# Patient Record
Sex: Male | Born: 1950
Health system: Southern US, Community
[De-identification: ages and names within clinical notes are randomized; demographics above are authoritative.]

## PROBLEM LIST (undated history)

## (undated) DIAGNOSIS — I10 Essential (primary) hypertension: Secondary | ICD-10-CM

## (undated) DIAGNOSIS — I8390 Asymptomatic varicose veins of unspecified lower extremity: Secondary | ICD-10-CM

## (undated) DIAGNOSIS — R1013 Epigastric pain: Secondary | ICD-10-CM

## (undated) DIAGNOSIS — R140 Abdominal distension (gaseous): Secondary | ICD-10-CM

## (undated) DIAGNOSIS — M159 Polyosteoarthritis, unspecified: Secondary | ICD-10-CM

## (undated) DIAGNOSIS — I700 Atherosclerosis of aorta: Secondary | ICD-10-CM

## (undated) DIAGNOSIS — R0902 Hypoxemia: Secondary | ICD-10-CM

## (undated) DIAGNOSIS — J449 Chronic obstructive pulmonary disease, unspecified: Secondary | ICD-10-CM

## (undated) DIAGNOSIS — M1990 Unspecified osteoarthritis, unspecified site: Secondary | ICD-10-CM

## (undated) HISTORY — DX: Epigastric pain: R10.13

## (undated) HISTORY — DX: Polyosteoarthritis, unspecified: M15.9

## (undated) HISTORY — DX: Atherosclerosis of aorta: I70.0

## (undated) HISTORY — DX: Unspecified osteoarthritis, unspecified site: M19.90

## (undated) HISTORY — DX: Abdominal distension (gaseous): R14.0

## (undated) HISTORY — DX: Asymptomatic varicose veins of unspecified lower extremity: I83.90

## (undated) HISTORY — DX: Chronic obstructive pulmonary disease, unspecified: J44.9

## (undated) HISTORY — DX: Hypoxemia: R09.02

## (undated) HISTORY — PX: OTHER SURGICAL HISTORY: SHX169

## (undated) HISTORY — DX: Essential (primary) hypertension: I10

---

## 2010-04-28 ENCOUNTER — Emergency Department (HOSPITAL_COMMUNITY): Payer: BC Managed Care – PPO

## 2010-04-28 ENCOUNTER — Emergency Department (HOSPITAL_COMMUNITY)
Admission: EM | Admit: 2010-04-28 | Discharge: 2010-04-28 | Disposition: A | Discharge status: Home / Self Care | Payer: BC Managed Care – PPO | Attending: Emergency Medicine | Admitting: Emergency Medicine

## 2010-04-28 DIAGNOSIS — Z7982 Long term (current) use of aspirin: Secondary | ICD-10-CM | POA: Insufficient documentation

## 2010-04-28 DIAGNOSIS — Z86718 Personal history of other venous thrombosis and embolism: Secondary | ICD-10-CM | POA: Insufficient documentation

## 2010-04-28 DIAGNOSIS — R55 Syncope and collapse: Secondary | ICD-10-CM | POA: Insufficient documentation

## 2010-04-28 DIAGNOSIS — Z79899 Other long term (current) drug therapy: Secondary | ICD-10-CM | POA: Insufficient documentation

## 2010-04-28 DIAGNOSIS — M129 Arthropathy, unspecified: Secondary | ICD-10-CM | POA: Insufficient documentation

## 2010-04-28 DIAGNOSIS — R42 Dizziness and giddiness: Secondary | ICD-10-CM | POA: Insufficient documentation

## 2010-04-28 LAB — CBC
MCH: 31.6 pg (ref 26.0–34.0)
MCHC: 35.3 g/dL (ref 30.0–36.0)
MCV: 89.7 fL (ref 78.0–100.0)
Platelets: 253 10*3/uL (ref 150–400)
RDW: 12.7 % (ref 11.5–15.5)

## 2010-04-28 LAB — POCT I-STAT, CHEM 8
BUN: 17 mg/dL (ref 6–23)
Calcium, Ion: 1.22 mmol/L (ref 1.12–1.32)
Chloride: 105 mEq/L (ref 96–112)
Glucose, Bld: 80 mg/dL (ref 70–99)
TCO2: 31 mmol/L (ref 0–100)

## 2010-04-28 LAB — DIFFERENTIAL
Basophils Relative: 0 % (ref 0–1)
Eosinophils Absolute: 0.1 10*3/uL (ref 0.0–0.7)
Eosinophils Relative: 1 % (ref 0–5)
Lymphs Abs: 1.9 10*3/uL (ref 0.7–4.0)
Monocytes Relative: 7 % (ref 3–12)

## 2011-12-21 ENCOUNTER — Institutional Professional Consult (permissible substitution): Payer: BC Managed Care – PPO | Admitting: Critical Care Medicine

## 2012-01-04 ENCOUNTER — Encounter: Payer: Self-pay | Admitting: *Deleted

## 2012-01-04 ENCOUNTER — Ambulatory Visit (INDEPENDENT_AMBULATORY_CARE_PROVIDER_SITE_OTHER)
Admission: RE | Admit: 2012-01-04 | Discharge: 2012-01-04 | Disposition: A | Discharge status: Home / Self Care | Payer: BC Managed Care – PPO | Source: Ambulatory Visit | Attending: Critical Care Medicine | Admitting: Critical Care Medicine

## 2012-01-04 ENCOUNTER — Ambulatory Visit (INDEPENDENT_AMBULATORY_CARE_PROVIDER_SITE_OTHER): Payer: BC Managed Care – PPO | Admitting: Critical Care Medicine

## 2012-01-04 VITALS — BP 130/66 | HR 73 | Temp 97.7°F | Ht 68.0 in | Wt 170.2 lb

## 2012-01-04 DIAGNOSIS — J441 Chronic obstructive pulmonary disease with (acute) exacerbation: Secondary | ICD-10-CM

## 2012-01-04 DIAGNOSIS — Z720 Tobacco use: Secondary | ICD-10-CM

## 2012-01-04 DIAGNOSIS — I8390 Asymptomatic varicose veins of unspecified lower extremity: Secondary | ICD-10-CM | POA: Insufficient documentation

## 2012-01-04 DIAGNOSIS — F17201 Nicotine dependence, unspecified, in remission: Secondary | ICD-10-CM

## 2012-01-04 DIAGNOSIS — F17200 Nicotine dependence, unspecified, uncomplicated: Secondary | ICD-10-CM

## 2012-01-04 DIAGNOSIS — J449 Chronic obstructive pulmonary disease, unspecified: Secondary | ICD-10-CM | POA: Insufficient documentation

## 2012-01-04 DIAGNOSIS — I10 Essential (primary) hypertension: Secondary | ICD-10-CM | POA: Insufficient documentation

## 2012-01-04 DIAGNOSIS — M1990 Unspecified osteoarthritis, unspecified site: Secondary | ICD-10-CM | POA: Insufficient documentation

## 2012-01-04 HISTORY — DX: Nicotine dependence, unspecified, in remission: F17.201

## 2012-01-04 MED ORDER — TIOTROPIUM BROMIDE MONOHYDRATE 18 MCG IN CAPS: 18.0000 ug | ORAL_CAPSULE | Freq: Every day | RESPIRATORY_TRACT | Status: DC

## 2012-01-04 MED ORDER — AZITHROMYCIN 250 MG PO TABS: 250.0000 mg | ORAL_TABLET | Freq: Every day | ORAL | Status: DC

## 2012-01-04 MED ORDER — PREDNISONE 10 MG PO TABS: ORAL_TABLET | ORAL | Status: DC

## 2012-01-04 MED ORDER — MOMETASONE FURO-FORMOTEROL FUM 200-5 MCG/ACT IN AERO: 2.0000 | INHALATION_SPRAY | Freq: Two times a day (BID) | RESPIRATORY_TRACT | Status: DC

## 2012-01-04 NOTE — Patient Instructions (Addendum)
Start Spiriva Stop  Advair  Start Dulera two puff twice daily Stop ceftin Start Azithromycin 250mg  Take two once then one daily until gone Chest xray today An overnight sleep oximetry will be obtained from [facility] We may need to start oxygen  Return 7days with recheck with tammy parrett our NP  Obtain flu vaccine and pneumovax on return See DR Delford Field in 6 weeks

## 2012-01-04 NOTE — Progress Notes (Addendum)
Subjective:    Patient ID: Gilbert Jefferson, male    DOB: 06/20/1950, 61 y.o.   MRN: [id number]  HPI Comments: Dx Copd x 3months, dyspnea x 1 yr  Shortness of Breath This is a chronic problem. The current episode started more than 1 year ago. The problem occurs daily (exertional only, when first awakens, up stairs). The problem has been gradually worsening. Associated symptoms include rhinorrhea, sputum production and wheezing. Pertinent negatives include no abdominal pain, chest pain, claudication, coryza, ear pain, fever, headaches, hemoptysis, leg pain, leg swelling, neck pain, orthopnea, PND, rash, sore throat, swollen glands, syncope or vomiting. The symptoms are aggravated by URIs, smoke, weather changes, any activity, exercise and lying flat. Risk factors include smoking. He has tried beta agonist inhalers and steroid inhalers (advair ) for the symptoms. The treatment provided moderate relief. His past medical history is significant for COPD. There is no history of allergies, aspirin allergies, asthma, bronchiolitis, CAD, chronic lung disease, DVT, a heart failure, PE, pneumonia or a recent surgery.   Past Medical History  Diagnosis Date  . Hypertension   . COPD (chronic obstructive pulmonary disease)   . Osteoarthrosis   . Varicose veins      No family history on file.   History   Social History  . Marital Status: Married    Spouse Name: N/A    Number of Children: 6  . Years of Education: N/A   Occupational History  . [employer]   Social History Main Topics  . Smoking status: Former Smoker -- 1.5 packs/day for 45 years    Types: Cigarettes    Quit date: 01/02/2012  . Smokeless tobacco: Never Used  . Alcohol Use: No  . Drug Use: No  . Sexually Active: Not on file   Other Topics Concern  . Not on file   Social History Narrative  . No narrative on file     No Known Allergies   No outpatient prescriptions prior to visit.    Last reviewed on  01/04/2012  3:35 PM by Storm Frisk, MD  CAT Score 01/04/2012  Total CAT Score 22        Review of Systems  Constitutional: Negative for fever, chills, diaphoresis, activity change, appetite change, fatigue and unexpected weight change.  HENT: Positive for congestion and rhinorrhea. Negative for hearing loss, ear pain, nosebleeds, sore throat, facial swelling, sneezing, mouth sores, trouble swallowing, neck pain, neck stiffness, dental problem, voice change, postnasal drip, sinus pressure, tinnitus and ear discharge.   Eyes: Negative for photophobia, discharge, itching and visual disturbance.  Respiratory: Positive for cough, sputum production, chest tightness, shortness of breath and wheezing. Negative for apnea, hemoptysis, choking and stridor.   Cardiovascular: Negative for chest pain, palpitations, orthopnea, claudication, leg swelling, syncope and PND.  Gastrointestinal: Negative for nausea, vomiting, abdominal pain, constipation, blood in stool and abdominal distention.  Genitourinary: Negative for dysuria, urgency, frequency, hematuria, flank pain, decreased urine volume and difficulty urinating.  Musculoskeletal: Positive for gait problem. Negative for myalgias, back pain, joint swelling and arthralgias.  Skin: Negative for color change, pallor and rash.  Neurological: Positive for dizziness and tremors. Negative for seizures, syncope, speech difficulty, weakness, light-headedness, numbness and headaches.  Hematological: Negative for adenopathy. Does not bruise/bleed easily.  Psychiatric/Behavioral: Negative for confusion, sleep disturbance and agitation. The patient is not nervous/anxious.        Objective:   Physical Exam Filed Vitals:   01/04/12 1517  BP:  130/66  Pulse: 73  Temp: 97.7 F (36.5 C)  TempSrc: Oral  Height: 5\' 8"  (1.727 m)  Weight: 170 lb 3.2 oz (77.202 kg)  SpO2: 95%    Gen: Pleasant, well-nourished, in no distress,  normal affect  ENT: No lesions,   mouth clear,  oropharynx clear, no postnasal drip  Neck: No JVD, no TMG, no carotid bruits  Lungs: No use of accessory muscles, no dullness to percussion, distant BS, exp wheeze  Cardiovascular: RRR, heart sounds normal, no murmur or gallops, no peripheral edema  Abdomen: soft and NT, no HSM,  BS normal  Musculoskeletal: No deformities, no cyanosis or clubbing  Neuro: alert, non focal  Skin: Warm, no lesions or rashes  CXR 12/6: copd changes  Cleda Daub 10/13:  FeV1 30%  FeV1/FVC 45%           Assessment & Plan:   COPD (chronic obstructive pulmonary disease) Golds C Copd Pt just now stopping smoking  Plan Check alpha one antitrypsin assay Start Spiriva Stop  Advair  Start Dulera two puff twice daily Stop ceftin Start Azithromycin 250mg  Take two once then one daily until gone An overnight sleep oximetry will be obtained from [facility] We may need to start oxygen  Return 7days with recheck with tammy parrett our NP  Obtain flu vaccine and pneumovax on return See DR Delford Field in 6 weeks   Tobacco abuse Pt given of intense smoking cessation counselling   Updated Medication List Outpatient Encounter Prescriptions as of 01/04/2012  Medication Sig Dispense Refill  . aspirin (BAYER ASPIRIN) 325 MG tablet Take 325 mg by mouth daily.      Marland Kitchen buPROPion (WELLBUTRIN SR) 150 MG 12 hr tablet Take 150 mg by mouth 2 (two) times daily.      Marland Kitchen etodolac (LODINE) 500 MG tablet Take 500 mg by mouth 2 (two) times daily.      . nicotine (NICODERM CQ - DOSED IN MG/24 HOURS) 21 mg/24hr patch Place 1 patch onto the skin daily.      . [DISCONTINUED] ADVAIR DISKUS 250-50 MCG/DOSE AEPB Inhale 1 puff into the lungs Twice daily.      . [DISCONTINUED] cefUROXime (CEFTIN) 250 MG tablet Take 1 tablet by mouth Twice daily.      Marland Kitchen azithromycin (ZITHROMAX) 250 MG tablet Take 1 tablet (250 mg total) by mouth daily. Take two once then one daily until gone  6 each  0  . mometasone-formoterol (DULERA) 200-5  MCG/ACT AERO Inhale 2 puffs into the lungs 2 (two) times daily.  1 Inhaler  6  . predniSONE (DELTASONE) 10 MG tablet Take 4 tablets daily for 5 days then stop  20 tablet  0  . tiotropium (SPIRIVA HANDIHALER) 18 MCG inhalation capsule Place 1 capsule (18 mcg total) into inhaler and inhale daily.  30 capsule  6

## 2012-01-06 NOTE — Assessment & Plan Note (Signed)
Pt given of intense smoking cessation counselling

## 2012-01-06 NOTE — Progress Notes (Signed)
Quick Note:  Notify the patient that the Xray is stable and no pneumonia, Just Copd changes No change in medications are recommended. Continue current meds as prescribed at last office visit ______

## 2012-01-06 NOTE — Assessment & Plan Note (Addendum)
Golds C Copd Pt just now stopping smoking  Plan Check alpha one antitrypsin assay Start Spiriva Stop  Advair  Start Dulera two puff twice daily Stop ceftin Start Azithromycin 250mg  Take two once then one daily until gone An overnight sleep oximetry will be obtained from [facility] We may need to start oxygen  Return 7days with recheck with tammy parrett our NP  Obtain flu vaccine and pneumovax on return See DR Delford Field in 6 weeks

## 2012-01-07 ENCOUNTER — Telehealth: Payer: Self-pay | Admitting: *Deleted

## 2012-01-07 NOTE — Telephone Encounter (Signed)
Called and spoke with patient, patient is aware that he needs to come in to the lab for alpha 1 testing.  Patient states that he would be able to come in 01/08/12 to have this done.  Crystal has already placed orders.  I have also given patient results of cxr. Verbalized understanding of this.  Patient had question on how to use spiriva--states he had been using it twice a day.  Informed patient that spiriva is to be used once a day.  Patient verbalized understanding of this and nothing further needed at this time.

## 2012-01-07 NOTE — Progress Notes (Signed)
Quick Note:  Spoke with patient and informed him of results/recs as listed below per Dr. Delford Field. Patient verbalized understanding and will come in for alpha 1 testing 01/08/12. Nothing further at this time. ______

## 2012-01-07 NOTE — Telephone Encounter (Signed)
Message copied by Valentino Hue on Mon Jan 07, 2012  8:47 AM ------      Message from: Storm Frisk      Created: Sun Jan 06, 2012  7:10 PM       Call th pt.  He needs to come back in for alpha one antitrypsin level blood draw d/t severe copd

## 2012-01-07 NOTE — Telephone Encounter (Signed)
Called, spoke with family member.  She will have pt return call.  Also, need to inform him of cxr results.

## 2012-01-07 NOTE — Progress Notes (Signed)
Quick Note:  Left msg with family member to have pt call office back. ______

## 2012-01-08 ENCOUNTER — Other Ambulatory Visit: Payer: BC Managed Care – PPO

## 2012-01-08 DIAGNOSIS — J441 Chronic obstructive pulmonary disease with (acute) exacerbation: Secondary | ICD-10-CM

## 2012-01-09 ENCOUNTER — Encounter: Payer: Self-pay | Admitting: Adult Health

## 2012-01-09 ENCOUNTER — Ambulatory Visit (INDEPENDENT_AMBULATORY_CARE_PROVIDER_SITE_OTHER): Payer: BC Managed Care – PPO | Admitting: Adult Health

## 2012-01-09 VITALS — BP 120/74 | HR 74 | Temp 97.2°F | Ht 68.0 in | Wt 170.8 lb

## 2012-01-09 DIAGNOSIS — J449 Chronic obstructive pulmonary disease, unspecified: Secondary | ICD-10-CM

## 2012-01-09 DIAGNOSIS — Z23 Encounter for immunization: Secondary | ICD-10-CM

## 2012-01-09 NOTE — Patient Instructions (Addendum)
Continue on  Spiriva 1 puff daily  Continue on  Dulera two puff twice daily  We will call regarding  overnight sleep oximetry   flu vaccine and pneumovax  See DR Delford Field in 6 weeks as planned and As needed   Please contact office for sooner follow up if symptoms do not improve or worsen or seek emergency care '

## 2012-01-10 DIAGNOSIS — Z23 Encounter for immunization: Secondary | ICD-10-CM

## 2012-01-14 LAB — ALPHA-1 ANTITRYPSIN PHENOTYPE: A-1 Antitrypsin: 152 mg/dL (ref 83–199)

## 2012-01-15 NOTE — Assessment & Plan Note (Signed)
Recent exacerbation   Plan  Continue on  Spiriva 1 puff daily  Continue on  Dulera two puff twice daily  We will call regarding  overnight sleep oximetry   flu vaccine and pneumovax  See DR Delford Field in 6 weeks as planned and As needed   Please contact office for sooner follow up if symptoms do not improve or worsen or seek emergency care

## 2012-01-15 NOTE — Progress Notes (Signed)
  Subjective:    Patient ID: Gilbert Jefferson, male    DOB: 02/18/1950, 61 y.o.   MRN: [id number]  HPI 61 yo male seen for initial pulmonary consult 01/04/12 for COPD  CXR 12/6: copd changes Cleda Daub 10/13:  FeV1 30%  FeV1/FVC 45%    CAT 22   01/09/12 Follow up  Pt reports he feels a lot better, dulera and spiriva working well, has not had ONO done (hasnt called) no other complaints at this time Seen last week for consult, having COPD flare . Tx w/ Zpack .  Returns today feeling much better  Started on Dulera and spiriva .     Review of Systems Constitutional:   No  weight loss, night sweats,  Fevers, chills,  +fatigue, or  lassitude.  HEENT:   No headaches,  Difficulty swallowing,  Tooth/dental problems, or  Sore throat,                No sneezing, itching, ear ache, nasal congestion, post nasal drip,   CV:  No chest pain,  Orthopnea, PND, swelling in lower extremities, anasarca, dizziness, palpitations, syncope.   GI  No heartburn, indigestion, abdominal pain, nausea, vomiting, diarrhea, change in bowel habits, loss of appetite, bloody stools.   Resp:    No coughing up of blood.     No chest wall deformity  Skin: no rash or lesions.  GU: no dysuria, change in color of urine, no urgency or frequency.  No flank pain, no hematuria   MS:  No joint pain or swelling.  No decreased range of motion.  No back pain.  Psych:  No change in mood or affect. No depression or anxiety.  No memory loss.         Objective:   Physical Exam GEN: A/Ox3; pleasant , NAD  HEENT:  [place]/AT,  EACs-clear, TMs-wnl, NOSE-clear, THROAT-clear, no lesions, no postnasal drip or exudate noted.   NECK:  Supple w/ fair ROM; no JVD; normal carotid impulses w/o bruits; no thyromegaly or nodules palpated; no lymphadenopathy.  RESP  Diminished BS in bases no accessory muscle use, no dullness to percussion  CARD:  RRR, no m/r/g  , no peripheral edema, pulses intact, no cyanosis or clubbing.  GI:   Soft & nt; nml  bowel sounds; no organomegaly or masses detected.  Musco: Warm bil, no deformities or joint swelling noted.   Neuro: alert, no focal deficits noted.    Skin: Warm, no lesions or rashes         Assessment & Plan:

## 2012-01-18 ENCOUNTER — Telehealth: Payer: Self-pay | Admitting: Critical Care Medicine

## 2012-01-18 DIAGNOSIS — J449 Chronic obstructive pulmonary disease, unspecified: Secondary | ICD-10-CM

## 2012-01-18 NOTE — Telephone Encounter (Signed)
Called, spoke with pt.  Informed him of ONO results and recs per PW.  He verbalized understanding and voiced no further questions or concerns at this time.

## 2012-01-18 NOTE — Telephone Encounter (Signed)
Call pt and tell him ONO was normal, No oxygen needed at night

## 2012-01-29 ENCOUNTER — Other Ambulatory Visit: Payer: Self-pay | Admitting: Critical Care Medicine

## 2012-01-29 DIAGNOSIS — J441 Chronic obstructive pulmonary disease with (acute) exacerbation: Secondary | ICD-10-CM

## 2012-01-29 MED ORDER — TIOTROPIUM BROMIDE MONOHYDRATE 18 MCG IN CAPS: 18.0000 ug | ORAL_CAPSULE | Freq: Every day | RESPIRATORY_TRACT | Status: DC

## 2012-01-29 NOTE — Telephone Encounter (Signed)
Pt gets ,[facility] day supply needed for  spiriva rx sent.

## 2012-02-05 ENCOUNTER — Encounter: Payer: Self-pay | Admitting: Critical Care Medicine

## 2012-02-22 ENCOUNTER — Ambulatory Visit (INDEPENDENT_AMBULATORY_CARE_PROVIDER_SITE_OTHER): Payer: BC Managed Care – PPO | Admitting: Critical Care Medicine

## 2012-02-22 ENCOUNTER — Encounter: Payer: Self-pay | Admitting: Critical Care Medicine

## 2012-02-22 VITALS — BP 134/60 | HR 72 | Temp 97.8°F | Ht 68.0 in | Wt 186.0 lb

## 2012-02-22 DIAGNOSIS — J441 Chronic obstructive pulmonary disease with (acute) exacerbation: Secondary | ICD-10-CM

## 2012-02-22 DIAGNOSIS — F17200 Nicotine dependence, unspecified, uncomplicated: Secondary | ICD-10-CM

## 2012-02-22 DIAGNOSIS — Z720 Tobacco use: Secondary | ICD-10-CM

## 2012-02-22 DIAGNOSIS — J449 Chronic obstructive pulmonary disease, unspecified: Secondary | ICD-10-CM

## 2012-02-22 MED ORDER — TIOTROPIUM BROMIDE MONOHYDRATE 18 MCG IN CAPS: ORAL_CAPSULE | RESPIRATORY_TRACT | Status: DC

## 2012-02-22 NOTE — Assessment & Plan Note (Addendum)
Chronic obstructive lung disease gold stage C.  stable at this time in improved on [facility] Plan Maintain Dulera 2 puffs twice daily Discontinue further Spiriva

## 2012-02-22 NOTE — Patient Instructions (Signed)
When current spiriva runs out: STOP Continue dulera twice daily two puffs Return in 4 months Congratulations on quitting smoking

## 2012-02-22 NOTE — Progress Notes (Signed)
Subjective:    Patient ID: Gilbert Jefferson, male    DOB: September 24, 1950, 62 y.o.   MRN: [id number]  HPI  62 y.o.  male seen for initial pulmonary consult 01/04/12 for COPD    02/22/2012 On dulera and spiriva and doing well.  No real cough.  No real wheeze.  ONO was normal on Ra Pt denies any significant sore throat, nasal congestion or excess secretions, fever, chills, sweats, unintended weight loss, pleurtic or exertional chest pain, orthopnea PND, or leg swelling Pt denies any increase in rescue therapy over baseline, denies waking up needing it or having any early am or nocturnal exacerbations of coughing/wheezing/or dyspnea. Pt also denies any obvious fluctuation in symptoms with  weather or environmental change or other alleviating or aggravating factors     Review of Systems  Constitutional:   No  weight loss, night sweats,  Fevers, chills,  +fatigue, or  lassitude.  HEENT:   No headaches,  Difficulty swallowing,  Tooth/dental problems, or  Sore throat,                No sneezing, itching, ear ache, nasal congestion, post nasal drip,   CV:  No chest pain,  Orthopnea, PND, swelling in lower extremities, anasarca, dizziness, palpitations, syncope.   GI  No heartburn, indigestion, abdominal pain, nausea, vomiting, diarrhea, change in bowel habits, loss of appetite, bloody stools.   Resp:    No coughing up of blood.     No chest wall deformity  Skin: no rash or lesions.  GU: no dysuria, change in color of urine, no urgency or frequency.  No flank pain, no hematuria   MS:  No joint pain or swelling.  No decreased range of motion.  No back pain.  Psych:  No change in mood or affect. No depression or anxiety.  No memory loss.         Objective:   Physical Exam BP 134/60  Pulse 72  Temp 97.8 F (36.6 C) (Oral)  Ht 5\' 8"  (1.727 m)  Wt 186 lb (84.369 kg)  BMI 28.28 kg/m2  SpO2 92%  GEN: A/Ox3; pleasant , NAD  HEENT:  [place]/AT,  EACs-clear, TMs-wnl, NOSE-clear, THROAT-clear,  no lesions, no postnasal drip or exudate noted.   NECK:  Supple w/ fair ROM; no JVD; normal carotid impulses w/o bruits; no thyromegaly or nodules palpated; no lymphadenopathy.  RESP  Diminished BS in bases no accessory muscle use, no dullness to percussion  CARD:  RRR, no m/r/g  , no peripheral edema, pulses intact, no cyanosis or clubbing.  GI:   Soft & nt; nml bowel sounds; no organomegaly or masses detected.  Musco: Warm bil, no deformities or joint swelling noted.   Neuro: alert, no focal deficits noted.    Skin: Warm, no lesions or rashes         Assessment & Plan:   COPD (chronic obstructive pulmonary disease) Gold C Chronic obstructive lung disease gold stage C.  stable at this time in improved on [facility] Plan Maintain Dulera 2 puffs twice daily Discontinue further Spiriva  Tobacco abuse The patient successfully quit smoking in December 2013   Updated Medication List Outpatient Encounter Prescriptions as of 02/22/2012  Medication Sig Dispense Refill  . aspirin (BAYER ASPIRIN) 325 MG tablet Take 325 mg by mouth daily.      Marland Kitchen buPROPion (WELLBUTRIN SR) 150 MG 12 hr tablet Take 150 mg by mouth 2 (two) times daily.      Marland Kitchen etodolac (LODINE) 500  MG tablet Take 500 mg by mouth 2 (two) times daily.      . mometasone-formoterol (DULERA) 200-5 MCG/ACT AERO Inhale 2 puffs into the lungs 2 (two) times daily.  1 Inhaler  6  . nicotine polacrilex (NICORETTE) 2 MG lozenge Place 2 mg inside cheek as needed.      . tiotropium (SPIRIVA HANDIHALER) 18 MCG inhalation capsule Stop Spiriva when current prescription runs out  90 capsule  1  . [DISCONTINUED] tiotropium (SPIRIVA HANDIHALER) 18 MCG inhalation capsule Place 1 capsule (18 mcg total) into inhaler and inhale daily.  90 capsule  1  . [DISCONTINUED] nicotine (NICODERM CQ - DOSED IN MG/24 HOURS) 21 mg/24hr patch Place 1 patch onto the skin daily.

## 2012-02-22 NOTE — Assessment & Plan Note (Signed)
The patient successfully quit smoking in December 2013

## 2012-04-17 ENCOUNTER — Encounter: Payer: Self-pay | Admitting: Adult Health

## 2012-04-17 ENCOUNTER — Ambulatory Visit (INDEPENDENT_AMBULATORY_CARE_PROVIDER_SITE_OTHER): Payer: BC Managed Care – PPO | Admitting: Adult Health

## 2012-04-17 VITALS — BP 160/80 | HR 88 | Temp 98.4°F | Ht 68.0 in | Wt 186.8 lb

## 2012-04-17 DIAGNOSIS — J449 Chronic obstructive pulmonary disease, unspecified: Secondary | ICD-10-CM

## 2012-04-17 MED ORDER — DOXYCYCLINE HYCLATE 100 MG PO TABS: 100.0000 mg | ORAL_TABLET | Freq: Two times a day (BID) | ORAL | Status: DC

## 2012-04-17 MED ORDER — PREDNISONE 10 MG PO TABS: ORAL_TABLET | ORAL | Status: DC

## 2012-04-17 MED ORDER — LEVALBUTEROL HCL 0.63 MG/3ML IN NEBU
0.6300 mg | INHALATION_SOLUTION | Freq: Once | RESPIRATORY_TRACT | Status: AC
  Administered 2012-04-17: 0.63 mg via RESPIRATORY_TRACT

## 2012-04-17 NOTE — Addendum Note (Signed)
Addended by: Tommie Sams on: 04/17/2012 04:30 PM   Modules accepted: Orders

## 2012-04-17 NOTE — Patient Instructions (Addendum)
Doxycycline 100 mg twice daily for 7 days. Prednisone taper over  the next week. Mucinex DM twice daily as needed. For cough, congestion. Fluids and rest. Restart Spiriva 1 puff daily  follow up Dr. Delford Field  As planned and As needed   Please contact office for sooner follow up if symptoms do not improve or worsen or seek emergency care

## 2012-04-17 NOTE — Assessment & Plan Note (Signed)
Exacerbation   Plan  Doxycycline 100 mg twice daily for 7 days. Prednisone taper over  the next week. Mucinex DM twice daily as needed. For cough, congestion. Fluids and rest. Restart Spiriva 1 puff daily  follow up Dr. Delford Field  As planned and As needed   Please contact office for sooner follow up if symptoms do not improve or worsen or seek emergency care

## 2012-04-17 NOTE — Progress Notes (Signed)
Subjective:    Patient ID: Gilbert Jefferson, male    DOB: 07/14/1950, 62 y.o.   MRN: [id number]  HPI  62 y.o.  male seen for initial pulmonary consult 01/04/12 for COPD    02/22/2012 On dulera and spiriva and doing well.  No real cough.  No real wheeze.  ONO was normal on Ra >d/c spiriva   04/17/2012 Acute OV   Complains of 2 weeks of increased cough and congestion .  Feels dyspnea worse for last 2 weeks .  Stopped spiriva few weeks ago.  No fever or discolored mucus.  Increased DOE . No edema or hemoptysis .    Review of Systems  Constitutional:   No  weight loss, night sweats,  Fevers, chills,  +fatigue, or  lassitude.  HEENT:   No headaches,  Difficulty swallowing,  Tooth/dental problems, or  Sore throat,                No sneezing, itching, ear ache, nasal congestion, post nasal drip,   CV:  No chest pain,  Orthopnea, PND, swelling in lower extremities, anasarca, dizziness, palpitations, syncope.   GI  No heartburn, indigestion, abdominal pain, nausea, vomiting, diarrhea, change in bowel habits, loss of appetite, bloody stools.   Resp:    No coughing up of blood.     No chest wall deformity  Skin: no rash or lesions.  GU: no dysuria, change in color of urine, no urgency or frequency.  No flank pain, no hematuria   MS:  No joint pain or swelling.  No decreased range of motion.  No back pain.  Psych:  No change in mood or affect. No depression or anxiety.  No memory loss.         Objective:   Physical Exam BP 160/80  Pulse 88  Temp(Src) 98.4 F (36.9 C) (Oral)  Ht 5\' 8"  (1.727 m)  Wt 186 lb 12.8 oz (84.732 kg)  BMI 28.41 kg/m2  SpO2 95%  GEN: A/Ox3; pleasant , NAD  HEENT:  [place]/AT,  EACs-clear, TMs-wnl, NOSE-clear, THROAT-clear, no lesions, no postnasal drip or exudate noted.   NECK:  Supple w/ fair ROM; no JVD; normal carotid impulses w/o bruits; no thyromegaly or nodules palpated; no lymphadenopathy.  RESP  Diminished BS in bases no accessory muscle use, no  dullness to percussion  CARD:  RRR, no m/r/g  , no peripheral edema, pulses intact, no cyanosis or clubbing.  GI:   Soft & nt; nml bowel sounds; no organomegaly or masses detected.  Musco: Warm bil, no deformities or joint swelling noted.   Neuro: alert, no focal deficits noted.    Skin: Warm, no lesions or rashes         Assessment & Plan:   COPD (chronic obstructive pulmonary disease) Gold C Exacerbation   Plan  Doxycycline 100 mg twice daily for 7 days. Prednisone taper over  the next week. Mucinex DM twice daily as needed. For cough, congestion. Fluids and rest. Restart Spiriva 1 puff daily  follow up Dr. Delford Field  As planned and As needed   Please contact office for sooner follow up if symptoms do not improve or worsen or seek emergency care      Updated Medication List Outpatient Encounter Prescriptions as of 04/17/2012  Medication Sig Dispense Refill  . aspirin (BAYER ASPIRIN) 325 MG tablet Take 325 mg by mouth daily.      Marland Kitchen etodolac (LODINE) 500 MG tablet Take 500 mg by mouth 2 (two) times daily.      Marland Kitchen  mometasone-formoterol (DULERA) 200-5 MCG/ACT AERO Inhale 2 puffs into the lungs 2 (two) times daily.  1 Inhaler  6  . nicotine polacrilex (NICORETTE) 2 MG lozenge Place 2 mg inside cheek as needed.      . tiotropium (SPIRIVA HANDIHALER) 18 MCG inhalation capsule Stop Spiriva when current prescription runs out  90 capsule  1  . buPROPion (WELLBUTRIN SR) 150 MG 12 hr tablet Take 150 mg by mouth 2 (two) times daily.      Marland Kitchen doxycycline (VIBRA-TABS) 100 MG tablet Take 1 tablet (100 mg total) by mouth 2 (two) times daily.  14 tablet  0  . predniSONE (DELTASONE) 10 MG tablet 4 tabs for 2 days, then 3 tabs for 2 days, 2 tabs for 2 days, then 1 tab for 2 days, then stop  20 tablet  0   No facility-administered encounter medications on file as of 04/17/2012.

## 2012-04-18 ENCOUNTER — Telehealth: Payer: Self-pay | Admitting: Adult Health

## 2012-04-18 DIAGNOSIS — J441 Chronic obstructive pulmonary disease with (acute) exacerbation: Secondary | ICD-10-CM

## 2012-04-18 MED ORDER — TIOTROPIUM BROMIDE MONOHYDRATE 18 MCG IN CAPS: 18.0000 ug | ORAL_CAPSULE | Freq: Every day | RESPIRATORY_TRACT | Status: DC

## 2012-04-18 MED ORDER — ALBUTEROL SULFATE HFA 108 (90 BASE) MCG/ACT IN AERS: 1.0000 | INHALATION_SPRAY | Freq: Four times a day (QID) | RESPIRATORY_TRACT | Status: DC | PRN

## 2012-04-18 NOTE — Telephone Encounter (Signed)
Patient calling back.  He is wanting 90 day supply

## 2012-04-18 NOTE — Telephone Encounter (Signed)
I spoke with spouse. She is wanting to know if we can send in a rescue inhaler for pt. Pt saw TP yesterday but is off today. Will forward to Dr. Delford Field. Please advise thanks

## 2012-04-18 NOTE — Telephone Encounter (Signed)
I spoke with spouse and is aware RX for albuterol has been sent to the pharmacy. Nothing further was needed

## 2012-04-18 NOTE — Telephone Encounter (Signed)
Ok send proventil/albuterol 1-2 puff every 6 hours prn dyspnea/wheezing

## 2012-04-18 NOTE — Telephone Encounter (Signed)
lmtcb x1 does pt want 30 or 90 day supply

## 2012-05-05 ENCOUNTER — Telehealth: Payer: Self-pay | Admitting: Critical Care Medicine

## 2012-05-05 MED ORDER — CLOTRIMAZOLE 10 MG MT TROC: 10.0000 mg | Freq: Every day | OROMUCOSAL | Status: DC

## 2012-05-05 NOTE — Telephone Encounter (Addendum)
Spoke with pt He is c/o sore mouth and white bumps on his tongue  He states mouth burns when he drinks something Was recently given doxy per TP on 3/20 and thinks it has caused thrush Please advise if ok to prescribe something for this, thanks! No Known Allergies

## 2012-05-05 NOTE — Telephone Encounter (Signed)
Mycelex troche #35  1 troche five times daily x  7 days  Please contact office for sooner follow up if symptoms do not improve or worsen or seek emergency care

## 2012-05-05 NOTE — Telephone Encounter (Signed)
Rx was sent to pharm  Pt aware and states nothing further needed 

## 2012-06-25 ENCOUNTER — Ambulatory Visit (INDEPENDENT_AMBULATORY_CARE_PROVIDER_SITE_OTHER): Payer: BC Managed Care – PPO | Admitting: Critical Care Medicine

## 2012-06-25 ENCOUNTER — Encounter: Payer: Self-pay | Admitting: Critical Care Medicine

## 2012-06-25 VITALS — BP 146/74 | HR 67 | Temp 98.5°F | Ht 68.0 in | Wt 191.8 lb

## 2012-06-25 DIAGNOSIS — J449 Chronic obstructive pulmonary disease, unspecified: Secondary | ICD-10-CM

## 2012-06-25 NOTE — Progress Notes (Signed)
Subjective:    Patient ID: Gilbert Jefferson, male    DOB: 30-Nov-1950, 63 y.o.   MRN: [id number]  HPI  62 y.o.  male seen for initial pulmonary consult 01/04/12 for COPD   06/25/2012 Flare in 03/2012, now is better.  No smoking since 12/14. No real cough , no wheezing.  No real dyspnea Gained 18# since 12/14 Pt denies any significant sore throat, nasal congestion or excess secretions, fever, chills, sweats, unintended weight loss, pleurtic or exertional chest pain, orthopnea PND, or leg swelling Pt denies any increase in rescue therapy over baseline, denies waking up needing it or having any early am or nocturnal exacerbations of coughing/wheezing/or dyspnea. Pt also denies any obvious fluctuation in symptoms with  weather or environmental change or other alleviating or aggravating factors    Review of Systems  Constitutional:   No  weight loss, night sweats,  Fevers, chills,  +fatigue, or  lassitude.  HEENT:   No headaches,  Difficulty swallowing,  Tooth/dental problems, or  Sore throat,                No sneezing, itching, ear ache, nasal congestion, post nasal drip,   CV:  No chest pain,  Orthopnea, PND, swelling in lower extremities, anasarca, dizziness, palpitations, syncope.   GI  No heartburn, indigestion, abdominal pain, nausea, vomiting, diarrhea, change in bowel habits, loss of appetite, bloody stools.   Resp:    No coughing up of blood.     No chest wall deformity  Skin: no rash or lesions.  GU: no dysuria, change in color of urine, no urgency or frequency.  No flank pain, no hematuria   MS:  No joint pain or swelling.  No decreased range of motion.  No back pain.  Psych:  No change in mood or affect. No depression or anxiety.  No memory loss.         Objective:   Physical Exam BP 146/74  Pulse 67  Temp(Src) 98.5 F (36.9 C) (Oral)  Ht 5\' 8"  (1.727 m)  Wt 191 lb 12.8 oz (87 kg)  BMI 29.17 kg/m2  SpO2 97%  GEN: A/Ox3; pleasant , NAD  HEENT:  [place]/AT,   EACs-clear, TMs-wnl, NOSE-clear, THROAT-clear, no lesions, no postnasal drip or exudate noted.   NECK:  Supple w/ fair ROM; no JVD; normal carotid impulses w/o bruits; no thyromegaly or nodules palpated; no lymphadenopathy.  RESP  Diminished BS in bases no accessory muscle use, no dullness to percussion  CARD:  RRR, no m/r/g  , no peripheral edema, pulses intact, no cyanosis or clubbing.  GI:   Soft & nt; nml bowel sounds; no organomegaly or masses detected.  Musco: Warm bil, no deformities or joint swelling noted.   Neuro: alert, no focal deficits noted.    Skin: Warm, no lesions or rashes         Assessment & Plan:   COPD (chronic obstructive pulmonary disease) Gold C Golds C Copd,  Note ONO on RA normal Plan No change in inhaled or maintenance medications. Return in  6 months     Updated Medication List Outpatient Encounter Prescriptions as of 06/25/2012  Medication Sig Dispense Refill  . albuterol (PROVENTIL HFA;VENTOLIN HFA) 108 (90 BASE) MCG/ACT inhaler Inhale 1-2 puffs into the lungs every 6 (six) hours as needed for wheezing or shortness of breath.  1 Inhaler  5  . aspirin (BAYER ASPIRIN) 325 MG tablet Take 325 mg by mouth daily.      Marland Kitchen buPROPion (  WELLBUTRIN SR) 150 MG 12 hr tablet Take 150 mg by mouth 2 (two) times daily.      Marland Kitchen etodolac (LODINE) 500 MG tablet Take 500 mg by mouth 2 (two) times daily.      . mometasone-formoterol (DULERA) 200-5 MCG/ACT AERO Inhale 2 puffs into the lungs 2 (two) times daily.  1 Inhaler  6  . nicotine polacrilex (NICORETTE) 2 MG lozenge Place 2 mg inside cheek as needed.      . tiotropium (SPIRIVA HANDIHALER) 18 MCG inhalation capsule Place 1 capsule (18 mcg total) into inhaler and inhale daily.  90 capsule  1  . [DISCONTINUED] clotrimazole (MYCELEX) 10 MG troche Take 1 tablet (10 mg total) by mouth 5 (five) times daily.  35 tablet  0  . [DISCONTINUED] doxycycline (VIBRA-TABS) 100 MG tablet Take 1 tablet (100 mg total) by mouth 2  (two) times daily.  14 tablet  0  . [DISCONTINUED] predniSONE (DELTASONE) 10 MG tablet 4 tabs for 2 days, then 3 tabs for 2 days, 2 tabs for 2 days, then 1 tab for 2 days, then stop  20 tablet  0   No facility-administered encounter medications on file as of 06/25/2012.

## 2012-06-25 NOTE — Patient Instructions (Signed)
Focus on diet  Stay on Dulera and Spiriva REturn 3 months

## 2012-06-26 NOTE — Assessment & Plan Note (Signed)
Golds C Copd,  Note ONO on RA normal Plan No change in inhaled or maintenance medications. Return in  6 months

## 2012-08-27 ENCOUNTER — Telehealth: Payer: Self-pay | Admitting: Critical Care Medicine

## 2012-08-27 NOTE — Telephone Encounter (Signed)
Called patient x3 and lm for return appointment. No return call back. Sent letter 08/27/12. °

## 2012-10-08 ENCOUNTER — Encounter: Payer: Self-pay | Admitting: Critical Care Medicine

## 2012-10-08 ENCOUNTER — Ambulatory Visit (INDEPENDENT_AMBULATORY_CARE_PROVIDER_SITE_OTHER): Payer: BC Managed Care – PPO | Admitting: Critical Care Medicine

## 2012-10-08 VITALS — BP 166/86 | HR 81 | Temp 97.0°F | Ht 68.0 in | Wt 198.8 lb

## 2012-10-08 DIAGNOSIS — Z23 Encounter for immunization: Secondary | ICD-10-CM

## 2012-10-08 DIAGNOSIS — J449 Chronic obstructive pulmonary disease, unspecified: Secondary | ICD-10-CM

## 2012-10-08 NOTE — Patient Instructions (Addendum)
No change in medications. Return in     6 months A flu vaccine was given

## 2012-10-09 NOTE — Progress Notes (Signed)
Subjective:    Patient ID: Gilbert Jefferson, male    DOB: [birth year], 62 y.o.   MRN: [id number]  HPI  62 y.o.  male seen for initial pulmonary consult 01/04/12 for COPD  10/08/12 Chief Complaint  Patient presents with  . 3 month follow up    Breathing doing well overall.  Does have DOE.  No wheezing, chest tightness, chest pain, or cough at this time.   No new issues, pt is stable at present.  Pt denies any significant sore throat, nasal congestion or excess secretions, fever, chills, sweats, unintended weight loss, pleurtic or exertional chest pain, orthopnea PND, or leg swelling Pt denies any increase in rescue therapy over baseline, denies waking up needing it or having any early am or nocturnal exacerbations of coughing/wheezing/or dyspnea. Pt also denies any obvious fluctuation in symptoms with  weather or environmental change or other alleviating or aggravating factors    Review of Systems  Constitutional:   No  weight loss, night sweats,  Fevers, chills,  +fatigue, or  lassitude.  HEENT:   No headaches,  Difficulty swallowing,  Tooth/dental problems, or  Sore throat,                No sneezing, itching, ear ache, nasal congestion, post nasal drip,   CV:  No chest pain,  Orthopnea, PND, swelling in lower extremities, anasarca, dizziness, palpitations, syncope.   GI  No heartburn, indigestion, abdominal pain, nausea, vomiting, diarrhea, change in bowel habits, loss of appetite, bloody stools.   Resp:    No coughing up of blood.     No chest wall deformity  Skin: no rash or lesions.  GU: no dysuria, change in color of urine, no urgency or frequency.  No flank pain, no hematuria   MS:  No joint pain or swelling.  No decreased range of motion.  No back pain.  Psych:  No change in mood or affect. No depression or anxiety.  No memory loss.         Objective:   Physical Exam BP 166/86  Pulse 81  Temp(Src) 97 F (36.1 C) (Oral)  Ht 5\' 8"  (1.727 m)  Wt 198 lb 12.8 oz  (90.175 kg)  BMI 30.23 kg/m2  SpO2 98%  GEN: A/Ox3; pleasant , NAD  HEENT:  [place]/AT,  EACs-clear, TMs-wnl, NOSE-clear, THROAT-clear, no lesions, no postnasal drip or exudate noted.   NECK:  Supple w/ fair ROM; no JVD; normal carotid impulses w/o bruits; no thyromegaly or nodules palpated; no lymphadenopathy.  RESP  Diminished BS in bases no accessory muscle use, no dullness to percussion  CARD:  RRR, no m/r/g  , no peripheral edema, pulses intact, no cyanosis or clubbing.  GI:   Soft & nt; nml bowel sounds; no organomegaly or masses detected.  Musco: Warm bil, no deformities or joint swelling noted.   Neuro: alert, no focal deficits noted.    Skin: Warm, no lesions or rashes         Assessment & Plan:   COPD (chronic obstructive pulmonary disease) Gold C Stable copd golds C Plan Cont inhaled meds Flu vaccine    Updated Medication List Outpatient Encounter Prescriptions as of 10/08/2012  Medication Sig Dispense Refill  . albuterol (PROVENTIL HFA;VENTOLIN HFA) 108 (90 BASE) MCG/ACT inhaler Inhale 1-2 puffs into the lungs every 6 (six) hours as needed for wheezing or shortness of breath.  1 Inhaler  5  . aspirin 81 MG tablet Take 81 mg by mouth daily.      Marland Kitchen  buPROPion (WELLBUTRIN SR) 150 MG 12 hr tablet Take 150 mg by mouth 2 (two) times daily.      Marland Kitchen etodolac (LODINE) 500 MG tablet Take 500 mg by mouth 2 (two) times daily.      . mometasone-formoterol (DULERA) 200-5 MCG/ACT AERO Inhale 2 puffs into the lungs 2 (two) times daily.  1 Inhaler  6  . nicotine polacrilex (NICORETTE) 2 MG lozenge Place 2 mg inside cheek as needed.      . tiotropium (SPIRIVA HANDIHALER) 18 MCG inhalation capsule Place 1 capsule (18 mcg total) into inhaler and inhale daily.  90 capsule  1  . [DISCONTINUED] aspirin (BAYER ASPIRIN) 325 MG tablet Take 325 mg by mouth daily.       No facility-administered encounter medications on file as of 10/08/2012.

## 2012-10-09 NOTE — Assessment & Plan Note (Signed)
Stable copd golds C Plan Cont inhaled meds Flu vaccine

## 2012-11-21 ENCOUNTER — Telehealth: Payer: Self-pay | Admitting: Critical Care Medicine

## 2012-11-21 NOTE — Telephone Encounter (Signed)
Samples are up front for pick up. Pt's wife is aware. 

## 2013-01-30 ENCOUNTER — Telehealth: Payer: Self-pay | Admitting: Critical Care Medicine

## 2013-01-30 MED ORDER — MOMETASONE FURO-FORMOTEROL FUM 200-5 MCG/ACT IN AERO: 2.0000 | INHALATION_SPRAY | Freq: Two times a day (BID) | RESPIRATORY_TRACT | Status: DC

## 2013-01-30 NOTE — Telephone Encounter (Signed)
2 samples of the dulera have been left up front and the pt is aware and will send his wife by here to pick these up.  Nothing further is needed.

## 2013-02-16 ENCOUNTER — Telehealth: Payer: Self-pay | Admitting: Critical Care Medicine

## 2013-02-16 NOTE — Telephone Encounter (Signed)
Appointment has been scheduled with MW on 02/18/13 at 4:15pm.

## 2013-02-16 NOTE — Telephone Encounter (Signed)
Needs work in appt, [facility]

## 2013-02-16 NOTE — Telephone Encounter (Signed)
Spoke with pt. Reports coughing with production of green mucus, chest tightness, chest congestion and SOB x1 week. Has been using Mucinex with minimal relief. Denies fever/chills. Would like PW's recs.  PW - please advise. Thanks.

## 2013-02-18 ENCOUNTER — Ambulatory Visit (INDEPENDENT_AMBULATORY_CARE_PROVIDER_SITE_OTHER)
Admission: RE | Admit: 2013-02-18 | Discharge: 2013-02-18 | Disposition: A | Discharge status: Home / Self Care | Payer: BC Managed Care – PPO | Source: Ambulatory Visit | Attending: Internal Medicine | Admitting: Internal Medicine

## 2013-02-18 ENCOUNTER — Encounter: Payer: Self-pay | Admitting: Internal Medicine

## 2013-02-18 ENCOUNTER — Ambulatory Visit (INDEPENDENT_AMBULATORY_CARE_PROVIDER_SITE_OTHER): Payer: BC Managed Care – PPO | Admitting: Internal Medicine

## 2013-02-18 VITALS — BP 150/82 | HR 84 | Temp 98.8°F | Ht 68.0 in | Wt 202.0 lb

## 2013-02-18 DIAGNOSIS — J449 Chronic obstructive pulmonary disease, unspecified: Secondary | ICD-10-CM

## 2013-02-18 MED ORDER — PREDNISONE 10 MG PO TABS: ORAL_TABLET | ORAL | Status: DC

## 2013-02-18 NOTE — Patient Instructions (Addendum)
Prednisone 10 mg take  4 each am x 2 days,   2 each am x 2 days,  1 each am x 2 days and stop   For dry cough take delsym 2 tsp every 12 hours and pepcid 20 mg one at bedtime   No mint or menthol products or cough drops  Keep follow up appts with Dr Joya Gaskins  Late add  cxr not in system from 02/18/13

## 2013-02-18 NOTE — Progress Notes (Signed)
  Subjective:    Patient ID: Gilbert Jefferson, male    DOB: 06/09/1950, 63 y.o.   MRN: [id number]  HPI  63 y.o. Quit smoking 12/2011  male seen for initial pulmonary consult 01/04/12 for COPD GOLD IV criteria 09/2012    02/18/2013 acute  ov/[name] re: aecopd maintained on spiriva/dulera 200  not using  saba at all at baseline Chief Complaint  Patient presents with  . Acute Visit    Pt c/o non prod cough and chest tightness x 1 wk. Cough esp worse at night.   not limited by sob but not very active - has not tried saba for this flare "it's for emergencies"  No obvious day to day or daytime variabilty or assoc cp or chest tightness, subjective wheeze overt sinus or hb symptoms. No unusual exp hx or h/o childhood pna/ asthma or knowledge of premature birth.  Sleeping ok without nocturnal  or early am exacerbation  of respiratory  c/o's or need for noct saba. Also denies any obvious fluctuation of symptoms with weather or environmental changes or other aggravating or alleviating factors except as outlined above   Current Medications, Allergies, Complete Past Medical History, Past Surgical History, Family History, and Social History were reviewed in [employer] record.  ROS  The following are not active complaints unless bolded sore throat, dysphagia, dental problems, itching, sneezing,  nasal congestion or excess/ purulent secretions, ear ache,   fever, chills, sweats, unintended wt loss, pleuritic or exertional cp, hemoptysis,  orthopnea pnd or leg swelling, presyncope, palpitations, heartburn, abdominal pain, anorexia, nausea, vomiting, diarrhea  or change in bowel or urinary habits, change in stools or urine, dysuria,hematuria,  rash, arthralgias, visual complaints, headache, numbness weakness or ataxia or problems with walking or coordination,  change in mood/affect or memory.             Objective:   Physical Exam    Wt Readings from Last 3 Encounters:  02/18/13 202 lb  (91.627 kg)  10/08/12 198 lb 12.8 oz (90.175 kg)  06/25/12 191 lb 12.8 oz (87 kg)      GEN: A/Ox3; pleasant , NAD  HEENT:  [place]/AT,  EACs-clear, TMs-wnl, NOSE-clear, THROAT-clear, no lesions, no postnasal drip or exudate noted.   NECK:  Supple w/ fair ROM; no JVD; normal carotid impulses w/o bruits; no thyromegaly or nodules palpated; no lymphadenopathy.  RESP  Very distant bs/ hyper resonant to percussion  CARD:  RRR, no m/r/g  , no peripheral edema, pulses intact, no cyanosis or clubbing.  GI:   Soft & nt; nml bowel sounds; no organomegaly or masses detected.  Musco: Warm bil, no deformities or joint swelling noted.   Neuro: alert, no focal deficits noted.    Skin: Warm, no lesions or rashes    cxr done 01/31/19/15  but not in system     Assessment & Plan:

## 2013-02-19 NOTE — Progress Notes (Signed)
Quick Note:  Spoke with pt and notified of results per Dr. Wert. Pt verbalized understanding and denied any questions.  ______ 

## 2013-02-19 NOTE — Assessment & Plan Note (Signed)
01/18/2012 ONO RA normal Arlyce Harman 10/08/12: FeV1 29% FeV1/FVC 41%  ? Flare of copd vs unrelated cough from upper airway cough syndrome.  rec short cycle of prednisone and at least add Pepcid 20 mg at hs to cover ? Noct gerd causing cough    Each maintenance medication was reviewed in detail including most importantly the difference between maintenance and as needed and under what circumstances the prns are to be used.  Please see instructions for details which were reviewed in writing and the patient given a copy.

## 2013-04-10 ENCOUNTER — Ambulatory Visit: Payer: BC Managed Care – PPO | Admitting: Critical Care Medicine

## 2013-04-10 ENCOUNTER — Ambulatory Visit (INDEPENDENT_AMBULATORY_CARE_PROVIDER_SITE_OTHER): Payer: BC Managed Care – PPO | Admitting: Critical Care Medicine

## 2013-04-10 ENCOUNTER — Encounter: Payer: Self-pay | Admitting: Critical Care Medicine

## 2013-04-10 ENCOUNTER — Other Ambulatory Visit: Payer: BC Managed Care – PPO

## 2013-04-10 VITALS — BP 162/88 | HR 84 | Temp 97.0°F | Ht 68.0 in | Wt 203.2 lb

## 2013-04-10 DIAGNOSIS — J441 Chronic obstructive pulmonary disease with (acute) exacerbation: Secondary | ICD-10-CM

## 2013-04-10 DIAGNOSIS — J9610 Chronic respiratory failure, unspecified whether with hypoxia or hypercapnia: Secondary | ICD-10-CM

## 2013-04-10 DIAGNOSIS — J449 Chronic obstructive pulmonary disease, unspecified: Secondary | ICD-10-CM

## 2013-04-10 DIAGNOSIS — J9611 Chronic respiratory failure with hypoxia: Secondary | ICD-10-CM

## 2013-04-10 DIAGNOSIS — J4489 Other specified chronic obstructive pulmonary disease: Secondary | ICD-10-CM

## 2013-04-10 HISTORY — DX: Chronic respiratory failure with hypoxia: J96.11

## 2013-04-10 MED ORDER — AZITHROMYCIN 250 MG PO TABS: 250.0000 mg | ORAL_TABLET | Freq: Every day | ORAL | Status: DC

## 2013-04-10 MED ORDER — PREDNISONE 10 MG PO TABS: ORAL_TABLET | ORAL | Status: DC

## 2013-04-10 MED ORDER — MOMETASONE FURO-FORMOTEROL FUM 200-5 MCG/ACT IN AERO: 2.0000 | INHALATION_SPRAY | Freq: Two times a day (BID) | RESPIRATORY_TRACT | Status: DC

## 2013-04-10 MED ORDER — BUPROPION HCL ER (XL) 150 MG PO TB24: 150.0000 mg | ORAL_TABLET | Freq: Every day | ORAL | Status: DC

## 2013-04-10 MED ORDER — TIOTROPIUM BROMIDE MONOHYDRATE 18 MCG IN CAPS: 18.0000 ug | ORAL_CAPSULE | Freq: Every day | RESPIRATORY_TRACT | Status: DC

## 2013-04-10 NOTE — Progress Notes (Signed)
Subjective:    Patient ID: Gilbert Jefferson, male    DOB: 1950-11-25, 63 y.o.   MRN: [id number]  HPI 63 y.o. Quit smoking 12/2011  male seen for initial pulmonary consult 01/04/12 for COPD GOLD IV criteria 09/2012   04/10/2013 Chief Complaint  Patient presents with  . Follow-up    Breathing unchanged since OV with MW.  Increased SOB, prod cough with green mucus, and some wheezing qhs x 1 wk.  No chest tightness or f/c/s.  rx pred and pepcid per MW at 01/2013 OV Pt was doing ok until 1 week ago and now green in AM and more wheezing.  No chest pain No smoking since 2013  Pt will occasionally use nicorette Interested in wellbutrin Having difficulties with pulling auto materials  Current Medications, Allergies, Complete Past Medical History, Past Surgical History, Family History, and Social History were reviewed in [employer] record.  Review of Systems Ros neg except as above    Objective:   Physical Exam Filed Vitals:   04/10/13 1334  BP: 162/88  Pulse: 84  Temp: 97 F (36.1 C)  TempSrc: Oral  Height: 5\' 8"  (1.727 m)  Weight: 203 lb 3.2 oz (92.171 kg)  SpO2: 94%    Gen: Pleasant, well-nourished, in no distress,  normal affect  ENT: No lesions,  mouth clear,  oropharynx clear, no postnasal drip  Neck: No JVD, no TMG, no carotid bruits  Lungs: No use of accessory muscles, no dullness to percussion, distant BS  Cardiovascular: RRR, heart sounds normal, no murmur or gallops, no peripheral edema  Abdomen: soft and NT, no HSM,  BS normal  Musculoskeletal: No deformities, no cyanosis or clubbing  Neuro: alert, non focal  Skin: Warm, no lesions or rashes  No results found.        Assessment & Plan:   COPD (chronic obstructive pulmonary disease) Gold D Gold D copd progressive Plan ake azithromycin 250mg  Take two once then one daily until gone Take prednisone 10mg  Take 4 tablets daily for 5 days then stop Stay on dulera and spiriva Start  Wellbutrin 150mg  daily Labs to check alpha one antitrypsin assay Process disability, you will qualify due to severity of lung disease Start oxygen 2Liter rest/ exertion Return 2 months     Updated Medication List Outpatient Encounter Prescriptions as of 04/10/2013  Medication Sig  . albuterol (PROVENTIL HFA;VENTOLIN HFA) 108 (90 BASE) MCG/ACT inhaler Inhale 1-2 puffs into the lungs every 6 (six) hours as needed for wheezing or shortness of breath.  . mometasone-formoterol (DULERA) 200-5 MCG/ACT AERO Inhale 2 puffs into the lungs 2 (two) times daily.  . naproxen sodium (ANAPROX) 220 MG tablet Take 220 mg by mouth 2 (two) times daily with a meal.  . nicotine polacrilex (NICORETTE) 2 MG lozenge Place 2 mg inside cheek as needed.  . tiotropium (SPIRIVA HANDIHALER) 18 MCG inhalation capsule Place 1 capsule (18 mcg total) into inhaler and inhale daily.  . [DISCONTINUED] mometasone-formoterol (DULERA) 200-5 MCG/ACT AERO Inhale 2 puffs into the lungs 2 (two) times daily.  . [DISCONTINUED] tiotropium (SPIRIVA HANDIHALER) 18 MCG inhalation capsule Place 1 capsule (18 mcg total) into inhaler and inhale daily.  Marland Kitchen azithromycin (ZITHROMAX) 250 MG tablet Take 1 tablet (250 mg total) by mouth daily. Take two once then one daily until gone  . buPROPion (WELLBUTRIN XL) 150 MG 24 hr tablet Take 1 tablet (150 mg total) by mouth daily.  . predniSONE (DELTASONE) 10 MG tablet Take 4 tablets daily for  5 days then stop  . [DISCONTINUED] aspirin 81 MG tablet Take 81 mg by mouth daily.  . [DISCONTINUED] buPROPion (WELLBUTRIN SR) 150 MG 12 hr tablet Take 150 mg by mouth 2 (two) times daily.  . [DISCONTINUED] predniSONE (DELTASONE) 10 MG tablet Take  4 each am x 2 days,   2 each am x 2 days,  1 each am x 2 days and stop

## 2013-04-10 NOTE — Assessment & Plan Note (Signed)
Gold D copd progressive Plan ake azithromycin 250mg  Take two once then one daily until gone Take prednisone 10mg  Take 4 tablets daily for 5 days then stop Stay on dulera and spiriva Start Wellbutrin 150mg  daily Labs to check alpha one antitrypsin assay Process disability, you will qualify due to severity of lung disease Start oxygen 2Liter rest/ exertion Return 2 months

## 2013-04-10 NOTE — Patient Instructions (Addendum)
Take azithromycin 250mg  Take two once then one daily until gone Take prednisone 10mg  Take 4 tablets daily for 5 days then stop Stay on dulera and spiriva Start Wellbutrin 150mg  daily Labs to check alpha one antitrypsin assay Process disability, you will qualify due to severity of lung disease Start oxygen 2Liter rest/ exertion Return 2 months

## 2013-04-11 ENCOUNTER — Encounter: Payer: Self-pay | Admitting: Critical Care Medicine

## 2013-04-13 LAB — ALPHA-1-ANTITRYPSIN: A1 ANTITRYPSIN SER: 148 mg/dL (ref 83–199)

## 2013-04-29 ENCOUNTER — Telehealth: Payer: Self-pay | Admitting: Critical Care Medicine

## 2013-04-29 DIAGNOSIS — J449 Chronic obstructive pulmonary disease, unspecified: Secondary | ICD-10-CM

## 2013-04-29 NOTE — Telephone Encounter (Signed)
Order has been placed.

## 2013-04-29 NOTE — Telephone Encounter (Signed)
Is it okay to order a poc for pt? Please advise Dr. Joya Gaskins thanks

## 2013-04-29 NOTE — Telephone Encounter (Signed)
Yes this is fine.

## 2013-05-07 ENCOUNTER — Telehealth: Payer: Self-pay | Admitting: Critical Care Medicine

## 2013-05-07 NOTE — Telephone Encounter (Signed)
Results were NORMAL

## 2013-05-07 NOTE — Telephone Encounter (Signed)
Called made pt aware. Nothing further needed 

## 2013-05-07 NOTE — Telephone Encounter (Signed)
Called spoke with pt. He is requesting A1A test results from 04/10/13. Please advise Dr. Joya Gaskins thanks

## 2013-06-16 ENCOUNTER — Telehealth: Payer: Self-pay | Admitting: Critical Care Medicine

## 2013-06-16 MED ORDER — PREDNISONE 10 MG PO TABS: ORAL_TABLET | ORAL | Status: DC

## 2013-06-16 MED ORDER — AZITHROMYCIN 250 MG PO TABS: 250.0000 mg | ORAL_TABLET | Freq: Every day | ORAL | Status: DC

## 2013-06-16 NOTE — Telephone Encounter (Signed)
Call in :  Prednisone 10mg  Take 4 tablets daily for 5 days then stop #20 Azithromycin 250mg  Take two once then one daily until gone #6 OV if no better

## 2013-06-16 NOTE — Telephone Encounter (Signed)
Rxs were sent  I spoke with the pt's spouse and notified her  She will inform the pt

## 2013-06-16 NOTE — Telephone Encounter (Signed)
Spoke with the pt  He is c/o prod cough with minimal yellow sputum x 3 days  He started to have slight increase in SOB and minimal wheezing last night  No fever, CP or other co's Would like something called in Please advise, thanks!

## 2013-07-01 ENCOUNTER — Encounter: Payer: Self-pay | Admitting: Critical Care Medicine

## 2013-07-01 ENCOUNTER — Ambulatory Visit (INDEPENDENT_AMBULATORY_CARE_PROVIDER_SITE_OTHER): Payer: BC Managed Care – PPO | Admitting: Critical Care Medicine

## 2013-07-01 VITALS — BP 142/80 | HR 66 | Temp 98.1°F | Ht 68.0 in | Wt 202.2 lb

## 2013-07-01 DIAGNOSIS — J441 Chronic obstructive pulmonary disease with (acute) exacerbation: Secondary | ICD-10-CM

## 2013-07-01 DIAGNOSIS — J449 Chronic obstructive pulmonary disease, unspecified: Secondary | ICD-10-CM

## 2013-07-01 MED ORDER — TIOTROPIUM BROMIDE MONOHYDRATE 18 MCG IN CAPS: 18.0000 ug | ORAL_CAPSULE | Freq: Every day | RESPIRATORY_TRACT | Status: DC

## 2013-07-01 NOTE — Progress Notes (Signed)
   Subjective:    Patient ID: Gilbert Jefferson, male    DOB: 19-Mar-1950, 63 y.o.   MRN: [id number]  HPI  63 y.o. Quit smoking 12/2011  male seen for initial pulmonary consult 01/04/12 for COPD GOLD IV criteria 09/2012   07/01/2013 Chief Complaint  Patient presents with  . 2 month follow up    Pt states he is wearing nocturnal O2 and states he wears it with activity. Pt is without O2 today. C/o of DOE especially with humidtiy. Denies cough and CP/tightness.   Pt doing better.  No real cough. Notes some wheezes.  Uses oxygen at night.  No real chest pain.  Pt now retired. Short term disability.  On spiriva alone. SABA prn.   Current Medications, Allergies, Complete Past Medical History, Past Surgical History, Family History, and Social History were reviewed in [employer] record.  Review of Systems  Ros neg except as above    Objective:   Physical Exam  Filed Vitals:   07/01/13 0946  BP: 142/80  Pulse: 66  Temp: 98.1 F (36.7 C)  TempSrc: Oral  Height: 5\' 8"  (1.727 m)  Weight: 202 lb 3.2 oz (91.717 kg)  SpO2: 99%    Gen: Pleasant, well-nourished, in no distress,  normal affect  ENT: No lesions,  mouth clear,  oropharynx clear, no postnasal drip  Neck: No JVD, no TMG, no carotid bruits  Lungs: No use of accessory muscles, no dullness to percussion, distant BS  Cardiovascular: RRR, heart sounds normal, no murmur or gallops, no peripheral edema  Abdomen: soft and NT, no HSM,  BS normal  Musculoskeletal: No deformities, no cyanosis or clubbing  Neuro: alert, non focal  Skin: Warm, no lesions or rashes  No results found.     Assessment & Plan:   COPD (chronic obstructive pulmonary disease) Gold D Gold stage D. COPD with frequent exacerbations now stable Note alpha-1 antitrypsin level was normal Plan Maintain inhaled medications as currently prescribed Maintain oxygen therapy as prescribed Give consideration for referral to the impact research  trial    Updated Medication List Outpatient Encounter Prescriptions as of 07/01/2013  Medication Sig  . albuterol (PROVENTIL HFA;VENTOLIN HFA) 108 (90 BASE) MCG/ACT inhaler Inhale 1-2 puffs into the lungs every 6 (six) hours as needed for wheezing or shortness of breath.  . naproxen sodium (ANAPROX) 220 MG tablet Take 220 mg by mouth 2 (two) times daily with a meal.  . nicotine polacrilex (NICORETTE) 2 MG lozenge Place 2 mg inside cheek as needed.  . tiotropium (SPIRIVA HANDIHALER) 18 MCG inhalation capsule Place 1 capsule (18 mcg total) into inhaler and inhale daily.  . [DISCONTINUED] tiotropium (SPIRIVA HANDIHALER) 18 MCG inhalation capsule Place 1 capsule (18 mcg total) into inhaler and inhale daily.  Marland Kitchen buPROPion (WELLBUTRIN XL) 150 MG 24 hr tablet Take 1 tablet (150 mg total) by mouth daily.  . mometasone-formoterol (DULERA) 200-5 MCG/ACT AERO Inhale 2 puffs into the lungs 2 (two) times daily.  . [DISCONTINUED] azithromycin (ZITHROMAX) 250 MG tablet Take 1 tablet (250 mg total) by mouth daily. Take two once then one daily until gone  . [DISCONTINUED] predniSONE (DELTASONE) 10 MG tablet Take 4 tablets daily for 5 days then stop

## 2013-07-01 NOTE — Patient Instructions (Signed)
No change in medications. Return in       4 months We will screen you for the inhaler trial, Gilbert Jefferson may call you

## 2013-07-02 NOTE — Assessment & Plan Note (Signed)
Gold stage D. COPD with frequent exacerbations now stable Note alpha-1 antitrypsin level was normal Plan Maintain inhaled medications as currently prescribed Maintain oxygen therapy as prescribed Give consideration for referral to the impact research trial

## 2013-07-09 ENCOUNTER — Other Ambulatory Visit: Payer: Self-pay | Admitting: *Deleted

## 2013-07-09 MED ORDER — ALBUTEROL SULFATE HFA 108 (90 BASE) MCG/ACT IN AERS: 1.0000 | INHALATION_SPRAY | Freq: Four times a day (QID) | RESPIRATORY_TRACT | Status: DC | PRN

## 2013-07-14 ENCOUNTER — Other Ambulatory Visit: Payer: Self-pay | Admitting: *Deleted

## 2013-07-14 MED ORDER — ALBUTEROL SULFATE HFA 108 (90 BASE) MCG/ACT IN AERS: 1.0000 | INHALATION_SPRAY | Freq: Four times a day (QID) | RESPIRATORY_TRACT | Status: DC | PRN

## 2013-07-14 NOTE — Telephone Encounter (Signed)
Received fax from [place] requesting to change proventil hfa to proair hfa as proair is the covered alternative. Proair rx sent and MAR updated.

## 2013-09-03 ENCOUNTER — Other Ambulatory Visit: Payer: Self-pay | Admitting: Pulmonary Disease

## 2013-09-03 MED ORDER — BUPROPION HCL ER (XL) 150 MG PO TB24
150.0000 mg | ORAL_TABLET | Freq: Every day | ORAL | Status: DC
Start: 2013-09-03 — End: 2013-10-23

## 2013-09-29 ENCOUNTER — Telehealth: Payer: Self-pay | Admitting: Critical Care Medicine

## 2013-09-29 NOTE — Telephone Encounter (Signed)
Received an "Attending Physician Statement from" from [facility] from [place]. Form completed and signed by Dr. Joya Gaskins and sent back to [facility].

## 2013-10-23 ENCOUNTER — Ambulatory Visit (INDEPENDENT_AMBULATORY_CARE_PROVIDER_SITE_OTHER): Payer: BC Managed Care – PPO | Admitting: Critical Care Medicine

## 2013-10-23 ENCOUNTER — Encounter: Payer: Self-pay | Admitting: Critical Care Medicine

## 2013-10-23 VITALS — BP 132/76 | HR 73 | Temp 98.1°F | Ht 68.0 in | Wt 184.2 lb

## 2013-10-23 DIAGNOSIS — J432 Centrilobular emphysema: Secondary | ICD-10-CM

## 2013-10-23 DIAGNOSIS — J438 Other emphysema: Secondary | ICD-10-CM

## 2013-10-23 DIAGNOSIS — Z23 Encounter for immunization: Secondary | ICD-10-CM

## 2013-10-23 MED ORDER — TIOTROPIUM BROMIDE-OLODATEROL 2.5-2.5 MCG/ACT IN AERS: 2.0000 | INHALATION_SPRAY | Freq: Every day | RESPIRATORY_TRACT | Status: DC

## 2013-10-23 NOTE — Patient Instructions (Addendum)
Pulm rehab will be ordered Flu vaccine given Trial Stiolto two puff daily in place of dulera/ spiriva We will work on your portable oxygen concentrator Return 3 months

## 2013-10-23 NOTE — Progress Notes (Signed)
   Subjective:    Patient ID: Gilbert Jefferson, male    DOB: 1950/11/27, 63 y.o.   MRN: [id number]  HPI  63 y.o. Quit smoking 12/2011  male seen for initial pulmonary consult 01/04/12 for COPD GOLD IV criteria 09/2012   10/23/2013 Chief Complaint  Patient presents with  . 4 month follow up    Increased SOB in the mornings after getting out of bed x 1 month.  This improves after taking Spiriva.  Dullness in chest x 2 wks in the mornings.  Occas nonprod cough.  Notes more dyspnea when first out of bed.  Occ dry cough.  No real wheeze.  No chest pain, just tightness.  No real edema in feet.  Off all tobacco products.   Review of Systems  Ros neg except as above    Objective:   Physical Exam  Filed Vitals:   10/23/13 1036  BP: 132/76  Pulse: 73  Temp: 98.1 F (36.7 C)  TempSrc: Oral  Height: 5\' 8"  (1.727 m)  Weight: 184 lb 3.2 oz (83.553 kg)  SpO2: 97%    Gen: Pleasant, well-nourished, in no distress,  normal affect  ENT: No lesions,  mouth clear,  oropharynx clear, no postnasal drip  Neck: No JVD, no TMG, no carotid bruits  Lungs: No use of accessory muscles, no dullness to percussion, distant BS  Cardiovascular: RRR, heart sounds normal, no murmur or gallops, no peripheral edema  Abdomen: soft and NT, no HSM,  BS normal  Musculoskeletal: No deformities, no cyanosis or clubbing  Neuro: alert, non focal  Skin: Warm, no lesions or rashes  No results found.     Assessment & Plan:   COPD (chronic obstructive pulmonary disease) Gold D COPD gold stage D. Stable at this time Plan Pulm rehab will be ordered Flu vaccine given Trial Stiolto two puff daily in place of dulera/ spiriva We will work on your portable oxygen concentrator Return 3 months     Updated Medication List Outpatient Encounter Prescriptions as of 10/23/2013  Medication Sig  . albuterol (PROAIR HFA) 108 (90 BASE) MCG/ACT inhaler Inhale 1-2 puffs into the lungs every 6 (six) hours as needed for  shortness of breath.  Marland Kitchen buPROPion (WELLBUTRIN XL) 150 MG 24 hr tablet Take 150 mg by mouth daily as needed.  . [DISCONTINUED] buPROPion (WELLBUTRIN XL) 150 MG 24 hr tablet Take 1 tablet (150 mg total) by mouth daily.  . [DISCONTINUED] tiotropium (SPIRIVA HANDIHALER) 18 MCG inhalation capsule Place 1 capsule (18 mcg total) into inhaler and inhale daily.  . Tiotropium Bromide-Olodaterol (STIOLTO RESPIMAT) 2.5-2.5 MCG/ACT AERS Inhale 2 puffs into the lungs daily.  . Tiotropium Bromide-Olodaterol (STIOLTO RESPIMAT) 2.5-2.5 MCG/ACT AERS Inhale 2 puffs into the lungs daily.  . [DISCONTINUED] mometasone-formoterol (DULERA) 200-5 MCG/ACT AERO Inhale 2 puffs into the lungs 2 (two) times daily.  . [DISCONTINUED] naproxen sodium (ANAPROX) 220 MG tablet Take 220 mg by mouth 2 (two) times daily with a meal.  . [DISCONTINUED] nicotine polacrilex (NICORETTE) 2 MG lozenge Place 2 mg inside cheek as needed.

## 2013-10-23 NOTE — Assessment & Plan Note (Signed)
COPD gold stage D. Stable at this time Plan Pulm rehab will be ordered Flu vaccine given Trial Stiolto two puff daily in place of dulera/ spiriva We will work on your portable oxygen concentrator Return 3 months

## 2013-11-13 ENCOUNTER — Telehealth (HOSPITAL_COMMUNITY): Payer: Self-pay

## 2013-11-13 NOTE — Telephone Encounter (Signed)
I have called and left a message with Gilbert Jefferson to inquire about participation in Pulmonary Rehab. Will send letter in mail and follow up.

## 2013-11-16 ENCOUNTER — Telehealth (HOSPITAL_COMMUNITY): Payer: Self-pay

## 2013-11-16 NOTE — Telephone Encounter (Signed)
Called patient regarding entrance to Pulmonary Rehab.  Patient states that they are interested in attending the program.  [name] is going to verify insurance coverage and follow up.    

## 2013-11-16 NOTE — Telephone Encounter (Signed)
Patient called insurance company to verify coverage for Pulmonary Rehab.  Patient states that his insurance would cover 80% of the program and he would be responsible for the other 20%.  Patient states that at this time he could not afford that.  Patient was offered our maintenance program which is a monthly cost of $100.  Patient states that he could also not afford this.  Patient was encouraged to contact us in the future if his financial situation changes.

## 2013-11-16 NOTE — Telephone Encounter (Signed)
I have called and left a message with Martie to inquire about participation in Pulmonary Rehab. Will send letter in mail and follow up.

## 2013-12-14 ENCOUNTER — Telehealth: Payer: Self-pay | Admitting: Critical Care Medicine

## 2013-12-14 NOTE — Telephone Encounter (Signed)
Gilbert Jefferson returned call. Gilbert Jefferson requested any OV notes that specifically indicate the pt will need disability d/t function. OV note from 04/10/2013 has been faxed to [phone] with claim number: [id number]. Vicki aware. Nothing further needed.

## 2013-12-14 NOTE — Telephone Encounter (Signed)
lmtcb for [employer]

## 2013-12-14 NOTE — Telephone Encounter (Signed)
[name] returned call & can be reached at [phone] x [phone].  Gilbert Jefferson

## 2013-12-21 ENCOUNTER — Encounter: Payer: Self-pay | Admitting: Critical Care Medicine

## 2013-12-21 ENCOUNTER — Ambulatory Visit (INDEPENDENT_AMBULATORY_CARE_PROVIDER_SITE_OTHER): Payer: BC Managed Care – PPO | Admitting: Critical Care Medicine

## 2013-12-21 VITALS — BP 134/72 | HR 68 | Ht 68.0 in | Wt 192.2 lb

## 2013-12-21 DIAGNOSIS — J432 Centrilobular emphysema: Secondary | ICD-10-CM

## 2013-12-21 DIAGNOSIS — Z23 Encounter for immunization: Secondary | ICD-10-CM

## 2013-12-21 MED ORDER — CITALOPRAM HYDROBROMIDE 10 MG PO TABS: 10.0000 mg | ORAL_TABLET | Freq: Every day | ORAL | Status: DC

## 2013-12-21 MED ORDER — TIOTROPIUM BROMIDE-OLODATEROL 2.5-2.5 MCG/ACT IN AERS: 2.0000 | INHALATION_SPRAY | Freq: Every day | RESPIRATORY_TRACT | Status: DC

## 2013-12-21 NOTE — Progress Notes (Signed)
   Subjective:    Patient ID: Gilbert Jefferson, male    DOB: 1950/06/04, 63 y.o.   MRN: [id number]  HPI 63 y.o. Quit smoking 12/2011  male seen for initial pulmonary consult 01/04/12 for COPD GOLD IV criteria 09/2012   12/21/2013 Chief Complaint  Patient presents with  . 3 month follow up    DOE is unchanged.  No cough, wheezing, chest tightness/pain.    Pt declined lung transplant.  LT Disability approved.  Now no real cough.  Unchanged with dyspnea.  No chest pain. No edema in feet. Notes some numbness in R leg with lumbar disease. C/o anxiety, wellbutrin used for smoking cessation Pt denies any significant sore throat, nasal congestion or excess secretions, fever, chills, sweats, unintended weight loss, pleurtic or exertional chest pain, orthopnea PND, or leg swelling Pt denies any increase in rescue therapy over baseline, denies waking up needing it or having any early am or nocturnal exacerbations of coughing/wheezing/or dyspnea. Pt also denies any obvious fluctuation in symptoms with  weather or environmental change or other alleviating or aggravating factors    Review of Systems Ros neg except as above    Objective:   Physical Exam Filed Vitals:   12/21/13 1003  BP: 134/72  Pulse: 68  Height: 5\' 8"  (1.727 m)  Weight: 192 lb 3.2 oz (87.181 kg)  SpO2: 93%    Gen: Pleasant, well-nourished, in no distress,  normal affect  ENT: No lesions,  mouth clear,  oropharynx clear, no postnasal drip  Neck: No JVD, no TMG, no carotid bruits  Lungs: No use of accessory muscles, no dullness to percussion, distant BS  Cardiovascular: RRR, heart sounds normal, no murmur or gallops, no peripheral edema  Abdomen: soft and NT, no HSM,  BS normal  Musculoskeletal: No deformities, no cyanosis or clubbing  Neuro: alert, non focal  Skin: Warm, no lesions or rashes  No results found.     Assessment & Plan:   COPD (chronic obstructive pulmonary disease) Gold D Gold D Copd. Pt declined  lung transplant evaluation Hx of anxiety/depression  Plan Prevnar 66 was given Stay on 3Liters at altitude in plane and in [place] Samples of stiolto were given Stop Wellbutrin>>pt has not been using anyway  Try citalopram 10 mg once daily No change in medications Return 4 months     Updated Medication List Outpatient Encounter Prescriptions as of 12/21/2013  Medication Sig  . albuterol (PROAIR HFA) 108 (90 BASE) MCG/ACT inhaler Inhale 1-2 puffs into the lungs every 6 (six) hours as needed for shortness of breath.  . Tiotropium Bromide-Olodaterol (STIOLTO RESPIMAT) 2.5-2.5 MCG/ACT AERS Inhale 2 puffs into the lungs daily.  . [DISCONTINUED] Tiotropium Bromide-Olodaterol (STIOLTO RESPIMAT) 2.5-2.5 MCG/ACT AERS Inhale 2 puffs into the lungs daily.  . [DISCONTINUED] Tiotropium Bromide-Olodaterol (STIOLTO RESPIMAT) 2.5-2.5 MCG/ACT AERS Inhale 2 puffs into the lungs daily.  . citalopram (CELEXA) 10 MG tablet Take 1 tablet (10 mg total) by mouth daily.  . citalopram (CELEXA) 10 MG tablet Take 1 tablet (10 mg total) by mouth daily.  . [DISCONTINUED] buPROPion (WELLBUTRIN XL) 150 MG 24 hr tablet Take 150 mg by mouth daily as needed.

## 2013-12-21 NOTE — Patient Instructions (Addendum)
Prevnar 13 was given Stay on 3Liters at altitude in plane and in [place] Samples of stiolto were given Stop Wellbutrin  Try citalopram 10 mg once daily No change in medications Return 4 months

## 2013-12-21 NOTE — Assessment & Plan Note (Signed)
Gold D Copd. Pt declined lung transplant evaluation Hx of anxiety/depression  Plan Prevnar 13 was given Stay on 3Liters at altitude in plane and in [place] Samples of stiolto were given Stop Wellbutrin>>pt has not been using anyway  Try citalopram 10 mg once daily No change in medications Return 4 months

## 2014-02-05 ENCOUNTER — Telehealth: Payer: Self-pay | Admitting: Critical Care Medicine

## 2014-02-05 NOTE — Telephone Encounter (Signed)
Received a [employer] Restrictions and Limitations form from [employer].  Form completed and signed by Dr. Joya Gaskins.  It has been sent back to [facility].

## 2014-04-08 ENCOUNTER — Telehealth: Payer: Self-pay | Admitting: Critical Care Medicine

## 2014-04-08 MED ORDER — AZITHROMYCIN 250 MG PO TABS: ORAL_TABLET | ORAL | Status: DC

## 2014-04-08 NOTE — Telephone Encounter (Signed)
Spoke with pt and advised that rx for Zpak was sent to pharmacy.

## 2014-04-08 NOTE — Telephone Encounter (Signed)
Ok for z pak.

## 2014-04-08 NOTE — Telephone Encounter (Signed)
Spoke with pt. States that he needs a Zpack sent in. Reports dry cough, SOB, runny nose x2 weeks. Has tried Claritin D and Mucinex with no relief.  PW - please advise on refill. Thanks.

## 2014-04-15 ENCOUNTER — Telehealth: Payer: Self-pay | Admitting: Critical Care Medicine

## 2014-04-15 MED ORDER — PREDNISONE 10 MG PO TABS: ORAL_TABLET | ORAL | Status: DC

## 2014-04-15 NOTE — Telephone Encounter (Signed)
Pt finished zpak 3 days ago.  Still c/o hoarseness, chest tightness, sob on exertion, occas non prod cough in am.  Please advise.  No Known Allergies  Current Outpatient Prescriptions on File Prior to Visit  Medication Sig Dispense Refill  . albuterol (PROAIR HFA) 108 (90 BASE) MCG/ACT inhaler Inhale 1-2 puffs into the lungs every 6 (six) hours as needed for shortness of breath. 3 Inhaler 3  . azithromycin (ZITHROMAX) 250 MG tablet Take 2 tablets the first day then 1 tablet daily until gone. 6 tablet 0  . citalopram (CELEXA) 10 MG tablet Take 1 tablet (10 mg total) by mouth daily. 90 tablet 1  . citalopram (CELEXA) 10 MG tablet Take 1 tablet (10 mg total) by mouth daily. 30 tablet 0  . Tiotropium Bromide-Olodaterol (STIOLTO RESPIMAT) 2.5-2.5 MCG/ACT AERS Inhale 2 puffs into the lungs daily. 2 Inhaler 0   No current facility-administered medications on file prior to visit.

## 2014-04-15 NOTE — Telephone Encounter (Signed)
Sounds as if bacterial infection is gone, but residual bronchitis Offer prednisone taper 10 mg, # 20, 4 X 2 DAYS, 3 X 2 DAYS, 2 X 2 DAYS, 1 X 2 DAYS

## 2014-04-15 NOTE — Telephone Encounter (Signed)
Rx sent to [facility] with the pt and notified of recs per CDY  Nothing further needed

## 2014-04-15 NOTE — Telephone Encounter (Signed)
LMTCB x 1 

## 2014-04-21 ENCOUNTER — Encounter: Payer: Self-pay | Admitting: Critical Care Medicine

## 2014-04-21 ENCOUNTER — Ambulatory Visit (INDEPENDENT_AMBULATORY_CARE_PROVIDER_SITE_OTHER): Payer: BLUE CROSS/BLUE SHIELD | Admitting: Critical Care Medicine

## 2014-04-21 VITALS — BP 146/84 | HR 51 | Temp 97.7°F | Ht 68.0 in | Wt 186.0 lb

## 2014-04-21 DIAGNOSIS — J432 Centrilobular emphysema: Secondary | ICD-10-CM

## 2014-04-21 MED ORDER — TIOTROPIUM BROMIDE-OLODATEROL 2.5-2.5 MCG/ACT IN AERS: 2.0000 | INHALATION_SPRAY | Freq: Every day | RESPIRATORY_TRACT | Status: DC

## 2014-04-21 NOTE — Patient Instructions (Signed)
We will try to refer you to pulm rehab at a reduced rate Refills on stiolto sent No other changes Watch carb intake and eat oatmeal with breakfast every day Return 6 months

## 2014-04-21 NOTE — Progress Notes (Signed)
Subjective:    Patient ID: Gilbert Jefferson, male    DOB: [birth year], 64 y.o.   MRN: [id number]  HPI  64 y.o. m  Quit smoking 12/2011  male seen for initial pulmonary consult 01/04/12 for COPD GOLD IV criteria 09/2012   04/21/2014 Chief Complaint  Patient presents with  . 4 month follow up    increased SOB, dizziness in mornings, sinus pressure x 1 month.  Was given zpak. Symptoms returned after finishing zpak. Was then given pred taper - has 2 days left.  Symptoms now back to baseline.    Pt began illness 1 month ago: sinus pressure behind eyes, more cough, dry cough, noted some pndrip, no sore throat.  Rx Zpak, helped but worse off zpak, then Rx pred and this has helped.  No f/c/s. Noted some chest tightness.  Pt denies any significant sore throat, nasal congestion or excess secretions, fever, chills, sweats, unintended weight loss, pleurtic or exertional chest pain, orthopnea PND, or leg swelling Pt denies any increase in rescue therapy over baseline, denies waking up needing it or having any early am or nocturnal exacerbations of coughing/wheezing/or dyspnea.. Pt also denies any obvious fluctuation in symptoms with  weather or environmental change or other alleviating or aggravating factors No GERD.    CAT Score 04/21/2014 10/23/2013 10/08/2012 06/25/2012  Total CAT Score 16 17 7 13      Review of Systems Ros neg except as above    Objective:   Physical Exam Filed Vitals:   04/21/14 1033  BP: 146/84  Pulse: 51  Temp: 97.7 F (36.5 C)  TempSrc: Oral  Height: 5\' 8"  (1.727 m)  Weight: 186 lb (84.369 kg)  SpO2: 100%    Gen: Pleasant, well-nourished, in no distress,  normal affect  ENT: No lesions,  mouth clear,  oropharynx clear, no postnasal drip  Neck: No JVD, no TMG, no carotid bruits  Lungs: No use of accessory muscles, no dullness to percussion, distant BS  Cardiovascular: RRR, heart sounds normal, no murmur or gallops, no peripheral edema  Abdomen: soft and NT, no HSM,   BS normal  Musculoskeletal: No deformities, no cyanosis or clubbing  Neuro: alert, non focal  Skin: Warm, no lesions or rashes  No results found.     Assessment & Plan:   COPD (chronic obstructive pulmonary disease) Gold D Gold D copd on Stiolto stable Chronic resp failure hypoxic Pt declined lung transplant evaluation Plan Weight loss Try to get pt into pulm rehab Cont stiolto monotherapy and prn SABA Cont oxygen therapy: This patient continues to use and benefit from her oxygen therapy and has re qualified at this visit for continued oxygen therapy      Updated Medication List Outpatient Encounter Prescriptions as of 04/21/2014  Medication Sig  . albuterol (PROAIR HFA) 108 (90 BASE) MCG/ACT inhaler Inhale 1-2 puffs into the lungs every 6 (six) hours as needed for shortness of breath.  . citalopram (CELEXA) 10 MG tablet Take 1 tablet (10 mg total) by mouth daily.  . predniSONE (DELTASONE) 10 MG tablet 4 x 2 days, 3 x 2 days, 2 x 2 days, 1 x 2 days, then stop  . Tiotropium Bromide-Olodaterol (STIOLTO RESPIMAT) 2.5-2.5 MCG/ACT AERS Inhale 2 puffs into the lungs daily.  . [DISCONTINUED] citalopram (CELEXA) 10 MG tablet Take 1 tablet (10 mg total) by mouth daily.  . [DISCONTINUED] Tiotropium Bromide-Olodaterol (STIOLTO RESPIMAT) 2.5-2.5 MCG/ACT AERS Inhale 2 puffs into the lungs daily.  . [DISCONTINUED] azithromycin (ZITHROMAX) 250 MG tablet  Take 2 tablets the first day then 1 tablet daily until gone. (Patient not taking: Reported on 04/21/2014)

## 2014-04-22 ENCOUNTER — Telehealth: Payer: Self-pay | Admitting: Critical Care Medicine

## 2014-04-22 NOTE — Telephone Encounter (Signed)
Please advise PCC;s thanks 

## 2014-04-22 NOTE — Telephone Encounter (Signed)
Called & spoke with [facility] @Pulm  Rehab.  I advised that the diagnosis boxes on the left side are almost cut off of the form that Dr. Joya Gaskins signed..  She states that is the part she was missing.  Cloyde Reams will fill out one of their forms and fax it to our office for Dr. Joya Gaskins to sign.  They need to see all of the diagnosis boxes  Daw[place] Law

## 2014-04-22 NOTE — Assessment & Plan Note (Signed)
Gold D copd on Stiolto stable Chronic resp failure hypoxic Pt declined lung transplant evaluation Plan Weight loss Try to get pt into pulm rehab Cont stiolto monotherapy and prn SABA Cont oxygen therapy: This patient continues to use and benefit from her oxygen therapy and has re qualified at this visit for continued oxygen therapy

## 2014-04-30 ENCOUNTER — Telehealth (HOSPITAL_COMMUNITY): Payer: Self-pay

## 2014-04-30 NOTE — Telephone Encounter (Signed)
Called patient regarding entrance to Pulmonary Rehab.  Patient states that they are interested in attending the program.  Karel is going to verify insurance coverage and follow up.

## 2014-05-13 ENCOUNTER — Telehealth (HOSPITAL_COMMUNITY): Payer: Self-pay

## 2014-05-13 NOTE — Telephone Encounter (Signed)
I have called and left a message with [facility] to inquire about participation in Pulmonary Rehab per Dr. Joya Gaskins referral. Will send letter in mail and follow up.

## 2014-05-13 NOTE — Telephone Encounter (Signed)
Patient has declined Pulmonary Rehab due to lack of insurance coverage and a $55 copay each session.  Patient was offered our maintenance program that is $68 a month.  Patient was encouraged to contact us in the future if he is interested in that program.

## 2014-05-27 ENCOUNTER — Other Ambulatory Visit: Payer: Self-pay | Admitting: Critical Care Medicine

## 2014-06-30 ENCOUNTER — Telehealth: Payer: Self-pay | Admitting: Critical Care Medicine

## 2014-06-30 MED ORDER — AZITHROMYCIN 250 MG PO TABS: ORAL_TABLET | ORAL | Status: AC

## 2014-06-30 NOTE — Telephone Encounter (Signed)
Ok for Zpak x 1

## 2014-06-30 NOTE — Telephone Encounter (Signed)
Spoke with pt. States he would like a Zpack sent in. Reports coughing up yellow mucus x3 days. Denies SOB, chest tightness or wheezing. Has tried several OTC medications without relief.  PW - please advise. Thanks.

## 2014-06-30 NOTE — Telephone Encounter (Signed)
Rx has been sent in. Pt is aware. Nothing further was needed. 

## 2014-07-09 ENCOUNTER — Telehealth: Payer: Self-pay | Admitting: Critical Care Medicine

## 2014-07-09 MED ORDER — MOXIFLOXACIN HCL 400 MG PO TABS: 400.0000 mg | ORAL_TABLET | Freq: Every day | ORAL | Status: DC

## 2014-07-09 NOTE — Telephone Encounter (Signed)
Patient says she is feeling better, but still coughing up green mucus.  Patient finished Zpak.  [facility]  No Known Allergies  PW - please advise.

## 2014-07-09 NOTE — Telephone Encounter (Signed)
Spoke with pt. Informed pt PW was sending new rx for Avelox to pharmacy. Nothing further needed at this time.

## 2014-07-09 NOTE — Telephone Encounter (Signed)
Call generic avelox 400mg  daily x 5 days

## 2014-08-20 ENCOUNTER — Telehealth: Payer: Self-pay | Admitting: Critical Care Medicine

## 2014-08-20 DIAGNOSIS — J449 Chronic obstructive pulmonary disease, unspecified: Secondary | ICD-10-CM

## 2014-08-20 NOTE — Telephone Encounter (Signed)
Spoke with Maudie Mercury from [facility]. She reports pt placed himself on 3 l/m. Our order is for 2 l/m. Pt is requesting a POC that goes higher than 3 liters. Please advise Dr. Joya Gaskins thanks

## 2014-08-22 NOTE — Telephone Encounter (Signed)
Yes provide poc for 4L pulse

## 2014-08-23 NOTE — Telephone Encounter (Signed)
lmtcb x1 for Gilbert Jefferson. 

## 2014-08-23 NOTE — Telephone Encounter (Signed)
[phone], [id number] cb

## 2014-08-23 NOTE — Telephone Encounter (Signed)
Called and spoke with Maudie Mercury at [facility]. Discussed order with Maudie Mercury and ordered POC for patient with POC to accommodate 5L.  Called and spoke with patient advised him of Dr. Bettina Gavia recommendations to stay on 4L pulse. Notified patient that POC ordered with accommodate up to 5L.  He was happy about that because he says some times when he is walking the dunes at [employer] he needs more oxygen to get up the dunes.   Nothing further needed.

## 2014-08-25 ENCOUNTER — Other Ambulatory Visit: Payer: Self-pay | Admitting: Critical Care Medicine

## 2014-08-30 ENCOUNTER — Telehealth: Payer: Self-pay | Admitting: Critical Care Medicine

## 2014-08-30 NOTE — Telephone Encounter (Signed)
Called and spoke to pt. Pt requesting [employer] form for POC. Letter printed on letterhead and signed by Dr. Joya Gaskins. Called and spoke to pt and informed him the letter is ready to be picked up. Pt verbalized understanding and denied any further questions or concerns at this time.

## 2014-10-19 ENCOUNTER — Encounter: Payer: Self-pay | Admitting: Pulmonary Disease

## 2014-10-19 ENCOUNTER — Ambulatory Visit (INDEPENDENT_AMBULATORY_CARE_PROVIDER_SITE_OTHER): Payer: BLUE CROSS/BLUE SHIELD | Admitting: Pulmonary Disease

## 2014-10-19 VITALS — BP 134/68 | HR 62 | Ht 68.0 in | Wt 192.0 lb

## 2014-10-19 DIAGNOSIS — Z23 Encounter for immunization: Secondary | ICD-10-CM | POA: Diagnosis not present

## 2014-10-19 DIAGNOSIS — J411 Mucopurulent chronic bronchitis: Secondary | ICD-10-CM | POA: Diagnosis not present

## 2014-10-19 NOTE — Patient Instructions (Signed)
Keep taking your medications as you're doing Stay active We will see you back in 6 months or sooner if needed

## 2014-10-19 NOTE — Progress Notes (Signed)
   Subjective:    Patient ID: Gilbert Jefferson, male    DOB: [birth year], 64 y.o.   MRN: [id number]  Synopsis: Former patient of Dr. Joya Gaskins with COPD Copd gold D  01/18/2012 ONO RA normal Arlyce Harman 10/08/12: FeV1 29% FeV1/FVC 41% Alpha one antitrypsin 04/10/2013>>normal 145 Spiro 04/10/2013>> FeV1 26% FVC 56% FeV1/FVC 53% patient COPD   HPI Chief Complaint  Patient presents with  . Follow-up    former PW pt being treated for emphysema.  pt notes worsening SOB in afternoons, increased fatigue.     He has known that he had COPD since 12/2011 when he first saw Dr. Joya Gaskins.  He was diagnosed with COPD then.  He had been experieicnign more dyspnea.  No hospital admissions, but he ends up getting prednisone 2 times per year for COPD. He has been doing fine lately but he notes that in the afternoons he is tired.  He thinks that the summer heat lead to this. He is not sleepy, not tired.  No blood work recently.   He has been using his oxygen regularly. He does exercise reguarly, he has been going to the [facility].  He thinks that he is not getting enough oxygen when on the treadmill> beter now when using 5 Lpm.     Past Medical History  Diagnosis Date  . Hypertension   . COPD (chronic obstructive pulmonary disease)   . Osteoarthrosis   . Varicose veins       Review of Systems     Objective:   Physical Exam Filed Vitals:   10/19/14 1505  BP: 134/68  Pulse: 62  Height: 5\' 8"  (1.727 m)  Weight: 192 lb (87.091 kg)  SpO2: 99%   RA  Gen: well appearing HENT: OP clear, TM's clear, neck supple PULM: CTA B, normal percussion CV: RRR, no mgr, trace edema GI: BS+, soft, nontender Derm: no cyanosis or rash Psyche: normal mood and affect        Assessment & Plan:  COPD (chronic obstructive pulmonary disease) Gold D He has severe COPD but this has been a stable interval for him. He had an exacerbation earlier in the year which was treated with prednisone. He says this usually does the  trick.  He continues to use and benefit from his oxygen.  Plan: Continue Stiolto Flu shot today Continue 3 L of oxygen continuously, 5 L with exertion Follow-up 6 months or sooner if needed    Current outpatient prescriptions:  .  albuterol (PROAIR HFA) 108 (90 BASE) MCG/ACT inhaler, Inhale 1-2 puffs into the lungs every 6 (six) hours as needed for shortness of breath., Disp: 3 Inhaler, Rfl: 3 .  citalopram (CELEXA) 10 MG tablet, TAKE 1 TABLET DAILY, Disp: 90 tablet, Rfl: 1 .  Tiotropium Bromide-Olodaterol (STIOLTO RESPIMAT) 2.5-2.5 MCG/ACT AERS, Inhale 2 puffs into the lungs daily., Disp: 3 Inhaler, Rfl: 4

## 2014-10-19 NOTE — Assessment & Plan Note (Signed)
He has severe COPD but this has been a stable interval for him. He had an exacerbation earlier in the year which was treated with prednisone. He says this usually does the trick.  He continues to use and benefit from his oxygen.  Plan: Continue Stiolto Flu shot today Continue 3 L of oxygen continuously, 5 L with exertion Follow-up 6 months or sooner if needed

## 2014-12-03 ENCOUNTER — Telehealth: Payer: Self-pay | Admitting: Pulmonary Disease

## 2014-12-03 MED ORDER — AZITHROMYCIN 250 MG PO TABS
ORAL_TABLET | ORAL | Status: DC
Start: 2014-12-03 — End: 2015-04-07

## 2014-12-03 MED ORDER — PREDNISONE 10 MG PO TABS: ORAL_TABLET | ORAL | Status: DC

## 2014-12-03 NOTE — Telephone Encounter (Signed)
Patient notified of Dr. Anastasia Pall recommendations. Rx sent to [facility] per patient's request. Nothing further needed. Closing encounter

## 2014-12-03 NOTE — Telephone Encounter (Signed)
Called and spoke with pt Pt c/o productive cough with clear phlegm, chest congestion, chest tightness, wheezing, and runny nose since 12/02/14 Pt states no fever, PND, sore throat, or hemoptysis  Pt states worsening symptoms since this morning Pt would like something called into [facility]  Dr Lake Bells, please advise. Thank you

## 2014-12-03 NOTE — Telephone Encounter (Signed)
Prednisone: Take 40mg  po daily for 3 days, then take 30mg  po daily for 3 days, then take 20mg  po daily for two days, then take 10mg  po daily for 2 days  Zpack

## 2015-02-20 ENCOUNTER — Other Ambulatory Visit: Payer: Self-pay | Admitting: Critical Care Medicine

## 2015-02-24 NOTE — Telephone Encounter (Signed)
Received refill request on citalopram. Last refilled by Dr. Joya Gaskins. Please advise Dr. Lake Bells if okay to refill? thanks

## 2015-03-08 ENCOUNTER — Telehealth: Payer: Self-pay | Admitting: Pulmonary Disease

## 2015-03-08 MED ORDER — DOXYCYCLINE HYCLATE 100 MG PO TABS: 100.0000 mg | ORAL_TABLET | Freq: Two times a day (BID) | ORAL | Status: DC

## 2015-03-08 MED ORDER — PREDNISONE 10 MG PO TABS: ORAL_TABLET | ORAL | Status: DC

## 2015-03-08 NOTE — Telephone Encounter (Signed)
Called spoke with pt and is aware of recs below. Rx's sent to the pharmacy. Nothing further needed

## 2015-03-08 NOTE — Telephone Encounter (Signed)
Doxycycline 100mg  po bid daily x5 days Prednisone: Take 40mg  po daily for 3 days, then take 30mg  po daily for 3 days, then take 20mg  po daily for two days, then take 10mg  po daily for 2 days

## 2015-03-08 NOTE — Telephone Encounter (Signed)
Pt c/o increased shortness of breath, nonprod cough, chest tightness Xfew days.  Denies fever, sinus congestion, PND. Pt has not taken anything to help with s/s. Pt uses [facility]. Pt requesting further recs- states he feels like "he has a lung infection".  BQ please advise.  Thanks!

## 2015-03-24 ENCOUNTER — Ambulatory Visit (INDEPENDENT_AMBULATORY_CARE_PROVIDER_SITE_OTHER): Payer: BLUE CROSS/BLUE SHIELD | Admitting: Pulmonary Disease

## 2015-03-24 ENCOUNTER — Ambulatory Visit (INDEPENDENT_AMBULATORY_CARE_PROVIDER_SITE_OTHER)
Admission: RE | Admit: 2015-03-24 | Discharge: 2015-03-24 | Disposition: A | Discharge status: Home / Self Care | Payer: BLUE CROSS/BLUE SHIELD | Source: Ambulatory Visit | Attending: Pulmonary Disease | Admitting: Pulmonary Disease

## 2015-03-24 ENCOUNTER — Encounter: Payer: Self-pay | Admitting: Pulmonary Disease

## 2015-03-24 ENCOUNTER — Other Ambulatory Visit (INDEPENDENT_AMBULATORY_CARE_PROVIDER_SITE_OTHER): Payer: BLUE CROSS/BLUE SHIELD

## 2015-03-24 VITALS — BP 132/80 | HR 96 | Ht 68.0 in | Wt 191.0 lb

## 2015-03-24 DIAGNOSIS — R0602 Shortness of breath: Secondary | ICD-10-CM

## 2015-03-24 DIAGNOSIS — J9611 Chronic respiratory failure with hypoxia: Secondary | ICD-10-CM

## 2015-03-24 DIAGNOSIS — R109 Unspecified abdominal pain: Secondary | ICD-10-CM

## 2015-03-24 DIAGNOSIS — J411 Mucopurulent chronic bronchitis: Secondary | ICD-10-CM | POA: Diagnosis not present

## 2015-03-24 DIAGNOSIS — R5383 Other fatigue: Secondary | ICD-10-CM | POA: Diagnosis not present

## 2015-03-24 HISTORY — DX: Unspecified abdominal pain: R10.9

## 2015-03-24 LAB — URINALYSIS
BILIRUBIN URINE: NEGATIVE
HGB URINE DIPSTICK: NEGATIVE
Ketones, ur: NEGATIVE
Leukocytes, UA: NEGATIVE
Nitrite: NEGATIVE
PH: 6.5 (ref 5.0–8.0)
Specific Gravity, Urine: <=1.005 — AB (ref 1.000–1.030)
TOTAL PROTEIN, URINE-UPE24: NEGATIVE
Urine Glucose: NEGATIVE
Urobilinogen, UA: 0.2 (ref 0.0–1.0)

## 2015-03-24 LAB — CBC WITH DIFFERENTIAL/PLATELET
BASOS ABS: 0 10*3/uL (ref 0.0–0.1)
Basophils Relative: 0.1 % (ref 0.0–3.0)
EOS ABS: 0.1 10*3/uL (ref 0.0–0.7)
Eosinophils Relative: 2 % (ref 0.0–5.0)
HCT: 40.9 % (ref 39.0–52.0)
Hemoglobin: 13.8 g/dL (ref 13.0–17.0)
LYMPHS ABS: 1.1 10*3/uL (ref 0.7–4.0)
LYMPHS PCT: 15 % (ref 12.0–46.0)
MCHC: 33.8 g/dL (ref 30.0–36.0)
MCV: 88.1 fl (ref 78.0–100.0)
MONOS PCT: 16.5 % — AB (ref 3.0–12.0)
Monocytes Absolute: 1.2 10*3/uL — ABNORMAL HIGH (ref 0.1–1.0)
NEUTROS PCT: 66.4 % (ref 43.0–77.0)
Neutro Abs: 4.7 10*3/uL (ref 1.4–7.7)
PLATELETS: 254 10*3/uL (ref 150.0–400.0)
RBC: 4.64 Mil/uL (ref 4.22–5.81)
RDW: 12.7 % (ref 11.5–15.5)
WBC: 7 10*3/uL (ref 4.0–10.5)

## 2015-03-24 MED ORDER — PREDNISONE 10 MG PO TABS: ORAL_TABLET | ORAL | Status: DC

## 2015-03-24 MED ORDER — ALBUTEROL SULFATE (2.5 MG/3ML) 0.083% IN NEBU: 2.5000 mg | INHALATION_SOLUTION | Freq: Four times a day (QID) | RESPIRATORY_TRACT | Status: DC | PRN

## 2015-03-24 MED ORDER — LEVOFLOXACIN 500 MG PO TABS: 500.0000 mg | ORAL_TABLET | Freq: Every day | ORAL | Status: DC

## 2015-03-24 NOTE — Patient Instructions (Signed)
Take the prednisone taper as prescribed Take the Levaquin for 5 days, when you were taking this do not when she to take the azithromycin with it, you can resume azithromycin after you complete the Levaquin We will call you with the results of the chest x-ray, blood work, and urinalysis We will see you back in 6 weeks either with me or my nurse practitioner at that point we may consider pulmonary rehabilitation

## 2015-03-24 NOTE — [facility] Notes (Signed)
Subjective:    Gilbert Jefferson ID: Gilbert Jefferson, male    DOB: 10-19-1950, 65 y.o.   MRN: [id number]  Synopsis: Former Gilbert Jefferson of Dr. Joya Gaskins with COPD Copd gold D  01/18/2012 ONO RA normal Gilbert Jefferson 10/08/12: FeV1 29% FeV1/FVC 41% Alpha one antitrypsin 04/10/2013>>normal 145 Spiro 04/10/2013>> FeV1 26% FVC 56% FeV1/FVC 53% Gilbert Jefferson COPD   HPI Chief Complaint  Gilbert Jefferson presents with  . Follow-up    Gilbert Jefferson c/o sob with any exertion, chest pain and tightness with deep breaths, nonprod cough.     Gilbert Jefferson has been struggling for the last month.  Gilbert Jefferson called [place] on 2/7 after two weeks of symptoms noting chest congestion, worsening cough, wheezing.  Gilbert Jefferson said that the felt better the first few days but Gilbert Jefferson has started to feel more short of breath lately.  Gilbert Jefferson can't get around as well.  Gilbert Jefferson has more soreness in his left upper abdomen.  No nausea or diarrhea.  His sleep habits have worsened some recently in that Gilbert Jefferson typically gets up at 5 AM but Gilbert Jefferson has been waking up at 3 AM. Gilbert Jefferson has not had fevers or chills.   Gilbert Jefferson has never had a sleep study.   Gilbert Jefferson is not bringing up anything with color in it. Gilbert Jefferson still takes stiolto and uses and benefits form his oxygen every day.   Past Medical History  Diagnosis Date  . Hypertension   . COPD (chronic obstructive pulmonary disease) ([place])   . Osteoarthrosis   . Varicose veins       Review of Systems  Constitutional: Positive for fatigue. Negative for fever and chills.  HENT: Negative for rhinorrhea and sinus pressure.   Respiratory: Positive for cough and shortness of breath. Negative for wheezing.   Cardiovascular: Negative for chest pain, palpitations and leg swelling.  Gastrointestinal: Positive for abdominal pain.       Objective:   Physical Exam Filed Vitals:   03/24/15 1548  BP: 132/80  Pulse: 96  Height: 5\' 8"  (1.727 m)  Weight: 191 lb (86.637 kg)  SpO2: 90%   RA  Gen: chronically ill appearing HENT: OP clear, TM's clear, neck supple PULM: poor air movement today,  no wheezing, normal percussion CV: RRR, no mgr, trace edema GI: BS+, soft, mild point tenderness just under costophrenic angle, mid axillary line, no masses palpable Derm: no cyanosis or rash Psyche: normal mood and affect  CBC    Component Value Date/Time   WBC 9.9 04/28/2010 1621   RBC 5.03 04/28/2010 1621   HGB 16.0 04/28/2010 1644   HCT 47.0 04/28/2010 1644   PLT 253 04/28/2010 1621   MCV 89.7 04/28/2010 1621   MCH 31.6 04/28/2010 1621   MCHC 35.3 04/28/2010 1621   RDW 12.7 04/28/2010 1621   LYMPHSABS 1.9 04/28/2010 1621   MONOABS 0.7 04/28/2010 1621   EOSABS 0.1 04/28/2010 1621   BASOSABS 0.0 04/28/2010 1621    BMET    Component Value Date/Time   NA 140 04/28/2010 1644   K 5.2* 04/28/2010 1644   CL 105 04/28/2010 1644   GLUCOSE 80 04/28/2010 1644   BUN 17 04/28/2010 1644   CREATININE 1.0 04/28/2010 1644          Assessment & Plan:  COPD (chronic obstructive pulmonary disease) Gold D Gilbert Jefferson has severe COPD and unfortunately Gilbert Jefferson has had a hard time shaking off this most recent exacerbation. Gilbert Jefferson is still symptomatic today. Presumably, this is likely due to a persistent exacerbation leading to severe symptoms. However, I  believe that Gilbert Jefferson's becoming deconditioned because of the duration of this. I think Gilbert Jefferson may benefit from pulmonary rehabilitation at some point.  Plan: Prednisone taper again Levaquin 5 days with a probiotic, Gilbert Jefferson was instructed to hold azithromycin when Gilbert Jefferson takes this, then resume azithromycin afterwards Continue Stiolto Prescribed albuterol nebulizer for him to use on an as-needed basis at home Follow-up 6 weeks with either me or our nurse practitioner At the next visit consider pulmonary rehabilitation referral  Chronic respiratory failure ([place]) Continue oxygen as prescribed  Flank pain Gilbert Jefferson is having flank pain with this symptom and Gilbert Jefferson has some mild tenderness on exam just underneath the left costophrenic border. My presumption is that this is due to  coughing leading to muscular inflammation. However, differential diagnosis would include nephrolithiasis, splenic enlargement, or more ominously some sort of renal problem.  Plan: Urinalysis Chest x-ray to ensure there's no evidence of pneumonia in that area CBC If no improvement then CT abdomen and pelvis     Current outpatient prescriptions:  .  albuterol (PROAIR HFA) 108 (90 BASE) MCG/ACT inhaler, Inhale 1-2 puffs into the lungs every 6 (six) hours as needed for shortness of breath., Disp: 3 Inhaler, Rfl: 3 .  azithromycin (ZITHROMAX) 250 MG tablet, Take as directed., Disp: 6 tablet, Rfl: 0 .  Tiotropium Bromide-Olodaterol (STIOLTO RESPIMAT) 2.5-2.5 MCG/ACT AERS, Inhale 2 puffs into the lungs daily., Disp: 3 Inhaler, Rfl: 4 .  albuterol (PROVENTIL) (2.5 MG/3ML) 0.083% nebulizer solution, Take 3 mLs (2.5 mg total) by nebulization every 6 (six) hours as needed for wheezing or shortness of breath., Disp: 75 mL, Rfl: 12 .  levofloxacin (LEVAQUIN) 500 MG tablet, Take 1 tablet (500 mg total) by mouth daily., Disp: 5 tablet, Rfl: 0 .  predniSONE (DELTASONE) 10 MG tablet, Take 40mg  po daily for 3 days, then take 30mg  po daily for 3 days, then take 20mg  po daily for two days, then take 10mg  po daily for 2 days, Disp: 27 tablet, Rfl: 0

## 2015-03-24 NOTE — Assessment & Plan Note (Signed)
Continue oxygen as prescribed 

## 2015-03-24 NOTE — Assessment & Plan Note (Signed)
He has severe COPD and unfortunately he has had a hard time shaking off this most recent exacerbation. He is still symptomatic today. Presumably, this is likely due to a persistent exacerbation leading to severe symptoms. However, I believe that he's becoming deconditioned because of the duration of this. I think he may benefit from pulmonary rehabilitation at some point.  Plan: Prednisone taper again Levaquin 5 days with a probiotic, he was instructed to hold azithromycin when he takes this, then resume azithromycin afterwards Continue Stiolto Prescribed albuterol nebulizer for him to use on an as-needed basis at home Follow-up 6 weeks with either me or our nurse practitioner At the next visit consider pulmonary rehabilitation referral

## 2015-03-24 NOTE — Assessment & Plan Note (Addendum)
He is having flank pain with this symptom and he has some mild tenderness on exam just underneath the left costophrenic border. My presumption is that this is due to coughing leading to muscular inflammation. However, differential diagnosis would include nephrolithiasis, splenic enlargement, or more ominously some sort of renal problem.  Plan: Urinalysis Chest x-ray to ensure there's no evidence of pneumonia in that area CBC If no improvement then CT abdomen and pelvis

## 2015-04-05 ENCOUNTER — Telehealth: Payer: Self-pay | Admitting: Pulmonary Disease

## 2015-04-05 MED ORDER — PREDNISONE 10 MG PO TABS: ORAL_TABLET | ORAL | Status: DC

## 2015-04-05 NOTE — Telephone Encounter (Signed)
Spoke with pt, states his sob is still present- also still wheezing.  Denies cough, chest pain, fever, mucus production.  Pt states he doesn't tolerate albuterol neb well- makes his heart race.   Pt uses [facility].    BQ please advise on further recs.  Thanks.

## 2015-04-05 NOTE — Telephone Encounter (Signed)
Prednisone taper: Take 40mg  po daily for 3 days, then take 30mg  po daily for 3 days, then take 20mg  po daily for two days, then take 10mg  po daily for 2 days  HE NEEDS TO BE SEEN within 24-48 hours

## 2015-04-05 NOTE — Telephone Encounter (Signed)
Called pt and is aware of recs. RX for prednisone sent in. Pt scheduled to see TP 3/9 at 2pm

## 2015-04-07 ENCOUNTER — Telehealth: Payer: Self-pay | Admitting: Adult Health

## 2015-04-07 ENCOUNTER — Encounter: Payer: Self-pay | Admitting: Adult Health

## 2015-04-07 ENCOUNTER — Ambulatory Visit (INDEPENDENT_AMBULATORY_CARE_PROVIDER_SITE_OTHER): Payer: BLUE CROSS/BLUE SHIELD | Admitting: Adult Health

## 2015-04-07 VITALS — BP 132/74 | HR 71 | Temp 97.6°F | Ht 68.0 in | Wt 192.0 lb

## 2015-04-07 DIAGNOSIS — J441 Chronic obstructive pulmonary disease with (acute) exacerbation: Secondary | ICD-10-CM | POA: Diagnosis not present

## 2015-04-07 DIAGNOSIS — J9611 Chronic respiratory failure with hypoxia: Secondary | ICD-10-CM | POA: Diagnosis not present

## 2015-04-07 MED ORDER — PREDNISONE 10 MG PO TABS: ORAL_TABLET | ORAL | Status: DC

## 2015-04-07 NOTE — Telephone Encounter (Signed)
Called and spoke to Journalist, newspaper at [employer]. Sarah requested clarification on the prednisone signature as the latter part of the signature does not states for how many weeks to continue the 1/2 tab. Lucile Crater that the 1/2 tab should be continued for 1 week per TP's OV note. Sarah verbalized understanding and denied any further questions or concerns at this time.   Patient Instructions     Take Prednisone taper as directed until 10mg  daily -then 1 tab daily for 1 week then 1/2 tab daily for 1 week and stop.  Continue on Stiolto daily .  Follow up with primary MD as discussed  Please contact office for sooner follow up if symptoms do not improve or worsen or seek emergency care  Follow up Dr. Lake Bells in 3 months and As needed

## 2015-04-07 NOTE — [facility] Notes (Signed)
Subjective:    Patient ID: Gilbert Jefferson, male    DOB: [birth year], 65 y.o.   MRN: [id number]  HPI 65 yo male former smoker with Copd gold D   TEST  01/18/2012 ONO RA normal Arlyce Harman 10/08/12: FeV1 29% FeV1/FVC 41% Alpha one antitrypsin 04/10/2013>>normal 145 Spiro 04/10/2013>> FeV1 26% FVC 56% FeV1/FVC 53% patient COPD   04/07/2015  Acute OV : COPD  Pt returns for persistent symptoms. He complains of persistent dyspnea.  Cough , congestion , wheezing are resolved but has lingering dyspnea.  He was seen 2 weeks ago for slow to resolve COPD flare  , tx w/ Levaquin and prednisone taper .  Was tx 2 weeks earlier with Doxycycline and prednisone .  CXR showed COPD changes w/ no acute process. And CBC was ok.  He is on Stiolto daily  He was called in prednisone taper yesterday , he is feeling better.  Seems to get worse when he gets off prednisone .  Feels some anxiety .  On O2 2l/m rest and 4l/m walking  nebulizers did not help, caused his heart to race and burning sensation in his chest. Resolved after neb.  Denies any chest pain, exertional chest pain, jaw pain, syncope, or palpitations.     Past Medical History  Diagnosis Date  . Hypertension   . COPD (chronic obstructive pulmonary disease) ([place])   . Osteoarthrosis   . Varicose veins    Current Outpatient Prescriptions on File Prior to Visit  Medication Sig Dispense Refill  . albuterol (PROAIR HFA) 108 (90 BASE) MCG/ACT inhaler Inhale 1-2 puffs into the lungs every 6 (six) hours as needed for shortness of breath. 3 Inhaler 3  . albuterol (PROVENTIL) (2.5 MG/3ML) 0.083% nebulizer solution Take 3 mLs (2.5 mg total) by nebulization every 6 (six) hours as needed for wheezing or shortness of breath. 75 mL 12  . predniSONE (DELTASONE) 10 MG tablet Take 40mg  po daily for 3 days, then take 30mg  po daily for 3 days, then take 20mg  po daily for two days, then take 10mg  po daily for 2 days 27 tablet 0  . Tiotropium Bromide-Olodaterol (STIOLTO  RESPIMAT) 2.5-2.5 MCG/ACT AERS Inhale 2 puffs into the lungs daily. 3 Inhaler 4   No current facility-administered medications on file prior to visit.    Review of Systems Constitutional:   No  weight loss, night sweats,  Fevers, chills,  +fatigue, or  lassitude.  HEENT:   No headaches,  Difficulty swallowing,  Tooth/dental problems, or  Sore throat,                No sneezing, itching, ear ache, + nasal congestion, post nasal drip,   CV:  No chest pain,  Orthopnea, PND, swelling in lower extremities, anasarca, dizziness, palpitations, syncope.   GI  No heartburn, indigestion, abdominal pain, nausea, vomiting, diarrhea, change in bowel habits, loss of appetite, bloody stools.   Resp:    No chest wall deformity  Skin: no rash or lesions.  GU: no dysuria, change in color of urine, no urgency or frequency.  No flank pain, no hematuria   MS:  No joint pain or swelling.  No decreased range of motion.  No back pain.  Psych:  No change in mood or affect. No depression or anxiety.  No memory loss.         Objective:   Physical Exam   Filed Vitals:   04/07/15 1409  BP: 132/74  Pulse: 71  Temp: 97.6 F (36.4  C)  TempSrc: Oral  Height: 5\' 8"  (1.727 m)  Weight: 192 lb (87.091 kg)  SpO2: 96%    GEN: A/Ox3; pleasant , NAD, chronically ill appearing   HEENT:  [place]/AT,  EACs-clear, TMs-wnl, NOSE-clear, THROAT-clear, no lesions, no postnasal drip or exudate noted.   NECK:  Supple w/ fair ROM; no JVD; normal carotid impulses w/o bruits; no thyromegaly or nodules palpated; no lymphadenopathy.  RESP  Decreased BS in bases no accessory muscle use, no dullness to percussion  CARD:  RRR, no m/r/g  , no peripheral edema, pulses intact, no cyanosis or clubbing.  GI:   Soft & nt; nml bowel sounds; no organomegaly or masses detected.  Musco: Warm bil, no deformities or joint swelling noted.   Neuro: alert, no focal deficits noted.    Skin: Warm, no lesions or rashes  Susann Lawhorne  NP-C  [facility]  04/07/2015        Assessment & Plan:

## 2015-04-07 NOTE — Assessment & Plan Note (Signed)
Slow to resolve flare , congestion has improved,. CXR showed no acute process.  Exam is unrevealing . Seems to get better on steroids.  o2 sats are good on O2 at 2l/m  Does complain of chronic anxiety , previously on celexa -did not like , advised to make ov with PCP to discuss   Plan  Take Prednisone taper as directed until 10mg  daily -then 1 tab daily for 1 week then 1/2 tab daily for 1 week and stop.  Continue on Stiolto daily .  Follow up with primary MD as discussed  Please contact office for sooner follow up if symptoms do not improve or worsen or seek emergency care  Follow up Dr. Lake Bells in 3 months and As needed

## 2015-04-07 NOTE — Assessment & Plan Note (Signed)
Cont on O2 .  

## 2015-04-07 NOTE — Patient Instructions (Signed)
Take Prednisone taper as directed until 10mg  daily -then 1 tab daily for 1 week then 1/2 tab daily for 1 week and stop.  Continue on Stiolto daily .  Follow up with primary MD as discussed  Please contact office for sooner follow up if symptoms do not improve or worsen or seek emergency care  Follow up Dr. Lake Bells in 3 months and As needed

## 2015-04-08 NOTE — [facility] Notes (Signed)
Reviewed, tough case as significant COPD symptoms and anxiety. Agree with prolonged prednisone taper.

## 2015-04-26 ENCOUNTER — Ambulatory Visit: Payer: BLUE CROSS/BLUE SHIELD | Admitting: Pulmonary Disease

## 2015-05-26 ENCOUNTER — Other Ambulatory Visit (INDEPENDENT_AMBULATORY_CARE_PROVIDER_SITE_OTHER): Payer: BLUE CROSS/BLUE SHIELD

## 2015-05-26 ENCOUNTER — Ambulatory Visit (INDEPENDENT_AMBULATORY_CARE_PROVIDER_SITE_OTHER): Payer: BLUE CROSS/BLUE SHIELD | Admitting: Pulmonary Disease

## 2015-05-26 ENCOUNTER — Encounter: Payer: Self-pay | Admitting: Pulmonary Disease

## 2015-05-26 VITALS — BP 138/70 | HR 76 | Ht 68.0 in | Wt 191.0 lb

## 2015-05-26 DIAGNOSIS — Z5181 Encounter for therapeutic drug level monitoring: Secondary | ICD-10-CM | POA: Diagnosis not present

## 2015-05-26 DIAGNOSIS — J441 Chronic obstructive pulmonary disease with (acute) exacerbation: Secondary | ICD-10-CM

## 2015-05-26 LAB — HEPATIC FUNCTION PANEL
ALT: 11 U/L (ref 0–53)
AST: 16 U/L (ref 0–37)
Albumin: 4.5 g/dL (ref 3.5–5.2)
Alkaline Phosphatase: 66 U/L (ref 39–117)
BILIRUBIN TOTAL: 0.8 mg/dL (ref 0.2–1.2)
Bilirubin, Direct: 0.1 mg/dL (ref 0.0–0.3)
TOTAL PROTEIN: 7.1 g/dL (ref 6.0–8.3)

## 2015-05-26 MED ORDER — AZITHROMYCIN 250 MG PO TABS: 250.0000 mg | ORAL_TABLET | Freq: Every day | ORAL | Status: DC

## 2015-05-26 NOTE — Assessment & Plan Note (Signed)
Gilbert Jefferson has very severe COPD with recurrent exacerbations. Currently, he is quite symptomatic with I think is due mostly due to severe airflow obstruction but also due to some deconditioning.  We need to get him back on treatment to prevent recurrent exacerbations, specifically daily azithromycin. He also needs to participate in pulmonary rehabilitation in order to improve the deconditioning.  If we do not see improvement with pulmonary rehabilitation and daily azithromycin that I think he needs to be evaluated for lung transplant by the end of the year. However, at this time I think we've got room for improvement.  Plan: Pulmonary rehabilitation referral *Daily azithromycin Continue Stiolto LFT today and every 3 months EKG today given that were starting azithromycin Very low risk of arrhythmia was discussed with the patient regarding azithromycin as well as auditory side effects. He was instructed to let us know if he has any sort of palpitations or fluttering. He was also instructed to let us know if he's had any trouble with hearing after starting the medication. Follow-up 3 months  > 50% of this visit spent face to face, 27 minute visit

## 2015-05-26 NOTE — Patient Instructions (Signed)
We will start daily azithromycin, let us know if you have heart palpitations or trouble hearing. You are to take the azithromycin every day We will refer you to pulmonary rehabilitation We will call you with the results of today's blood work I will see you back in 3 months or sooner if needed

## 2015-05-26 NOTE — [facility] Notes (Signed)
Subjective:    Patient ID: Gilbert Jefferson, male    DOB: 29-Oct-1950, 65 y.o.   MRN: [id number]  Synopsis: Former patient of Dr. Joya Gaskins with COPD Copd gold D  01/18/2012 ONO RA normal Arlyce Harman 10/08/12: FeV1 29% FeV1/FVC 41% Alpha one antitrypsin 04/10/2013>>normal 145 Spiro 04/10/2013>> FeV1 26% FVC 56% FeV1/FVC 53% patient COPD   HPI Chief Complaint  Patient presents with  . Follow-up    Pt c/o sob with and without exertion.  Pt states he gets anxious when he gets sob.  denies cp, mucus production, sinus congestion.     Hiren finally got better after the last flare of his COPD.  Tammy Parrett gave him a long taper of prednisone which helped a lot but it still took a long time for him to get over. He has been more anxious lately which sometimes makes him more short of breath. He has been back on the treadmill again, walking as much as 45 minutes a day and a mile at a time.  He has not done pulmonary rehab yet.  He looked into it before, but he was discouraged by the cost from this.  He is current on long term disability right now, but he is not on full Medicare.  He does not feel chest congestion.  He doesn't wheeze.  No leg swelling or chest pain.   He has not tried walking out side for exercise, mostly due to the [place] in his neighborhood.     Past Medical History  Diagnosis Date  . Hypertension   . COPD (chronic obstructive pulmonary disease) ([place])   . Osteoarthrosis   . Varicose veins       Review of Systems  Constitutional: Positive for fatigue. Negative for fever and chills.  HENT: Negative for rhinorrhea and sinus pressure.   Respiratory: Positive for cough and shortness of breath. Negative for wheezing.   Cardiovascular: Negative for chest pain, palpitations and leg swelling.       Objective:   Physical Exam Filed Vitals:   05/26/15 1000  BP: 138/70  Pulse: 76  Height: 5\' 8"  (1.727 m)  Weight: 191 lb (86.637 kg)  SpO2: 100%   RA  Gen: chronically ill  appearing HENT: OP clear, TM's clear, neck supple PULM: poor air movement today, no wheezing, normal percussion CV: RRR, no mgr, trace edema GI: BS+, soft, non tender Derm: no cyanosis or rash Psyche: normal mood and affect  CBC    Component Value Date/Time   WBC 7.0 03/24/2015 1624   RBC 4.64 03/24/2015 1624   HGB 13.8 03/24/2015 1624   HCT 40.9 03/24/2015 1624   PLT 254.0 03/24/2015 1624   MCV 88.1 03/24/2015 1624   MCH 31.6 04/28/2010 1621   MCHC 33.8 03/24/2015 1624   RDW 12.7 03/24/2015 1624   LYMPHSABS 1.1 03/24/2015 1624   MONOABS 1.2* 03/24/2015 1624   EOSABS 0.1 03/24/2015 1624   BASOSABS 0.0 03/24/2015 1624    BMET    Component Value Date/Time   NA 140 04/28/2010 1644   K 5.2* 04/28/2010 1644   CL 105 04/28/2010 1644   GLUCOSE 80 04/28/2010 1644   BUN 17 04/28/2010 1644   CREATININE 1.0 04/28/2010 1644          Assessment & Plan:  COPD (chronic obstructive pulmonary disease) Gold D Ekam has very severe COPD with recurrent exacerbations. Currently, he is quite symptomatic with I think is due mostly due to severe airflow obstruction but also due to some deconditioning.  We need to get him back on treatment to prevent recurrent exacerbations, specifically daily azithromycin. He also needs to participate in pulmonary rehabilitation in order to improve the deconditioning.  If we do not see improvement with pulmonary rehabilitation and daily azithromycin that I think he needs to be evaluated for lung transplant by the end of the year. However, at this time I think we've got room for improvement.  Plan: Pulmonary rehabilitation referral *Daily azithromycin Continue Stiolto LFT today and every 3 months EKG today given that were starting azithromycin Very low risk of arrhythmia was discussed with the patient regarding azithromycin as well as auditory side effects. He was instructed to let us know if he has any sort of palpitations or fluttering. He was also  instructed to let us know if he's had any trouble with hearing after starting the medication. Follow-up 3 months  > 50% of this visit spent face to face, 27 minute visit     Current outpatient prescriptions:  .  albuterol (PROAIR HFA) 108 (90 BASE) MCG/ACT inhaler, Inhale 1-2 puffs into the lungs every 6 (six) hours as needed for shortness of breath., Disp: 3 Inhaler, Rfl: 3 .  Tiotropium Bromide-Olodaterol (STIOLTO RESPIMAT) 2.5-2.5 MCG/ACT AERS, Inhale 2 puffs into the lungs daily., Disp: 3 Inhaler, Rfl: 4 .  azithromycin (ZITHROMAX) 250 MG tablet, Take 1 tablet (250 mg total) by mouth daily., Disp: 30 tablet, Rfl: 3

## 2015-06-16 ENCOUNTER — Telehealth: Payer: Self-pay | Admitting: Pulmonary Disease

## 2015-06-16 DIAGNOSIS — J432 Centrilobular emphysema: Secondary | ICD-10-CM

## 2015-06-16 NOTE — Telephone Encounter (Signed)
LMTC x 1 for Molly at pulm rehab

## 2015-06-17 NOTE — Telephone Encounter (Signed)
Per Cloyde Reams, pt has no PFT's - pt cannot have the dx of COPD without supporting documentation.  Please advise Dr Lake Bells of another dx that can be used. Thanks.

## 2015-06-17 NOTE — Telephone Encounter (Signed)
Order placed and LM for [facility]

## 2015-06-17 NOTE — Telephone Encounter (Signed)
LMTCB for Gilbert Jefferson 

## 2015-06-17 NOTE — Telephone Encounter (Signed)
Gilbert Jefferson cb, [phone] °

## 2015-06-17 NOTE — Telephone Encounter (Signed)
I have reviewed his CXR images today, he has emphysema so please change the indication to centrilobular emphysema

## 2015-06-20 NOTE — Telephone Encounter (Signed)
Spoke with Marcie Bal at [facility], aware of order placed.  Nothing further needed.

## 2015-06-22 ENCOUNTER — Other Ambulatory Visit: Payer: Self-pay | Admitting: Critical Care Medicine

## 2015-06-29 ENCOUNTER — Telehealth: Payer: Self-pay | Admitting: Pulmonary Disease

## 2015-06-29 NOTE — Telephone Encounter (Signed)
lmtcb x1 for pt. 

## 2015-06-30 ENCOUNTER — Ambulatory Visit (HOSPITAL_COMMUNITY): Payer: Self-pay

## 2015-06-30 DIAGNOSIS — J432 Centrilobular emphysema: Secondary | ICD-10-CM

## 2015-06-30 MED ORDER — AZITHROMYCIN 250 MG PO TABS: 250.0000 mg | ORAL_TABLET | Freq: Every day | ORAL | Status: DC

## 2015-06-30 NOTE — Telephone Encounter (Signed)
Spoke with pt. He needs a 90 day supply of azithromycin sent to [facility]. This has been sent in. Nothing further was needed.

## 2015-06-30 NOTE — Telephone Encounter (Signed)
Pt returning call.Gilbert Jefferson ° °

## 2015-06-30 NOTE — Telephone Encounter (Signed)
lmtcb X2 for pt.  

## 2015-08-25 ENCOUNTER — Ambulatory Visit (INDEPENDENT_AMBULATORY_CARE_PROVIDER_SITE_OTHER): Payer: BLUE CROSS/BLUE SHIELD | Admitting: Pulmonary Disease

## 2015-08-25 ENCOUNTER — Encounter: Payer: Self-pay | Admitting: Pulmonary Disease

## 2015-08-25 VITALS — BP 134/70 | HR 84 | Ht 68.0 in | Wt 187.0 lb

## 2015-08-25 DIAGNOSIS — J432 Centrilobular emphysema: Secondary | ICD-10-CM | POA: Diagnosis not present

## 2015-08-25 DIAGNOSIS — J449 Chronic obstructive pulmonary disease, unspecified: Secondary | ICD-10-CM | POA: Diagnosis not present

## 2015-08-25 DIAGNOSIS — J9611 Chronic respiratory failure with hypoxia: Secondary | ICD-10-CM | POA: Diagnosis not present

## 2015-08-25 NOTE — Progress Notes (Signed)
[facility]   Chief Complaint  Patient presents with  . Follow-up    pt states he is doing well, sob with exertion, no new complaints today.       Current Outpatient Prescriptions on File Prior to Visit  Medication Sig  . albuterol (PROAIR HFA) 108 (90 BASE) MCG/ACT inhaler Inhale 1-2 puffs into the lungs every 6 (six) hours as needed for shortness of breath.  Marland Kitchen azithromycin (ZITHROMAX) 250 MG tablet Take 1 tablet (250 mg total) by mouth daily.  Marland Kitchen STIOLTO RESPIMAT 2.5-2.5 MCG/ACT AERS USE 2 INHALATIONS DAILY   No current facility-administered medications on file prior to visit.      Studies:   Past Medical Hx:  has a past medical history of COPD (chronic obstructive pulmonary disease) ([place]); Hypertension; Osteoarthrosis; and Varicose veins.   Past Surgical hx, Allergies, Family hx, Social hx all reviewed.  Vital Signs BP 134/70 (BP Location: Left Arm, Cuff Size: Normal)   Pulse 84   Ht 5\' 8"  (1.727 m)   Wt 187 lb (84.8 kg)   SpO2 97%   BMI 28.43 kg/m   History of Present Illness Gilbert Jefferson is a 65 y.o. male with a history of Gold D COPD and 3L pulse O2 dependent chronic hypoxemic respiratory failure (followed by Dr. Lake Bells) who presented to the pulmonary office for 3 month follow up.    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  Heat has been bothering him.  Attempts to go to the [facility] to walk on the the treadmill for 1 mile 2-3 times per week, swims with his 69 year old son.      He recently started buspar per PCP, still has periods of anxiety.  On buspar for 3 months.  He reports some improvement in anxiety. Most of anxiety seems to surround upcoming insurance change from private insurance ([place]) to [employer].  He reports his last Prednisone use was 4 months ago.  He previously averaged 4x per year.  Feels better on azithro with less exacerbations. He reports he rarely has to use his albuterol inhaler.  Physical  Exam  General - well developed adult M in no acute distress ENT - No sinus tenderness, no oral exudate, no LAN Cardiac - s1s2 regular, no murmur Chest - even/non-labored, lungs bilaterally distant but clear. No wheeze/rales Back - No focal tenderness Abd - Soft, non-tender Ext - No edema Neuro - Normal strength Skin - No rashes Psych - normal mood, and behavior   Assessment/Plan  GOLD D COPD  Plan: Continue Stiolto  Continue daily azithromycin PRN albuterol as needed  Encouraged ongoing exercise Assess EKG with azithromycin use Follow up with Dr. Lake Bells in 3 months  Discussed red flags for COPD exacerbation  Chronic Hypoxic Respiratory Failure   Plan: Continue 3L pulse O2 use  Patient Instructions  1.  Continue your Siolto as prescribed 2.  Continue daily azithromycin  3.  Red Flags for COPD - increased cough, increased sputum production, increased shortness of breath, change in color of sputum production.  Please call if you notice any of these symptoms.  Our goal is to keep you out of the hospital. 4. Continue your exercise efforts!  You are doing a great job! 5. Follow up with Dr. Lake Bells in 3 months or sooner if new needs arise 6. Call if you have any questions or concerns with your upcoming insurance change     Gilbert Gens, NP-C [facility]  [phone] 08/25/2015, 2:23 PM

## 2015-08-25 NOTE — Patient Instructions (Addendum)
1.  Continue your Siolto as prescribed 2.  Continue daily azithromycin  3.  Red Flags for COPD - increased cough, increased sputum production, increased shortness of breath, change in color of sputum production.  Please call if you notice any of these symptoms.  Our goal is to keep you out of the hospital. 4. Continue your exercise efforts!  You are doing a great job! 5. Follow up with Dr. Lake Bells in 3 months or sooner if new needs arise 6. Call if you have any questions or concerns with your upcoming insurance change

## 2015-08-25 NOTE — Progress Notes (Signed)
Attending:  I have seen and examined the patient today with Gilbert Jefferson and I agree with the findings from her note.  This is been a stable interval for Gilbert Jefferson. He's only had 1 exacerbation of COPD this year he continues to take azithromycin daily. His breathing has been fine recently that he's nervous about changing to Medicare  On exam: Diminished breath sounds bilaterally but clear, no wheezing Good spirits, Cardiovascular regular rate and rhythm no murmurs gallops or rubs  Twelve-lead EKG performed today shows no evidence of QT prolongation or any sort of conduction delay  Severe COPD with recurrent exacerbations: Continue current inhaled regimen, continue daily azithromycin for least one year from the original start date (approximately March 2018 will be 1 year)  Chronic risk for a fire with hypoxemia: Continue oxygen  Rest as above  Roselie Awkward, MD [facility] Pager: [phone] Cell: [phone] After 3pm or if no response, call [phone]

## 2015-09-08 ENCOUNTER — Encounter (HOSPITAL_COMMUNITY)
Admission: RE | Admit: 2015-09-08 | Discharge: 2015-09-08 | Disposition: A | Discharge status: Home / Self Care | Payer: Self-pay | Source: Ambulatory Visit | Attending: Pulmonary Disease | Admitting: Pulmonary Disease

## 2015-09-08 ENCOUNTER — Encounter (HOSPITAL_COMMUNITY)
Admission: RE | Admit: 2015-09-08 | Discharge: 2015-09-08 | Disposition: A | Discharge status: Home / Self Care | Payer: Self-pay | Source: Ambulatory Visit

## 2015-09-08 DIAGNOSIS — J432 Centrilobular emphysema: Secondary | ICD-10-CM | POA: Insufficient documentation

## 2015-09-09 ENCOUNTER — Telehealth: Payer: Self-pay | Admitting: Pulmonary Disease

## 2015-09-09 NOTE — Telephone Encounter (Signed)
Called and lm for molly to call back on Monday.

## 2015-09-12 ENCOUNTER — Encounter (HOSPITAL_COMMUNITY): Payer: Self-pay

## 2015-09-12 NOTE — Telephone Encounter (Signed)
Called spoke with [facility]. She reports they did a 6MW test the other day. Pt is going to do the maintenance program since pt has a high copay. They tested pt on his POC 3 liters pulse and dropped. Pt was placed on 5 liters pulsed and dropped into the 70"s. She does not feel the POC is appropriate for him.  I called pt and appt scheduled to come in and see TP on Wednesday for OV. Nothing further needed

## 2015-09-12 NOTE — Telephone Encounter (Signed)
[phone], molly from pulm rehab cb

## 2015-09-14 ENCOUNTER — Encounter: Payer: Self-pay | Admitting: Adult Health

## 2015-09-14 ENCOUNTER — Encounter (HOSPITAL_COMMUNITY): Payer: Self-pay

## 2015-09-14 ENCOUNTER — Ambulatory Visit (INDEPENDENT_AMBULATORY_CARE_PROVIDER_SITE_OTHER): Payer: BLUE CROSS/BLUE SHIELD | Admitting: Adult Health

## 2015-09-14 DIAGNOSIS — J449 Chronic obstructive pulmonary disease, unspecified: Secondary | ICD-10-CM | POA: Diagnosis not present

## 2015-09-14 DIAGNOSIS — J9611 Chronic respiratory failure with hypoxia: Secondary | ICD-10-CM | POA: Diagnosis not present

## 2015-09-14 NOTE — Progress Notes (Signed)
Subjective:    Patient ID: Gilbert Jefferson, male    DOB: 1950-11-13, 65 y.o.   MRN: [id number]  HPI 65 yo male former smoker with Copd gold D   TEST  01/18/2012 ONO RA normal Arlyce Harman 10/08/12: FeV1 29% FeV1/FVC 41% Alpha one antitrypsin 04/10/2013>>normal 145 Spiro 04/10/2013>> FeV1 26% FVC 56% FeV1/FVC 53% patient COPD   09/14/2015  Acute OV : COPD  Pt referred over from pulmonary rehab. Was noted to have low o2 on pulse oxygen device with  Walk test on 5l/m . He went to first evaluation visit.  Walk test in office today on O2 2l/m pulsing with no desats, O2 sats remained at 94-95% . We used forehead probe. And finger probe  He says he is feeling okay. Does feel humid weather makes his breathing more difficult.  He is on Stiolto daily  On O2 2l/m rest and 4l/m walking with POC at home.  Denies any chest pain, exertional chest pain, jaw pain, syncope, or palpitations., discolored mucus or edema.  CXR 03/2015 with chronic changes .  PVX and Prevnar are utd.     Past Medical History:  Diagnosis Date  . COPD (chronic obstructive pulmonary disease) ([place])   . Hypertension   . Osteoarthrosis   . Varicose veins    Current Outpatient Prescriptions on File Prior to Visit  Medication Sig Dispense Refill  . albuterol (PROAIR HFA) 108 (90 BASE) MCG/ACT inhaler Inhale 1-2 puffs into the lungs every 6 (six) hours as needed for shortness of breath. 3 Inhaler 3  . azithromycin (ZITHROMAX) 250 MG tablet Take 1 tablet (250 mg total) by mouth daily. 90 tablet 1  . busPIRone (BUSPAR) 15 MG tablet Take 7.5 mg by mouth 2 (two) times daily.    Marland Kitchen STIOLTO RESPIMAT 2.5-2.5 MCG/ACT AERS USE 2 INHALATIONS DAILY 12 g 3   No current facility-administered medications on file prior to visit.     Review of Systems Constitutional:   No  weight loss, night sweats,  Fevers, chills,  +fatigue, or  lassitude.  HEENT:   No headaches,  Difficulty swallowing,  Tooth/dental problems, or  Sore throat,                No  sneezing, itching, ear ache, nasal congestion, post nasal drip,   CV:  No chest pain,  Orthopnea, PND, swelling in lower extremities, anasarca, dizziness, palpitations, syncope.   GI  No heartburn, indigestion, abdominal pain, nausea, vomiting, diarrhea, change in bowel habits, loss of appetite, bloody stools.   Resp:    No chest wall deformity  Skin: no rash or lesions.  GU: no dysuria, change in color of urine, no urgency or frequency.  No flank pain, no hematuria   MS:  No joint pain or swelling.  No decreased range of motion.  No back pain.  Psych:  No change in mood or affect. No depression or anxiety.  No memory loss.         Objective:   Physical Exam   Vitals:   09/14/15 1155  BP: 140/74  Pulse: 64  Temp: 97.7 F (36.5 C)  TempSrc: Oral  SpO2: 98%  Weight: 186 lb (84.4 kg)  Height: 5\' 8"  (1.727 m)    GEN: A/Ox3; pleasant , NAD, elderly on o2    HEENT:  [place]/AT,  EACs-clear, TMs-wnl, NOSE-clear, THROAT-clear, no lesions, no postnasal drip or exudate noted.   NECK:  Supple w/ fair ROM; no JVD; normal carotid impulses w/o bruits; no thyromegaly  or nodules palpated; no lymphadenopathy.    RESP  Decreased BS in bases  no accessory muscle use, no dullness to percussion  CARD:  RRR, no m/r/g  , no peripheral edema, pulses intact, no cyanosis or clubbing.  GI:   Soft & nt; nml bowel sounds; no organomegaly or masses detected.   Musco: Warm bil, no deformities or joint swelling noted.   Neuro: alert, no focal deficits noted.    Skin: Warm, no lesions or rashes  Tammy Parrett NP-C  [facility]  09/14/2015        Assessment & Plan:

## 2015-09-14 NOTE — Assessment & Plan Note (Signed)
No desaturations on oxygen. Today in the office May return back to pulmonary rehabilitation  Plan  Patient Instructions  Continue on o2 2l/m rest and 4l/m walking/exercise. Goal to keep O2 sat >88-90%.  May return to pulmonary rehab  Continue on Stiolto daily .  Please contact office for sooner follow up if symptoms do not improve or worsen or seek emergency care  Follow up Dr. Lake Bells in 3-4  months and As needed

## 2015-09-14 NOTE — Patient Instructions (Signed)
Continue on o2 2l/m rest and 4l/m walking/exercise. Goal to keep O2 sat >88-90%.  May return to pulmonary rehab  Continue on Stiolto daily .  Please contact office for sooner follow up if symptoms do not improve or worsen or seek emergency care  Follow up Dr. Lake Bells in 3-4  months and As needed

## 2015-09-14 NOTE — Assessment & Plan Note (Signed)
Compensated without flare  Plan  Patient Instructions  Continue on o2 2l/m rest and 4l/m walking/exercise. Goal to keep O2 sat >88-90%.  May return to pulmonary rehab  Continue on Stiolto daily .  Please contact office for sooner follow up if symptoms do not improve or worsen or seek emergency care  Follow up Dr. Lake Bells in 3-4  months and As needed

## 2015-09-16 ENCOUNTER — Encounter (HOSPITAL_COMMUNITY): Payer: Self-pay

## 2015-09-19 ENCOUNTER — Encounter (HOSPITAL_COMMUNITY): Payer: Self-pay

## 2015-09-19 NOTE — Progress Notes (Signed)
Chart reviewed, I agree with this plan of care

## 2015-09-21 ENCOUNTER — Encounter (HOSPITAL_COMMUNITY)
Admission: RE | Admit: 2015-09-21 | Discharge: 2015-09-21 | Disposition: A | Discharge status: Home / Self Care | Payer: Self-pay | Source: Ambulatory Visit | Attending: Pulmonary Disease | Admitting: Pulmonary Disease

## 2015-09-23 ENCOUNTER — Encounter (HOSPITAL_COMMUNITY)
Admission: RE | Admit: 2015-09-23 | Discharge: 2015-09-23 | Disposition: A | Discharge status: Home / Self Care | Payer: Self-pay | Source: Ambulatory Visit | Attending: Pulmonary Disease | Admitting: Pulmonary Disease

## 2015-09-26 ENCOUNTER — Encounter (HOSPITAL_COMMUNITY)
Admission: RE | Admit: 2015-09-26 | Discharge: 2015-09-26 | Disposition: A | Discharge status: Home / Self Care | Payer: Self-pay | Source: Ambulatory Visit | Attending: Pulmonary Disease | Admitting: Pulmonary Disease

## 2015-09-27 NOTE — Progress Notes (Signed)
Gilbert Jefferson started Pulmonary Maintenance program 09/21/15. He is tolerating exercise well on O2 3L. His oxygen saturations have been 90-99% before, during and after exercise. He is scheduled to attend the Monday, Wednesday and Friday classes.

## 2015-09-28 ENCOUNTER — Encounter (HOSPITAL_COMMUNITY)
Admission: RE | Admit: 2015-09-28 | Discharge: 2015-09-28 | Disposition: A | Discharge status: Home / Self Care | Payer: Self-pay | Source: Ambulatory Visit | Attending: Pulmonary Disease | Admitting: Pulmonary Disease

## 2015-09-30 ENCOUNTER — Encounter (HOSPITAL_COMMUNITY)
Admission: RE | Admit: 2015-09-30 | Discharge: 2015-09-30 | Disposition: A | Discharge status: Home / Self Care | Payer: Self-pay | Source: Ambulatory Visit | Attending: Pulmonary Disease | Admitting: Pulmonary Disease

## 2015-09-30 DIAGNOSIS — J432 Centrilobular emphysema: Secondary | ICD-10-CM | POA: Insufficient documentation

## 2015-10-03 ENCOUNTER — Encounter (HOSPITAL_COMMUNITY): Payer: Self-pay

## 2015-10-05 ENCOUNTER — Encounter (HOSPITAL_COMMUNITY)
Admission: RE | Admit: 2015-10-05 | Discharge: 2015-10-05 | Disposition: A | Discharge status: Home / Self Care | Payer: Self-pay | Source: Ambulatory Visit | Attending: Pulmonary Disease | Admitting: Pulmonary Disease

## 2015-10-07 ENCOUNTER — Encounter (HOSPITAL_COMMUNITY)
Admission: RE | Admit: 2015-10-07 | Discharge: 2015-10-07 | Disposition: A | Discharge status: Home / Self Care | Payer: Self-pay | Source: Ambulatory Visit | Attending: Pulmonary Disease | Admitting: Pulmonary Disease

## 2015-10-10 ENCOUNTER — Encounter (HOSPITAL_COMMUNITY): Payer: Self-pay

## 2015-10-12 ENCOUNTER — Encounter (HOSPITAL_COMMUNITY)
Admission: RE | Admit: 2015-10-12 | Discharge: 2015-10-12 | Disposition: A | Discharge status: Home / Self Care | Payer: Self-pay | Source: Ambulatory Visit | Attending: Pulmonary Disease | Admitting: Pulmonary Disease

## 2015-10-14 ENCOUNTER — Telehealth: Payer: Self-pay | Admitting: Pulmonary Disease

## 2015-10-14 ENCOUNTER — Encounter (HOSPITAL_COMMUNITY)
Admission: RE | Admit: 2015-10-14 | Discharge: 2015-10-14 | Disposition: A | Discharge status: Home / Self Care | Payer: Self-pay | Source: Ambulatory Visit | Attending: Pulmonary Disease | Admitting: Pulmonary Disease

## 2015-10-14 MED ORDER — PREDNISONE 10 MG PO TABS: ORAL_TABLET | ORAL | 0 refills | Status: DC

## 2015-10-14 NOTE — Telephone Encounter (Signed)
Spoke with pt, c/o increased SOB, chest tightness, rattling in chest, prod cough with clear/white mucus, increased cough X2-3 days.   Denies fever, sinus congestion.    Pt is taking maintenance azithromycin but nothing for acute symptoms.    [facility] uses [facility].    Sending to DOD as BQ is unavailable today.    MW please advise on recs.  Thanks!   Instructions     Return in about 4 months (around 01/14/2016) for Follow up Dr. Lake Bells.  Continue on o2 2l/m rest and 4l/m walking/exercise. Goal to keep O2 sat >88-90%.  May return to pulmonary rehab  Continue on Stiolto daily .  Please contact office for sooner follow up if symptoms do not improve or worsen or seek emergency care  Follow up Dr. Lake Bells in 3-4  months and As needed

## 2015-10-14 NOTE — Telephone Encounter (Signed)
Called spoke with pt. Aware of recs below. Rx has been sent in. Nothing further needed.

## 2015-10-14 NOTE — Telephone Encounter (Signed)
Prednisone 10 mg take  4 each am x 2 days,   2 each am x 2 days,  1 each am x 2 days and stop ov if not improving - if getting worse to ER

## 2015-10-17 ENCOUNTER — Encounter (HOSPITAL_COMMUNITY)
Admission: RE | Admit: 2015-10-17 | Discharge: 2015-10-17 | Disposition: A | Discharge status: Home / Self Care | Payer: Self-pay | Source: Ambulatory Visit | Attending: Pulmonary Disease | Admitting: Pulmonary Disease

## 2015-10-19 ENCOUNTER — Encounter (HOSPITAL_COMMUNITY)
Admission: RE | Admit: 2015-10-19 | Discharge: 2015-10-19 | Disposition: A | Discharge status: Home / Self Care | Payer: Self-pay | Source: Ambulatory Visit | Attending: Pulmonary Disease | Admitting: Pulmonary Disease

## 2015-10-21 ENCOUNTER — Encounter (HOSPITAL_COMMUNITY)
Admission: RE | Admit: 2015-10-21 | Discharge: 2015-10-21 | Disposition: A | Discharge status: Home / Self Care | Payer: Self-pay | Source: Ambulatory Visit | Attending: Pulmonary Disease | Admitting: Pulmonary Disease

## 2015-10-24 ENCOUNTER — Encounter (HOSPITAL_COMMUNITY)
Admission: RE | Admit: 2015-10-24 | Discharge: 2015-10-24 | Disposition: A | Discharge status: Home / Self Care | Payer: Self-pay | Source: Ambulatory Visit | Attending: Pulmonary Disease | Admitting: Pulmonary Disease

## 2015-10-26 ENCOUNTER — Encounter (HOSPITAL_COMMUNITY)
Admission: RE | Admit: 2015-10-26 | Discharge: 2015-10-26 | Disposition: A | Discharge status: Home / Self Care | Payer: Self-pay | Source: Ambulatory Visit | Attending: Pulmonary Disease | Admitting: Pulmonary Disease

## 2015-10-28 ENCOUNTER — Encounter (HOSPITAL_COMMUNITY)
Admission: RE | Admit: 2015-10-28 | Discharge: 2015-10-28 | Disposition: A | Discharge status: Home / Self Care | Payer: Self-pay | Source: Ambulatory Visit | Attending: Pulmonary Disease | Admitting: Pulmonary Disease

## 2015-10-31 ENCOUNTER — Encounter (HOSPITAL_COMMUNITY)
Admission: RE | Admit: 2015-10-31 | Discharge: 2015-10-31 | Disposition: A | Discharge status: Home / Self Care | Payer: Self-pay | Source: Ambulatory Visit | Attending: Pulmonary Disease | Admitting: Pulmonary Disease

## 2015-10-31 DIAGNOSIS — J432 Centrilobular emphysema: Secondary | ICD-10-CM | POA: Insufficient documentation

## 2015-11-02 ENCOUNTER — Encounter (HOSPITAL_COMMUNITY)
Admission: RE | Admit: 2015-11-02 | Discharge: 2015-11-02 | Disposition: A | Discharge status: Home / Self Care | Payer: Self-pay | Source: Ambulatory Visit | Attending: Pulmonary Disease | Admitting: Pulmonary Disease

## 2015-11-04 ENCOUNTER — Encounter (HOSPITAL_COMMUNITY)
Admission: RE | Admit: 2015-11-04 | Discharge: 2015-11-04 | Disposition: A | Discharge status: Home / Self Care | Payer: Self-pay | Source: Ambulatory Visit | Attending: Pulmonary Disease | Admitting: Pulmonary Disease

## 2015-11-07 ENCOUNTER — Encounter (HOSPITAL_COMMUNITY)
Admission: RE | Admit: 2015-11-07 | Discharge: 2015-11-07 | Disposition: A | Discharge status: Home / Self Care | Payer: Self-pay | Source: Ambulatory Visit | Attending: Pulmonary Disease | Admitting: Pulmonary Disease

## 2015-11-09 ENCOUNTER — Encounter (HOSPITAL_COMMUNITY)
Admission: RE | Admit: 2015-11-09 | Discharge: 2015-11-09 | Disposition: A | Discharge status: Home / Self Care | Payer: Self-pay | Source: Ambulatory Visit | Attending: Pulmonary Disease | Admitting: Pulmonary Disease

## 2015-11-09 ENCOUNTER — Telehealth: Payer: Self-pay | Admitting: Pulmonary Disease

## 2015-11-09 MED ORDER — PREDNISONE 10 MG PO TABS: ORAL_TABLET | ORAL | 0 refills | Status: DC

## 2015-11-09 NOTE — Telephone Encounter (Signed)
Pt returning call.Gilbert Jefferson ° °

## 2015-11-09 NOTE — Telephone Encounter (Signed)
Spoke with pt. He is requesting a refill on prednisone. Reports cough, wheezing, SOB. Cough is producing green mucus in the AM. Denies chest tightness or fever. Symptoms started yesterday. States that when he produces green mucus BQ wants him to call for pred taper.  BQ - please advise. Thanks.

## 2015-11-09 NOTE — Telephone Encounter (Signed)
lmtcb X1 for pt  

## 2015-11-09 NOTE — Telephone Encounter (Signed)
Spoke with pt, aware of recs.  rx sent to preferred pharmacy.  Nothing further needed.  

## 2015-11-09 NOTE — Telephone Encounter (Signed)
Prednisone: Take 40mg po daily for 3 days, then take 30mg po daily for 3 days, then take 20mg po daily for two days, then take 10mg po daily for 2 days  

## 2015-11-11 ENCOUNTER — Encounter (HOSPITAL_COMMUNITY)
Admission: RE | Admit: 2015-11-11 | Discharge: 2015-11-11 | Disposition: A | Discharge status: Home / Self Care | Payer: Self-pay | Source: Ambulatory Visit | Attending: Pulmonary Disease | Admitting: Pulmonary Disease

## 2015-11-14 ENCOUNTER — Encounter (HOSPITAL_COMMUNITY)
Admission: RE | Admit: 2015-11-14 | Discharge: 2015-11-14 | Disposition: A | Discharge status: Home / Self Care | Payer: Self-pay | Source: Ambulatory Visit | Attending: Pulmonary Disease | Admitting: Pulmonary Disease

## 2015-11-16 ENCOUNTER — Encounter (HOSPITAL_COMMUNITY): Payer: Self-pay

## 2015-11-18 ENCOUNTER — Encounter (HOSPITAL_COMMUNITY): Payer: Self-pay

## 2015-11-21 ENCOUNTER — Encounter (HOSPITAL_COMMUNITY)
Admission: RE | Admit: 2015-11-21 | Discharge: 2015-11-21 | Disposition: A | Discharge status: Home / Self Care | Payer: Self-pay | Source: Ambulatory Visit | Attending: Pulmonary Disease | Admitting: Pulmonary Disease

## 2015-11-23 ENCOUNTER — Encounter (HOSPITAL_COMMUNITY)
Admission: RE | Admit: 2015-11-23 | Discharge: 2015-11-23 | Disposition: A | Discharge status: Home / Self Care | Payer: Self-pay | Source: Ambulatory Visit | Attending: Pulmonary Disease | Admitting: Pulmonary Disease

## 2015-11-25 ENCOUNTER — Encounter (HOSPITAL_COMMUNITY)
Admission: RE | Admit: 2015-11-25 | Discharge: 2015-11-25 | Disposition: A | Discharge status: Home / Self Care | Payer: Self-pay | Source: Ambulatory Visit | Attending: Pulmonary Disease | Admitting: Pulmonary Disease

## 2015-11-28 ENCOUNTER — Ambulatory Visit (INDEPENDENT_AMBULATORY_CARE_PROVIDER_SITE_OTHER): Payer: Medicare Other | Admitting: Pulmonary Disease

## 2015-11-28 ENCOUNTER — Encounter: Payer: Self-pay | Admitting: Pulmonary Disease

## 2015-11-28 ENCOUNTER — Telehealth (HOSPITAL_COMMUNITY): Payer: Self-pay | Admitting: *Deleted

## 2015-11-28 ENCOUNTER — Other Ambulatory Visit (INDEPENDENT_AMBULATORY_CARE_PROVIDER_SITE_OTHER): Payer: Medicare Other

## 2015-11-28 VITALS — BP 142/80 | HR 98 | Ht 68.0 in | Wt 193.8 lb

## 2015-11-28 DIAGNOSIS — Z23 Encounter for immunization: Secondary | ICD-10-CM | POA: Diagnosis not present

## 2015-11-28 DIAGNOSIS — Z5181 Encounter for therapeutic drug level monitoring: Secondary | ICD-10-CM

## 2015-11-28 DIAGNOSIS — J42 Unspecified chronic bronchitis: Secondary | ICD-10-CM | POA: Diagnosis not present

## 2015-11-28 DIAGNOSIS — J9611 Chronic respiratory failure with hypoxia: Secondary | ICD-10-CM | POA: Diagnosis not present

## 2015-11-28 LAB — HEPATIC FUNCTION PANEL
ALK PHOS: 71 U/L (ref 39–117)
ALT: 8 U/L (ref 0–53)
AST: 12 U/L (ref 0–37)
Albumin: 4.2 g/dL (ref 3.5–5.2)
BILIRUBIN DIRECT: 0.1 mg/dL (ref 0.0–0.3)
BILIRUBIN TOTAL: 0.5 mg/dL (ref 0.2–1.2)
Total Protein: 6.7 g/dL (ref 6.0–8.3)

## 2015-11-28 MED ORDER — ALBUTEROL SULFATE HFA 108 (90 BASE) MCG/ACT IN AERS: 1.0000 | INHALATION_SPRAY | Freq: Four times a day (QID) | RESPIRATORY_TRACT | 3 refills | Status: DC | PRN

## 2015-11-28 NOTE — Patient Instructions (Signed)
We will call you with the results of your bloodwork Keep taking her medicines as you're doing Keep using her oxygen as you're doing Keep participating in pulmonary rehabilitation Remember that this is the time of year when there are lots of viruses around. Wash your hands frequently when you're out in public. We will see you back in 3-4 months or sooner if needed

## 2015-11-28 NOTE — Assessment & Plan Note (Signed)
Continue using oxygen 2 L at rest, 3 with exertion

## 2015-11-28 NOTE — Assessment & Plan Note (Signed)
He has very severe COPD with recurrent exacerbations but fortunately this is been a better interval for him since we started him on daily azithromycin in March 2017. He remains compliant with Stiolto and the azithromycin. He is participating in pulmonary rehabilitation on a regular basis. He reports no side effects (hearing loss, palpitations) on azithromycin.  Plan: Continue Stiolto Continue oxygen as prescribed Continue daily azithromycin, we will readdress the need for this in March 2018 Check liver function testing today to check for toxicity from azithromycin EKG next visit in 3 months Flu shot today Follow-up 3 months

## 2015-11-28 NOTE — Progress Notes (Signed)
Subjective:    Patient ID: Gilbert Jefferson, male    DOB: 1950-03-30, 65 y.o.   MRN: [id number]  Synopsis: Former patient of Dr. Joya Jefferson with COPD Copd gold D  01/18/2012 ONO RA normal Gilbert Jefferson 10/08/12: FeV1 29% FeV1/FVC 41% Alpha one antitrypsin 04/10/2013>>normal 145 Spiro 04/10/2013>> FeV1 26% FVC 56% FeV1/FVC 53% patient COPD   HPI Chief Complaint  Patient presents with  . Follow-up    11mo rov. pt states breathing is baseline. pt c/o sob with exertion, non prod cough at times prob with yellow mucus mainly in the morning & occ wheezing.   Gilbert Jefferson had a mild flare up of his dyspnea after that visit after he had been riding his lawnmower and cutting the grass.  He had more mucus in his chest and dyspnea.  He was wearing a mask but something really caused his COPD to flare up.  He had no energy.  The prednisone we called in helped.  He continues to participate in pulmonary rehab at [facility] three days a week, he is about 1/2 way through the system.  His fees there aren't too bad.    He continues to take the azithromycin daily.  He denies hearing difficulty.    His oxygen is on 2 at rest then 3 LPM.    He needs a flu shot.  Past Medical History:  Diagnosis Date  . COPD (chronic obstructive pulmonary disease) ([place])   . Hypertension   . Osteoarthrosis   . Varicose veins       Review of Systems  Constitutional: Positive for fatigue. Negative for chills and fever.  HENT: Negative for rhinorrhea and sinus pressure.   Respiratory: Positive for cough and shortness of breath. Negative for wheezing.   Cardiovascular: Negative for chest pain, palpitations and leg swelling.       Objective:   Physical Exam Vitals:   11/28/15 1351  BP: (!) 142/80  Pulse: 98  SpO2: 93%  Weight: 193 lb 12.8 oz (87.9 kg)  Height: 5\' 8"  (1.727 m)   RA  Gen: chronically ill appearing HENT: OP clear, TM's clear, neck supple PULM: poor air movement today, no wheezing, normal percussion CV: RRR, no mgr,  trace edema GI: BS+, soft, non tender Derm: no cyanosis or rash Psyche: normal mood and affect  No liver function testing since April  EKG July 2017 images reviewed showing no evidence of conduction delay  CBC    Component Value Date/Time   WBC 7.0 03/24/2015 1624   RBC 4.64 03/24/2015 1624   HGB 13.8 03/24/2015 1624   HCT 40.9 03/24/2015 1624   PLT 254.0 03/24/2015 1624   MCV 88.1 03/24/2015 1624   MCH 31.6 04/28/2010 1621   MCHC 33.8 03/24/2015 1624   RDW 12.7 03/24/2015 1624   LYMPHSABS 1.1 03/24/2015 1624   MONOABS 1.2 (H) 03/24/2015 1624   EOSABS 0.1 03/24/2015 1624   BASOSABS 0.0 03/24/2015 1624    BMET    Component Value Date/Time   NA 140 04/28/2010 1644   K 5.2 (H) 04/28/2010 1644   CL 105 04/28/2010 1644   GLUCOSE 80 04/28/2010 1644   BUN 17 04/28/2010 1644   CREATININE 1.0 04/28/2010 1644          Assessment & Plan:  No problem-specific Assessment & Plan notes found for this encounter.    Current Outpatient Prescriptions:  .  albuterol (PROAIR HFA) 108 (90 BASE) MCG/ACT inhaler, Inhale 1-2 puffs into the lungs every 6 (six) hours as needed  for shortness of breath., Disp: 3 Inhaler, Rfl: 3 .  azithromycin (ZITHROMAX) 250 MG tablet, Take 1 tablet (250 mg total) by mouth daily., Disp: 90 tablet, Rfl: 1 .  busPIRone (BUSPAR) 15 MG tablet, Take 7.5 mg by mouth 2 (two) times daily., Disp: , Rfl:  .  STIOLTO RESPIMAT 2.5-2.5 MCG/ACT AERS, USE 2 INHALATIONS DAILY, Disp: 12 g, Rfl: 3

## 2015-11-28 NOTE — Addendum Note (Signed)
Addended by: Len Blalock on: 11/28/2015 02:24 PM   Modules accepted: Orders

## 2015-11-30 ENCOUNTER — Encounter (HOSPITAL_COMMUNITY)
Admission: RE | Admit: 2015-11-30 | Discharge: 2015-11-30 | Disposition: A | Discharge status: Home / Self Care | Payer: Self-pay | Source: Ambulatory Visit | Attending: Pulmonary Disease | Admitting: Pulmonary Disease

## 2015-11-30 DIAGNOSIS — J432 Centrilobular emphysema: Secondary | ICD-10-CM | POA: Insufficient documentation

## 2015-12-02 ENCOUNTER — Encounter (HOSPITAL_COMMUNITY): Payer: Self-pay

## 2015-12-05 ENCOUNTER — Encounter (HOSPITAL_COMMUNITY)
Admission: RE | Admit: 2015-12-05 | Discharge: 2015-12-05 | Disposition: A | Discharge status: Home / Self Care | Payer: Self-pay | Source: Ambulatory Visit | Attending: Pulmonary Disease | Admitting: Pulmonary Disease

## 2015-12-07 ENCOUNTER — Encounter (HOSPITAL_COMMUNITY)
Admission: RE | Admit: 2015-12-07 | Discharge: 2015-12-07 | Disposition: A | Discharge status: Home / Self Care | Payer: Medicare Other | Source: Ambulatory Visit | Attending: Pulmonary Disease | Admitting: Pulmonary Disease

## 2015-12-09 ENCOUNTER — Encounter (HOSPITAL_COMMUNITY): Payer: Self-pay

## 2015-12-12 ENCOUNTER — Encounter (HOSPITAL_COMMUNITY)
Admission: RE | Admit: 2015-12-12 | Discharge: 2015-12-12 | Disposition: A | Discharge status: Home / Self Care | Payer: Self-pay | Source: Ambulatory Visit | Attending: Pulmonary Disease | Admitting: Pulmonary Disease

## 2015-12-14 ENCOUNTER — Encounter (HOSPITAL_COMMUNITY)
Admission: RE | Admit: 2015-12-14 | Discharge: 2015-12-14 | Disposition: A | Discharge status: Home / Self Care | Payer: Self-pay | Source: Ambulatory Visit | Attending: Pulmonary Disease | Admitting: Pulmonary Disease

## 2015-12-16 ENCOUNTER — Encounter (HOSPITAL_COMMUNITY)
Admission: RE | Admit: 2015-12-16 | Discharge: 2015-12-16 | Disposition: A | Discharge status: Home / Self Care | Payer: Self-pay | Source: Ambulatory Visit | Attending: Pulmonary Disease | Admitting: Pulmonary Disease

## 2015-12-19 ENCOUNTER — Encounter (HOSPITAL_COMMUNITY): Payer: Self-pay

## 2015-12-21 ENCOUNTER — Encounter (HOSPITAL_COMMUNITY)
Admission: RE | Admit: 2015-12-21 | Discharge: 2015-12-21 | Disposition: A | Discharge status: Home / Self Care | Payer: Self-pay | Source: Ambulatory Visit | Attending: Pulmonary Disease | Admitting: Pulmonary Disease

## 2015-12-23 ENCOUNTER — Encounter (HOSPITAL_COMMUNITY): Payer: Self-pay

## 2015-12-26 ENCOUNTER — Encounter (HOSPITAL_COMMUNITY)
Admission: RE | Admit: 2015-12-26 | Discharge: 2015-12-26 | Disposition: A | Discharge status: Home / Self Care | Payer: Self-pay | Source: Ambulatory Visit | Attending: Pulmonary Disease | Admitting: Pulmonary Disease

## 2015-12-28 ENCOUNTER — Telehealth (HOSPITAL_COMMUNITY): Payer: Self-pay | Admitting: *Deleted

## 2015-12-28 ENCOUNTER — Encounter (HOSPITAL_COMMUNITY): Admission: RE | Admit: 2015-12-28 | Payer: Self-pay | Source: Ambulatory Visit

## 2015-12-30 ENCOUNTER — Encounter (HOSPITAL_COMMUNITY): Payer: Self-pay

## 2016-01-02 ENCOUNTER — Encounter (HOSPITAL_COMMUNITY)
Admission: RE | Admit: 2016-01-02 | Discharge: 2016-01-02 | Disposition: A | Discharge status: Home / Self Care | Payer: Self-pay | Source: Ambulatory Visit | Attending: Pulmonary Disease | Admitting: Pulmonary Disease

## 2016-01-02 DIAGNOSIS — J432 Centrilobular emphysema: Secondary | ICD-10-CM | POA: Insufficient documentation

## 2016-01-04 ENCOUNTER — Encounter (HOSPITAL_COMMUNITY)
Admission: RE | Admit: 2016-01-04 | Discharge: 2016-01-04 | Disposition: A | Discharge status: Home / Self Care | Payer: Self-pay | Source: Ambulatory Visit | Attending: Pulmonary Disease | Admitting: Pulmonary Disease

## 2016-01-06 ENCOUNTER — Encounter (HOSPITAL_COMMUNITY)
Admission: RE | Admit: 2016-01-06 | Discharge: 2016-01-06 | Disposition: A | Discharge status: Home / Self Care | Payer: Self-pay | Source: Ambulatory Visit | Attending: Pulmonary Disease | Admitting: Pulmonary Disease

## 2016-01-09 ENCOUNTER — Encounter (HOSPITAL_COMMUNITY)
Admission: RE | Admit: 2016-01-09 | Discharge: 2016-01-09 | Disposition: A | Discharge status: Home / Self Care | Payer: Self-pay | Source: Ambulatory Visit | Attending: Pulmonary Disease | Admitting: Pulmonary Disease

## 2016-01-11 ENCOUNTER — Encounter (HOSPITAL_COMMUNITY)
Admission: RE | Admit: 2016-01-11 | Discharge: 2016-01-11 | Disposition: A | Discharge status: Home / Self Care | Payer: Self-pay | Source: Ambulatory Visit | Attending: Pulmonary Disease | Admitting: Pulmonary Disease

## 2016-01-13 ENCOUNTER — Encounter (HOSPITAL_COMMUNITY): Admission: RE | Admit: 2016-01-13 | Payer: Self-pay | Source: Ambulatory Visit

## 2016-01-16 ENCOUNTER — Encounter (HOSPITAL_COMMUNITY)
Admission: RE | Admit: 2016-01-16 | Discharge: 2016-01-16 | Disposition: A | Discharge status: Home / Self Care | Payer: Self-pay | Source: Ambulatory Visit | Attending: Pulmonary Disease | Admitting: Pulmonary Disease

## 2016-01-16 ENCOUNTER — Telehealth: Payer: Self-pay | Admitting: Pulmonary Disease

## 2016-01-16 MED ORDER — AZITHROMYCIN 250 MG PO TABS: 250.0000 mg | ORAL_TABLET | Freq: Every day | ORAL | 1 refills | Status: DC

## 2016-01-16 NOTE — Telephone Encounter (Signed)
Called and spoke to pt. Pt is requesting a refill of Zithromax 250mg . Rx sent to preferred pharmacy. Pt verbalized understanding and denied any further questions or concerns at this time.

## 2016-01-18 ENCOUNTER — Encounter (HOSPITAL_COMMUNITY)
Admission: RE | Admit: 2016-01-18 | Discharge: 2016-01-18 | Disposition: A | Discharge status: Home / Self Care | Payer: Self-pay | Source: Ambulatory Visit | Attending: Pulmonary Disease | Admitting: Pulmonary Disease

## 2016-01-20 ENCOUNTER — Encounter (HOSPITAL_COMMUNITY)
Admission: RE | Admit: 2016-01-20 | Discharge: 2016-01-20 | Disposition: A | Discharge status: Home / Self Care | Payer: Self-pay | Source: Ambulatory Visit | Attending: Pulmonary Disease | Admitting: Pulmonary Disease

## 2016-01-23 ENCOUNTER — Encounter (HOSPITAL_COMMUNITY): Payer: Self-pay

## 2016-01-25 ENCOUNTER — Encounter (HOSPITAL_COMMUNITY)
Admission: RE | Admit: 2016-01-25 | Discharge: 2016-01-25 | Disposition: A | Discharge status: Home / Self Care | Payer: Self-pay | Source: Ambulatory Visit | Attending: Pulmonary Disease | Admitting: Pulmonary Disease

## 2016-01-27 ENCOUNTER — Encounter (HOSPITAL_COMMUNITY)
Admission: RE | Admit: 2016-01-27 | Discharge: 2016-01-27 | Disposition: A | Discharge status: Home / Self Care | Payer: Self-pay | Source: Ambulatory Visit | Attending: Pulmonary Disease | Admitting: Pulmonary Disease

## 2016-01-30 ENCOUNTER — Encounter (HOSPITAL_COMMUNITY): Payer: Self-pay

## 2016-02-01 ENCOUNTER — Encounter (HOSPITAL_COMMUNITY)
Admission: RE | Admit: 2016-02-01 | Discharge: 2016-02-01 | Disposition: A | Discharge status: Home / Self Care | Payer: Self-pay | Source: Ambulatory Visit | Attending: Pulmonary Disease | Admitting: Pulmonary Disease

## 2016-02-01 DIAGNOSIS — J432 Centrilobular emphysema: Secondary | ICD-10-CM | POA: Insufficient documentation

## 2016-02-03 ENCOUNTER — Encounter (HOSPITAL_COMMUNITY)
Admission: RE | Admit: 2016-02-03 | Discharge: 2016-02-03 | Disposition: A | Discharge status: Home / Self Care | Payer: Self-pay | Source: Ambulatory Visit | Attending: Pulmonary Disease | Admitting: Pulmonary Disease

## 2016-02-06 ENCOUNTER — Encounter (HOSPITAL_COMMUNITY): Payer: Self-pay

## 2016-02-08 ENCOUNTER — Encounter (HOSPITAL_COMMUNITY)
Admission: RE | Admit: 2016-02-08 | Discharge: 2016-02-08 | Disposition: A | Discharge status: Home / Self Care | Payer: Self-pay | Source: Ambulatory Visit | Attending: Pulmonary Disease | Admitting: Pulmonary Disease

## 2016-02-10 ENCOUNTER — Encounter (HOSPITAL_COMMUNITY)
Admission: RE | Admit: 2016-02-10 | Discharge: 2016-02-10 | Disposition: A | Discharge status: Home / Self Care | Payer: Self-pay | Source: Ambulatory Visit | Attending: Pulmonary Disease | Admitting: Pulmonary Disease

## 2016-02-13 ENCOUNTER — Encounter (HOSPITAL_COMMUNITY)
Admission: RE | Admit: 2016-02-13 | Discharge: 2016-02-13 | Disposition: A | Discharge status: Home / Self Care | Payer: Self-pay | Source: Ambulatory Visit | Attending: Pulmonary Disease | Admitting: Pulmonary Disease

## 2016-02-15 ENCOUNTER — Encounter (HOSPITAL_COMMUNITY): Payer: Self-pay

## 2016-02-17 ENCOUNTER — Encounter (HOSPITAL_COMMUNITY): Payer: Self-pay

## 2016-02-20 ENCOUNTER — Encounter (HOSPITAL_COMMUNITY): Payer: Self-pay

## 2016-02-22 ENCOUNTER — Encounter (HOSPITAL_COMMUNITY)
Admission: RE | Admit: 2016-02-22 | Discharge: 2016-02-22 | Disposition: A | Discharge status: Home / Self Care | Payer: Self-pay | Source: Ambulatory Visit | Attending: Pulmonary Disease | Admitting: Pulmonary Disease

## 2016-02-24 ENCOUNTER — Encounter (HOSPITAL_COMMUNITY)
Admission: RE | Admit: 2016-02-24 | Discharge: 2016-02-24 | Disposition: A | Discharge status: Home / Self Care | Payer: Self-pay | Source: Ambulatory Visit | Attending: Pulmonary Disease | Admitting: Pulmonary Disease

## 2016-02-27 ENCOUNTER — Encounter (HOSPITAL_COMMUNITY)
Admission: RE | Admit: 2016-02-27 | Discharge: 2016-02-27 | Disposition: A | Discharge status: Home / Self Care | Payer: Self-pay | Source: Ambulatory Visit | Attending: Pulmonary Disease | Admitting: Pulmonary Disease

## 2016-02-29 ENCOUNTER — Encounter (HOSPITAL_COMMUNITY)
Admission: RE | Admit: 2016-02-29 | Discharge: 2016-02-29 | Disposition: A | Discharge status: Home / Self Care | Payer: Self-pay | Source: Ambulatory Visit | Attending: Pulmonary Disease | Admitting: Pulmonary Disease

## 2016-03-02 ENCOUNTER — Encounter (HOSPITAL_COMMUNITY)
Admission: RE | Admit: 2016-03-02 | Discharge: 2016-03-02 | Disposition: A | Discharge status: Home / Self Care | Payer: Self-pay | Source: Ambulatory Visit | Attending: Pulmonary Disease | Admitting: Pulmonary Disease

## 2016-03-02 DIAGNOSIS — J432 Centrilobular emphysema: Secondary | ICD-10-CM | POA: Insufficient documentation

## 2016-03-05 ENCOUNTER — Encounter (HOSPITAL_COMMUNITY)
Admission: RE | Admit: 2016-03-05 | Discharge: 2016-03-05 | Disposition: A | Discharge status: Home / Self Care | Payer: Self-pay | Source: Ambulatory Visit | Attending: Pulmonary Disease | Admitting: Pulmonary Disease

## 2016-03-12 ENCOUNTER — Encounter (HOSPITAL_COMMUNITY)
Admission: RE | Admit: 2016-03-12 | Discharge: 2016-03-12 | Disposition: A | Discharge status: Home / Self Care | Payer: Self-pay | Source: Ambulatory Visit | Attending: Pulmonary Disease | Admitting: Pulmonary Disease

## 2016-03-15 ENCOUNTER — Telehealth: Payer: Self-pay | Admitting: Pulmonary Disease

## 2016-03-15 MED ORDER — PREDNISONE 20 MG PO TABS: 20.0000 mg | ORAL_TABLET | Freq: Every day | ORAL | 0 refills | Status: DC

## 2016-03-15 NOTE — Telephone Encounter (Signed)
Offer prednisone through the weekend, then discuss with Dr Lake Bells next week-    Continue azithromycin       Prednisone 20 mg, # 5, 1 daily  Also ok to take an otc symptom controller like Tylenol Cold And Flu  Or Mucinex DM if those help

## 2016-03-15 NOTE — Telephone Encounter (Signed)
Called and spoke with pt and he is aware of CY recs.  pred has been sent to the pharmacy and nothing further is needed.

## 2016-03-15 NOTE — Telephone Encounter (Signed)
CY  Please Advise-  This is BQ pt. He called in today states he has been coughing with yellow mucus coming, increase sob, rattling in his chest, has some wheezing, some chest tightness Denies fever. Pt. States he already takes azithromycin every day and wants to know if something else could be called in that will help him.  No Known Allergies

## 2016-03-16 ENCOUNTER — Encounter (HOSPITAL_COMMUNITY)
Admission: RE | Admit: 2016-03-16 | Discharge: 2016-03-16 | Disposition: A | Discharge status: Home / Self Care | Payer: Self-pay | Source: Ambulatory Visit | Attending: Pulmonary Disease | Admitting: Pulmonary Disease

## 2016-03-19 ENCOUNTER — Telehealth: Payer: Self-pay | Admitting: Pulmonary Disease

## 2016-03-19 ENCOUNTER — Encounter: Payer: Self-pay | Admitting: Internal Medicine

## 2016-03-19 ENCOUNTER — Ambulatory Visit (INDEPENDENT_AMBULATORY_CARE_PROVIDER_SITE_OTHER): Payer: Medicare Other | Admitting: Internal Medicine

## 2016-03-19 ENCOUNTER — Ambulatory Visit (INDEPENDENT_AMBULATORY_CARE_PROVIDER_SITE_OTHER)
Admission: RE | Admit: 2016-03-19 | Discharge: 2016-03-19 | Disposition: A | Discharge status: Home / Self Care | Payer: Medicare Other | Source: Ambulatory Visit | Attending: Internal Medicine | Admitting: Internal Medicine

## 2016-03-19 VITALS — BP 140/70 | HR 90 | Temp 97.4°F | Ht 68.0 in | Wt 204.0 lb

## 2016-03-19 DIAGNOSIS — J9611 Chronic respiratory failure with hypoxia: Secondary | ICD-10-CM

## 2016-03-19 DIAGNOSIS — J42 Unspecified chronic bronchitis: Secondary | ICD-10-CM

## 2016-03-19 MED ORDER — PREDNISONE 10 MG PO TABS
ORAL_TABLET | ORAL | 0 refills | Status: DC
Start: 2016-03-19 — End: 2016-04-05

## 2016-03-19 MED ORDER — FLUTTER DEVI: 1.0000 | 0 refills | Status: DC | PRN

## 2016-03-19 MED ORDER — AMOXICILLIN-POT CLAVULANATE 875-125 MG PO TABS: 1.0000 | ORAL_TABLET | Freq: Two times a day (BID) | ORAL | 0 refills | Status: AC

## 2016-03-19 NOTE — Telephone Encounter (Signed)
Spoke with pt. States that he is not improved from last week. Reports cough and wheezing. Cough is producing thick, green mucus. Denies chest tightness, SOB, fever/chills/sweats. Was given prednisone by CY last week, he has not noticed much improvement. Pt would like recommendations.  MW- please advise as BQ worked 11pm Elink last night. Thanks.

## 2016-03-19 NOTE — Telephone Encounter (Signed)
Spoke with pt. He is aware of MW's response. Pt has been scheduled with MW today at 2:00pm. Nothing further was needed.

## 2016-03-19 NOTE — Progress Notes (Signed)
Subjective:    Patient ID: Gilbert Jefferson, male    DOB: 1950-08-12, 66 y.o.   MRN: [id number]  Synopsis: Former patient of Dr. Joya Jefferson with COPD Copd gold D  01/18/2012 ONO RA normal Gilbert Jefferson 10/08/12: FeV1 29% FeV1/FVC 41% Alpha one antitrypsin 04/10/2013>>normal 145 Spiro 04/10/2013>> FeV1 26% FVC 56% FeV1/FVC 53% patient COPD    03/19/2016 acute extended ov/Gilbert Jefferson re: GOLD IV/ 02 dep 2lpm  24/7 and 3lpm with ex and maint stiolto an zmax Chief Complaint  Patient presents with  . Acute Visit    Pt c/o increased cough and min increased SOB x 2 wks. He started coughing up green sputum 3 days ago. He also c/o wheezing that just started today.  cough worse x 2weeks esp since 03/15/16 rx pred and some better breathing but then  worse cough 24/7 since 2/15 and more sob on day of ov  Baseline saba increased x one month to about  X 3 in 24 h- comfortable at rest p saba hfa though the one he uses is clearly clogged up.  No obvious day to day or daytime variability or assoc   mucus plugs or hemoptysis or cp or chest tightness,   or overt sinus or hb symptoms. No unusual exp hx or h/o childhood pna/ asthma or knowledge of premature birth.    Also denies any obvious fluctuation of symptoms with weather or environmental changes or other aggravating or alleviating factors except as outlined above   Current Medications, Allergies, Complete Past Medical History, Past Surgical History, Family History, and Social History were reviewed in [employer] record.  ROS  The following are not active complaints unless bolded sore throat, dysphagia, dental problems, itching, sneezing,  nasal congestion or excess/ purulent secretions, ear ache,   fever, chills, sweats, unintended wt loss, classically pleuritic or exertional cp,  orthopnea pnd or leg swelling, presyncope, palpitations, abdominal pain, anorexia, nausea, vomiting, diarrhea  or change in bowel or bladder habits, change in stools or urine,  dysuria,hematuria,  rash, arthralgias, visual complaints, headache, numbness, weakness or ataxia or problems with walking or coordination,  change in mood/affect or memory.              Objective:   Physical Exam     amb mod obese wm hacking cough   Wt Readings from Last 3 Encounters:  03/19/16 204 lb (92.5 kg)  11/28/15 193 lb 12.8 oz (87.9 kg)  09/14/15 186 lb (84.4 kg)    Vital signs reviewed - Note on arrival 02 sats  100% on 2lpm      HEENT: nl dentition, turbinates bilaterally, and oropharynx. Nl external ear canals without cough reflex   NECK :  without JVD/Nodes/TM/ nl carotid upstrokes bilaterally   LUNGS: no acc muscle use,  slt barrel chest/ pan exp wheeze bilaterally    CV:  RRR  no s3 or murmur or increase in P2, and no edema   ABD:  Obese/  nontender with nl inspiratory excursion in the supine position. No bruits or organomegaly appreciated, bowel sounds nl  MS:  Nl gait/ ext warm without deformities, calf tenderness, cyanosis or clubbing No obvious joint restrictions   SKIN: warm and dry without lesions    NEURO:  alert, approp, nl sensorium with  no motor or cerebellar deficits apparent.         CXR PA and Lateral:   03/19/2016 :    I personally reviewed images and agree with radiology impression as follows:  No active cardiopulmonary disease.       Assessment & Plan:

## 2016-03-19 NOTE — Patient Instructions (Addendum)
Prednisone 10 mg take  4 each am x 2 days,   2 each am x 2 days,  1 each am x 2 days and stop   Augmentin 875 mg take one pill twice daily  X 10 days - take at breakfast and supper with large glass of water.  It would help reduce the usual side effects (diarrhea and yeast infections) if you ate cultured yogurt at lunch.   For cough > mucinex dm 1200 mg every 12 hours and use the flutter valve as much you can   Only use your albuterol as a rescue medication to be used if you can't catch your breath by resting or doing a relaxed purse lip breathing pattern.  - The less you use it, the better it will work when you need it. - Ok to use up to 2 puffs  every 4 hours if you must but call for immediate appointment if use goes up over your usual need - Don't leave home without it !!  (think of it like the spare tire for your car)   Try prilosec otc 20mg   Take 30-60 min before first meal of the day and Pepcid ac (famotidine) 20 mg one @  bedtime until cough is completely gone for at least a week without the need for cough suppression    GERD (REFLUX)  is an extremely common cause of respiratory symptoms just like yours , many times with no obvious heartburn at all.    It can be treated with medication, but also with lifestyle changes including elevation of the head of your bed (ideally with 6 inch  bed blocks),  Smoking cessation, avoidance of late meals, excessive alcohol, and avoid fatty foods, chocolate, peppermint, colas, red wine, and acidic juices such as orange juice.  NO MINT OR MENTHOL PRODUCTS SO NO COUGH DROPS   USE SUGARLESS CANDY INSTEAD (Jolley ranchers or Stover's or Life Savers) or even ice chips will also do - the key is to swallow to prevent all throat clearing. NO OIL BASED VITAMINS - use powdered substitutes.  Please remember to go to the x-ray department downstairs in the basement  for your tests - we will call you with the results when they are available.

## 2016-03-19 NOTE — Telephone Encounter (Signed)
Pt returning call.Gilbert Jefferson ° °

## 2016-03-19 NOTE — Telephone Encounter (Signed)
No more phone care attempts for this problem, needs to be seen

## 2016-03-19 NOTE — Telephone Encounter (Signed)
lmtcb x1 for pt. 

## 2016-03-20 ENCOUNTER — Encounter: Payer: Self-pay | Admitting: Internal Medicine

## 2016-03-20 HISTORY — DX: Morbid (severe) obesity due to excess calories: E66.01

## 2016-03-20 NOTE — Progress Notes (Signed)
Spoke with pt and notified of results per Dr. Wert. Pt verbalized understanding and denied any questions. 

## 2016-03-20 NOTE — Assessment & Plan Note (Signed)
Adequate control on present rx, reviewed in detail with pt > no change in rx needed  = 2lpm but 3lpm with exertion

## 2016-03-20 NOTE — Assessment & Plan Note (Addendum)
Copd gold D  01/18/2012 ONO RA normal Arlyce Harman 10/08/12: FeV1 29% FeV1/FVC 41% Alpha one antitrypsin 04/10/2013>>normal 145 Spiro 04/10/2013>> FeV1 26% FVC 56% FeV1/FVC 53% - 03/19/2016  After extensive coaching HFA effectiveness =    90% though MDI clogged at baseline  Instructed on how to keep the mdi clean if not using regularly, which is the goal for hfa rescue   Clearly having flare in setting of purulent tracheobroncitis/ poorly controlled AB and meay need to be placed back on ICS  (? Symbicort/spiriva SMI)   For now rec augmentin x 10 days pred x 6  Max rx for gerd short term as his body habitus certainly puts him at risk, esp with violent coughing F/u Dr Lake Bells as planned  I had an extended discussion with the patient reviewing all relevant studies completed to date and  lasting 15 to 20 minutes of a 25 minute acute visit    Each maintenance medication was reviewed in detail including most importantly the difference between maintenance and prns and under what circumstances the prns are to be triggered using an action plan format that is not reflected in the computer generated alphabetically organized AVS.    Please see AVS for specific instructions unique to this visit that I personally wrote and verbalized to the the pt in detail and then reviewed with pt  by my nurse highlighting any  changes in therapy recommended at today's visit to their plan of care.

## 2016-03-20 NOTE — Assessment & Plan Note (Signed)
Body mass index is 31.02   trending up on prednisone ? Needs ICS instead ?  No results found for: TSH   Contributing to gerd risk/ doe/reviewed the need and the process to achieve and maintain neg calorie balance > defer f/u primary care including intermittently monitoring thyroid status

## 2016-03-26 ENCOUNTER — Encounter (HOSPITAL_COMMUNITY)
Admission: RE | Admit: 2016-03-26 | Discharge: 2016-03-26 | Disposition: A | Discharge status: Home / Self Care | Payer: Self-pay | Source: Ambulatory Visit | Attending: Pulmonary Disease | Admitting: Pulmonary Disease

## 2016-03-28 ENCOUNTER — Encounter (HOSPITAL_COMMUNITY)
Admission: RE | Admit: 2016-03-28 | Discharge: 2016-03-28 | Disposition: A | Discharge status: Home / Self Care | Payer: Self-pay | Source: Ambulatory Visit | Attending: Pulmonary Disease | Admitting: Pulmonary Disease

## 2016-03-28 ENCOUNTER — Ambulatory Visit: Payer: Medicare Other | Admitting: Pulmonary Disease

## 2016-03-30 ENCOUNTER — Encounter (HOSPITAL_COMMUNITY)
Admission: RE | Admit: 2016-03-30 | Discharge: 2016-03-30 | Disposition: A | Discharge status: Home / Self Care | Payer: Medicare Other | Source: Ambulatory Visit | Attending: Pulmonary Disease | Admitting: Pulmonary Disease

## 2016-03-30 DIAGNOSIS — J432 Centrilobular emphysema: Secondary | ICD-10-CM | POA: Diagnosis not present

## 2016-04-02 ENCOUNTER — Encounter (HOSPITAL_COMMUNITY)
Admission: RE | Admit: 2016-04-02 | Discharge: 2016-04-02 | Disposition: A | Discharge status: Home / Self Care | Payer: Medicare Other | Source: Ambulatory Visit | Attending: Pulmonary Disease | Admitting: Pulmonary Disease

## 2016-04-05 ENCOUNTER — Encounter: Payer: Self-pay | Admitting: Pulmonary Disease

## 2016-04-05 ENCOUNTER — Ambulatory Visit (INDEPENDENT_AMBULATORY_CARE_PROVIDER_SITE_OTHER): Payer: Medicare Other | Admitting: Pulmonary Disease

## 2016-04-05 VITALS — BP 148/82 | HR 100 | Temp 97.5°F | Ht 68.0 in | Wt 205.0 lb

## 2016-04-05 DIAGNOSIS — J42 Unspecified chronic bronchitis: Secondary | ICD-10-CM | POA: Diagnosis not present

## 2016-04-05 DIAGNOSIS — J9611 Chronic respiratory failure with hypoxia: Secondary | ICD-10-CM

## 2016-04-05 MED ORDER — PREDNISONE 20 MG PO TABS: 20.0000 mg | ORAL_TABLET | Freq: Every day | ORAL | 0 refills | Status: DC

## 2016-04-05 MED ORDER — LEVOFLOXACIN 500 MG PO TABS: 500.0000 mg | ORAL_TABLET | Freq: Every day | ORAL | 0 refills | Status: DC

## 2016-04-05 MED ORDER — FLUTICASONE-UMECLIDIN-VILANT 100-62.5-25 MCG/INH IN AEPB: 1.0000 | INHALATION_SPRAY | Freq: Every day | RESPIRATORY_TRACT | 0 refills | Status: DC

## 2016-04-05 NOTE — Progress Notes (Signed)
Subjective:    Patient ID: Gilbert Jefferson, male    DOB: 12/27/1950, 66 y.o.   MRN: [id number]  Synopsis: Former patient of Dr. Joya Jefferson with COPD Copd gold D  01/18/2012 ONO RA normal Gilbert Jefferson 10/08/12: FeV1 29% FeV1/FVC 41% Alpha one antitrypsin 04/10/2013>>normal 145 Spiro 04/10/2013>> FeV1 26% FVC 56% FeV1/FVC 53% patient COPD   HPI Chief Complaint  Patient presents with  . Follow-up    pt c/o prod cough with green mucus X3 weeks, sob, fatigue.     Gilbert Jefferson says his chest congestion and dyspnea are coming back in the last few days.  He took th antibiotic we gave him last visit which helped.  However for 2-3 days he has green mucus.   He has had symptoms for about 6 ewek now.   No weight loss, no fevers, no chills.     Past Medical History:  Diagnosis Date  . COPD (chronic obstructive pulmonary disease) ([place])   . Hypertension   . Osteoarthrosis   . Varicose veins       Review of Systems  Constitutional: Positive for fatigue. Negative for chills and fever.  HENT: Negative for rhinorrhea and sinus pressure.   Respiratory: Positive for cough and shortness of breath. Negative for wheezing.   Cardiovascular: Negative for chest pain, palpitations and leg swelling.       Objective:   Physical Exam Vitals:   04/05/16 1534  BP: (!) 148/82  Pulse: 100  Temp: 97.5 F (36.4 C)  TempSrc: Oral  SpO2: 95%  Weight: 205 lb (93 kg)  Height: 5\' 8"  (1.727 m)   RA  Gen: chronically ill appearing HENT: OP clear, TM's clear, neck supple PULM: wheezing bilaterally B, normal percussion CV: RRR, no mgr, trace edema GI: BS+, soft, nontender Derm: no cyanosis or rash Psyche: normal mood and affect    Records reviewed from his visit with Dr. Melvyn Jefferson in February where he was noted to have a COPD exacerbation and was treated with prednisone.  February 2018 chest x-ray images independently reviewed showing a barrel-shaped chest, emphysema, no infiltrate  CBC    Component Value Date/Time   WBC 7.0 03/24/2015 1624   RBC 4.64 03/24/2015 1624   HGB 13.8 03/24/2015 1624   HCT 40.9 03/24/2015 1624   PLT 254.0 03/24/2015 1624   MCV 88.1 03/24/2015 1624   MCH 31.6 04/28/2010 1621   MCHC 33.8 03/24/2015 1624   RDW 12.7 03/24/2015 1624   LYMPHSABS 1.1 03/24/2015 1624   MONOABS 1.2 (H) 03/24/2015 1624   EOSABS 0.1 03/24/2015 1624   BASOSABS 0.0 03/24/2015 1624    BMET    Component Value Date/Time   NA 140 04/28/2010 1644   K 5.2 (H) 04/28/2010 1644   CL 105 04/28/2010 1644   GLUCOSE 80 04/28/2010 1644   BUN 17 04/28/2010 1644   CREATININE 1.0 04/28/2010 1644          Assessment & Plan:  COPD (chronic obstructive pulmonary disease) Gold D Mr. Hari has gold grade D COPD and unfortunately his symptoms have flared up again recently. He's been having persistent exacerbation symptoms for about 6 weeks now. His chest x-ray did not show evidence of any sort of chronic infection but I do worry about the possibility of underlying MAI or another underlying chronic infection.  It may just be that his symptoms are poorly controlled on Stiolto.  Plan: Levaquin 5 days, hold azithromycin while taking this Prednisone 5 days Try changing to Trelegy 1 puff daily, samples  provided, hold Stiolto while taking this medicine Keep using albuterol as needed Follow-up with a nurse practitioner in 2-3 weeks to see how he is doing. If the new inhaler as effective then we can either try prescribing that 1 or it's constituent parts.    Current Outpatient Prescriptions:  .  albuterol (PROAIR HFA) 108 (90 Base) MCG/ACT inhaler, Inhale 1-2 puffs into the lungs every 6 (six) hours as needed for shortness of breath., Disp: 3 Inhaler, Rfl: 3 .  azithromycin (ZITHROMAX) 250 MG tablet, Take 1 tablet (250 mg total) by mouth daily., Disp: 90 tablet, Rfl: 1 .  busPIRone (BUSPAR) 15 MG tablet, Take 7.5 mg by mouth 2 (two) times daily., Disp: , Rfl:  .  OXYGEN, Oxygen 2.5 lpm with sleep and as needed  during the day, 3lpm with exertion, Disp: , Rfl:  .  Respiratory Therapy Supplies (FLUTTER) DEVI, 1 Device by Does not apply route as needed., Disp: 1 each, Rfl: 0 .  STIOLTO RESPIMAT 2.5-2.5 MCG/ACT AERS, USE 2 INHALATIONS DAILY, Disp: 12 g, Rfl: 3 .  Fluticasone-Umeclidin-Vilant (TRELEGY ELLIPTA) 100-62.5-25 MCG/INH AEPB, Inhale 1 puff into the lungs daily., Disp: 2 each, Rfl: 0 .  levofloxacin (LEVAQUIN) 500 MG tablet, Take 1 tablet (500 mg total) by mouth daily., Disp: 5 tablet, Rfl: 0 .  predniSONE (DELTASONE) 20 MG tablet, Take 1 tablet (20 mg total) by mouth daily with breakfast., Disp: 5 tablet, Rfl: 0

## 2016-04-05 NOTE — Patient Instructions (Signed)
Take the prednisone and Levaquin as prescribed Please provide [place] with a sample of your mucus as soon as possible Keep taking albuterol on an as-needed basis for shortness of breath and wheezing Try taking Trelegy 1 puff daily, no matter how you feel, don't take Stiolto while you are taking this medicine We will see you back in 2-3 weeks with a nurse practitioner to see how you're doing on the new inhaler and then write a prescription appropriately at that point.

## 2016-04-05 NOTE — Assessment & Plan Note (Signed)
Gilbert Jefferson has gold grade D COPD and unfortunately his symptoms have flared up again recently. He's been having persistent exacerbation symptoms for about 6 weeks now. His chest x-ray did not show evidence of any sort of chronic infection but I do worry about the possibility of underlying MAI or another underlying chronic infection.  It may just be that his symptoms are poorly controlled on Stiolto.  Plan: Levaquin 5 days, hold azithromycin while taking this Prednisone 5 days Try changing to Trelegy 1 puff daily, samples provided, hold Stiolto while taking this medicine Keep using albuterol as needed Follow-up with a nurse practitioner in 2-3 weeks to see how he is doing. If the new inhaler as effective then we can either try prescribing that 1 or it's constituent parts.

## 2016-04-06 ENCOUNTER — Encounter (HOSPITAL_COMMUNITY)
Admission: RE | Admit: 2016-04-06 | Discharge: 2016-04-06 | Disposition: A | Discharge status: Home / Self Care | Payer: Medicare Other | Source: Ambulatory Visit | Attending: Pulmonary Disease | Admitting: Pulmonary Disease

## 2016-04-06 ENCOUNTER — Other Ambulatory Visit: Payer: Medicare Other

## 2016-04-06 DIAGNOSIS — J42 Unspecified chronic bronchitis: Secondary | ICD-10-CM

## 2016-04-08 LAB — RESPIRATORY CULTURE OR RESPIRATORY AND SPUTUM CULTURE: ORGANISM ID, BACTERIA: NORMAL

## 2016-04-10 ENCOUNTER — Telehealth: Payer: Self-pay | Admitting: Pulmonary Disease

## 2016-04-10 NOTE — Telephone Encounter (Signed)
Spoke with pt. He is requesting his sputum culture results.  BQ - please advise. Thanks.

## 2016-04-11 NOTE — Telephone Encounter (Signed)
lmtcb x1 for pt. 

## 2016-04-11 NOTE — Telephone Encounter (Signed)
Patient is returning phone call.  °

## 2016-04-11 NOTE — Telephone Encounter (Signed)
So far negative but it will take up to 6-8 weeks to get the complete results

## 2016-04-11 NOTE — Telephone Encounter (Signed)
Spoke with pt. He is aware of results. Nothing further was needed. 

## 2016-04-13 ENCOUNTER — Encounter (HOSPITAL_COMMUNITY)
Admission: RE | Admit: 2016-04-13 | Discharge: 2016-04-13 | Disposition: A | Discharge status: Home / Self Care | Payer: Medicare Other | Source: Ambulatory Visit | Attending: Pulmonary Disease | Admitting: Pulmonary Disease

## 2016-04-16 ENCOUNTER — Encounter (HOSPITAL_COMMUNITY)
Admission: RE | Admit: 2016-04-16 | Discharge: 2016-04-16 | Disposition: A | Discharge status: Home / Self Care | Payer: Medicare Other | Source: Ambulatory Visit | Attending: Pulmonary Disease | Admitting: Pulmonary Disease

## 2016-04-18 ENCOUNTER — Encounter (HOSPITAL_COMMUNITY)
Admission: RE | Admit: 2016-04-18 | Discharge: 2016-04-18 | Disposition: A | Discharge status: Home / Self Care | Payer: Medicare Other | Source: Ambulatory Visit | Attending: Pulmonary Disease | Admitting: Pulmonary Disease

## 2016-04-19 ENCOUNTER — Encounter: Payer: Self-pay | Admitting: Acute Care

## 2016-04-19 ENCOUNTER — Ambulatory Visit (INDEPENDENT_AMBULATORY_CARE_PROVIDER_SITE_OTHER): Payer: Medicare Other | Admitting: Acute Care

## 2016-04-19 VITALS — BP 130/80 | HR 80 | Ht 68.0 in | Wt 205.0 lb

## 2016-04-19 DIAGNOSIS — J9611 Chronic respiratory failure with hypoxia: Secondary | ICD-10-CM

## 2016-04-19 DIAGNOSIS — J42 Unspecified chronic bronchitis: Secondary | ICD-10-CM | POA: Diagnosis not present

## 2016-04-19 MED ORDER — FLUTICASONE-UMECLIDIN-VILANT 100-62.5-25 MCG/INH IN AEPB: 1.0000 | INHALATION_SPRAY | Freq: Every day | RESPIRATORY_TRACT | 0 refills | Status: DC

## 2016-04-19 NOTE — Progress Notes (Signed)
History of Present Illness Gilbert Jefferson is a 66 y.o. male former smoker with COPD Gold stage D. Previous patient of Dr. Asencion Noble, current patient of Dr. Lake Bells  Synopsis: Former patient of Dr. Joya Gaskins with COPD Copd gold D  01/18/2012 ONO RA normal Arlyce Harman 10/08/12: FeV1 29% FeV1/FVC 41% Alpha one antitrypsin 04/10/2013>>normal 145 Spiro 04/10/2013>> FeV1 26% FVC 56% FeV1/FVC 53% patient COPD     04/19/2016  2 week follow up office visit: Patient was seen by Dr. Lake Bells on 04/05/2016. He was seen for COPD flare, symptoms have persisted for approximately 6 weeks prior to visit. Chest x-ray did not show evidence of any chronic infection, but there is concern for underlying MAI in addition to other underlying chronic infection. There was also concern that perhaps his symptoms are poorly controlled on history all to a maintenance inhaler. Plan at conclusion of office visit 04/05/2016 included: Levaquin 5 days, hold azithromycin while taking this Prednisone 5 days Try changing to Trelegy 1 puff daily, samples provided, hold Stiolto while taking this medicine Keep using albuterol as needed Follow-up with a nurse practitioner in 2-3 weeks to see how he is doing. If the new inhaler is effective then we can either try prescribing that 1 or it's constituent parts.  Patient presents today for follow-up. He was compliant and completed his Levaquin therapy in addition to his prednisone taper. He states he does feel much better. No cough or secretions. Initially he felt the Trelegy  inhaler made him more anxious. He states that this is subsequently subsided. I suspect there was a significant amount of anxiety in changing his long-term maintenance inhaler. Additionally he states that he feels that that Trelegy inhaler "runs out each day at about 5:30 PM". I wonder if this is also due to anxiety due to the fact that  he is changing from long-term twice a day maintenance inhaler therapy to once daily  therapy. He has been using the inhaler each day at 5:30 AM. He has agreed to a trial of using the inhaler later in the morning  to see if he feels he gets prolonged relief of his symptoms. He states he does not feel short of breath in the morning and feels that delaying his maintenance medication would be manageable. If this does not resolve the  sensation of his medication running out in the evening, we can try alternate LABA,LAMA,ICS products. Patient is currently using his rescue inhaler approximately 1-2 times daily. This tends to be in the evenings when he feels his maintenance inhaler is less effective. He denies fever, chest pain, orthopnea, or hemoptysis. Ambulatory saturation on room air dropped to 87%. When placed on 2 L saturations rebounded to 93%.  Prior to Levaquin, patient was on daily maintenance of azithromycin for suspected underlying MAI. Of note, patient's wife states she feels like patient's memory was affected by maintenance azithromycin dosing. I will discuss with Dr. Lake Bells Whether or  not he wants patient to continue this daily maintenance azithromycin..  Tests CXR 03/19/2016 FINDINGS: Haziness over the left lung base is unchanged since January 2015. No acute infiltrate. No suspicious nodule or mass. The cardiomediastinal silhouette is normal.  IMPRESSION: No active cardiopulmonary disease.   Past medical hx Past Medical History:  Diagnosis Date  . COPD (chronic obstructive pulmonary disease) ([place])   . Hypertension   . Osteoarthrosis   . Varicose veins      Past surgical hx, Family hx, Social hx all reviewed.  Current Outpatient Prescriptions on  File Prior to Visit  Medication Sig  . albuterol (PROAIR HFA) 108 (90 Base) MCG/ACT inhaler Inhale 1-2 puffs into the lungs every 6 (six) hours as needed for shortness of breath.  . busPIRone (BUSPAR) 15 MG tablet Take 7.5 mg by mouth 2 (two) times daily.  . OXYGEN Oxygen 2.5 lpm with sleep and as needed during the  day, 3lpm with exertion  . Respiratory Therapy Supplies (FLUTTER) DEVI 1 Device by Does not apply route as needed.  Marland Kitchen azithromycin (ZITHROMAX) 250 MG tablet Take 1 tablet (250 mg total) by mouth daily. (Patient not taking: Reported on 04/19/2016)  . STIOLTO RESPIMAT 2.5-2.5 MCG/ACT AERS USE 2 INHALATIONS DAILY (Patient not taking: Reported on 04/19/2016)   No current facility-administered medications on file prior to visit.      No Known Allergies  Review Of Systems:  Constitutional:   No  weight loss, night sweats,  Fevers, chills, fatigue, or  lassitude.  HEENT:   No headaches,  Difficulty swallowing,  Tooth/dental problems, or  Sore throat,                No sneezing, itching, ear ache, nasal congestion, post nasal drip,   CV:  No chest pain,  Orthopnea, PND, swelling in lower extremities, anasarca, dizziness, palpitations, syncope.   GI  No heartburn, indigestion, abdominal pain, nausea, vomiting, diarrhea, change in bowel habits, loss of appetite, bloody stools.   Resp: + shortness of breath with exertion less at rest.  No excess mucus, no productive cough,  No non-productive cough,  No coughing up of blood.  No change in color of mucus.  No wheezing.  No chest wall deformity  Skin: no rash or lesions.  GU: no dysuria, change in color of urine, no urgency or frequency.  No flank pain, no hematuria   MS:  No joint pain or swelling.  No decreased range of motion.  No back pain.  Psych:  No change in mood or affect. No depression or anxiety.  No memory loss.   Vital Signs BP 130/80 (BP Location: Left Arm, Patient Position: Sitting, Cuff Size: Normal)   Pulse 80   Ht 5\' 8"  (1.727 m)   Wt 205 lb (93 kg)   SpO2 99%   BMI 31.17 kg/m    Physical Exam:  General- No distress,  A&Ox3, pleasant and anxious ENT: No sinus tenderness, TM clear, pale nasal mucosa, no oral exudate,no post nasal drip, no LAN Cardiac: S1, S2, regular rate and rhythm, no murmur Chest: No wheeze/ rales/  dullness; no accessory muscle use, no nasal flaring, no sternal retractions Abd.: Soft Non-tender Ext: No clubbing cyanosis, edema Neuro:  Deconditioned at baseline, alert and oriented 3, moving all extremities 4, follows commands Skin: No rashes, warm and dry Psych: Anxious   Assessment/Plan  COPD (chronic obstructive pulmonary disease) Gold D Gold Grade D COPD, with improvement in symptoms after Levaquin and Pred Taper Tolerated change from Stialto to Trelegy as maintenance Plan: Continue using Trelegy  1 puff daily, but try using it later in the day ( 8 am instead of 5:30 am) Continue using your Albuterol as needed. Continue Pulmonary rehab as you have been doing. Follow up in 1 month with NP  to evaluate later usage of Trelegy  If tolerates better, prescribe ( Given samples) Follow up with Dr. Lake Bells in 3 months. Please contact office for sooner follow up if symptoms do not improve or worsen or seek emergency care      Chronic  respiratory failure with hypoxia ([place]) Ambulatory saturation office today on room air with sats dropping to 87%. Saturations rebounded to 92% at 2 L nasal cannula. Plan Continue wearing oxygen at 2 L nasal cannula.  Titrate oxygen for saturations between 88 and 92%. Please contact office for sooner follow up if symptoms do not improve or worsen or seek emergency care     Magdalen Spatz, NP 04/19/2016  11:30 PM

## 2016-04-19 NOTE — Assessment & Plan Note (Signed)
Gold Grade D COPD, with improvement in symptoms after Levaquin and Pred Taper Tolerated change from Stialto to Trelegy as maintenance Plan: Continue using Trelegy  1 puff daily, but try using it later in the day ( 8 am instead of 5:30 am) Continue using your Albuterol as needed. Continue Pulmonary rehab as you have been doing. Follow up in 1 month with NP  to evaluate later usage of Trelegy  If tolerates better, prescribe ( Given samples) Follow up with Dr. Lake Bells in 3 months. Please contact office for sooner follow up if symptoms do not improve or worsen or seek emergency care

## 2016-04-19 NOTE — Assessment & Plan Note (Signed)
Ambulatory saturation office today on room air with sats dropping to 87%. Saturations rebounded to 92% at 2 L nasal cannula. Plan Continue wearing oxygen at 2 L nasal cannula.  Titrate oxygen for saturations between 88 and 92%. Please contact office for sooner follow up if symptoms do not improve or worsen or seek emergency care

## 2016-04-19 NOTE — Patient Instructions (Addendum)
It is nice to meet you today.  I am glad you are feeling better. Continue using Trelegy  1 puff daily, but try using it later in the day ( 8 am instead of 5:30 am) Continue using your Albuterol as needed. Continue Pulmonary rehab as you have been doing. Follow up in 1 month with NP  to evaluate later usage of Trelegy Follow up with Dr. Lake Bells in 3 months. Please contact office for sooner follow up if symptoms do not improve or worsen or seek emergency care

## 2016-04-20 ENCOUNTER — Encounter (HOSPITAL_COMMUNITY)
Admission: RE | Admit: 2016-04-20 | Discharge: 2016-04-20 | Disposition: A | Discharge status: Home / Self Care | Payer: Medicare Other | Source: Ambulatory Visit | Attending: Pulmonary Disease | Admitting: Pulmonary Disease

## 2016-04-23 ENCOUNTER — Telehealth: Payer: Self-pay | Admitting: *Deleted

## 2016-04-23 ENCOUNTER — Encounter (HOSPITAL_COMMUNITY)
Admission: RE | Admit: 2016-04-23 | Discharge: 2016-04-23 | Disposition: A | Discharge status: Home / Self Care | Payer: Medicare Other | Source: Ambulatory Visit | Attending: Pulmonary Disease | Admitting: Pulmonary Disease

## 2016-04-23 NOTE — Telephone Encounter (Signed)
Called spoke with patient and discussed BQ/SG's recommendations to stop the azithromycin d/t side effects Pt voiced his understanding Med list updated Azithromycin added to allergy/intolerance list Nothing further needed; will sign off

## 2016-04-23 NOTE — Progress Notes (Signed)
Reviewed, agree 

## 2016-04-23 NOTE — Telephone Encounter (Signed)
-----   Message from Magdalen Spatz, NP sent at 04/23/2016  4:55 PM EDT ----- Regarding: Ok to stop daily Azithromycin Please let the patient know it is ok to stop taking the daily Azithromycin per Dr. Lake Bells.Thanks so much.  ----- Message ----- From: Juanito Doom, MD Sent: 04/23/2016  10:42 AM To: Magdalen Spatz, NP  OK to hold azithro given this side effect.  Daily azithromycin has been shown to reduce exacerbations in COPD patients with recurrent exacerbations, FYI. That is why I had him on it.  ----- Message ----- From: Magdalen Spatz, NP Sent: 04/19/2016  11:32 PM To: Juanito Doom, MD  Dr. Lake Bells, I saw Mr. Kapur is follow-up today. He's done well with his Levaquin and prednisone taper. You had him on maintenance azithromycin therapy for suspected MAI or underlying infection. Per the patient's wife she has noticed that he has memory loss with the chronic azithromycin. ( Cleared when he came off it to take Levaquin.)  Please advise patient on whether or not you  would like him to continue with maintenance azithromycin. Thank you so much  Judson Roch

## 2016-04-25 ENCOUNTER — Encounter (HOSPITAL_COMMUNITY): Payer: Medicare Other

## 2016-04-25 ENCOUNTER — Telehealth: Payer: Self-pay | Admitting: Pulmonary Disease

## 2016-04-25 NOTE — Telephone Encounter (Signed)
Noted, I am waiting for speciation

## 2016-04-25 NOTE — Telephone Encounter (Signed)
Catina @ [facility] called with a call report on patients sputum culture that was sent off.  They have isolated AFB.  Will send to Dr Lake Bells to make him aware.

## 2016-04-27 ENCOUNTER — Encounter (HOSPITAL_COMMUNITY)
Admission: RE | Admit: 2016-04-27 | Discharge: 2016-04-27 | Disposition: A | Discharge status: Home / Self Care | Payer: Medicare Other | Source: Ambulatory Visit | Attending: Pulmonary Disease | Admitting: Pulmonary Disease

## 2016-04-30 ENCOUNTER — Encounter (HOSPITAL_COMMUNITY): Payer: Medicare Other

## 2016-04-30 DIAGNOSIS — J432 Centrilobular emphysema: Secondary | ICD-10-CM | POA: Insufficient documentation

## 2016-05-01 ENCOUNTER — Encounter: Payer: Self-pay | Admitting: Pulmonary Disease

## 2016-05-01 ENCOUNTER — Telehealth: Payer: Self-pay | Admitting: Pulmonary Disease

## 2016-05-01 DIAGNOSIS — A159 Respiratory tuberculosis unspecified: Secondary | ICD-10-CM | POA: Insufficient documentation

## 2016-05-01 HISTORY — DX: Respiratory tuberculosis unspecified: A15.9

## 2016-05-01 NOTE — Telephone Encounter (Signed)
Spoke with Hinton Dyer at [facility], requesting sputum cultures to be faxed to her attn at [phone].  These have been faxed.  Nothing further needed.

## 2016-05-02 ENCOUNTER — Encounter (HOSPITAL_COMMUNITY): Payer: Medicare Other

## 2016-05-04 ENCOUNTER — Encounter (HOSPITAL_COMMUNITY): Payer: Medicare Other

## 2016-05-04 LAB — FUNGUS CULTURE W SMEAR

## 2016-05-07 ENCOUNTER — Encounter (HOSPITAL_COMMUNITY): Payer: Medicare Other

## 2016-05-07 ENCOUNTER — Telehealth: Payer: Self-pay | Admitting: Pulmonary Disease

## 2016-05-07 NOTE — Telephone Encounter (Signed)
BQ---  Took call report on this pt today from [place] ---[facility]  AFB was +microbacterium TB.  Please advise if anything further is needed. thanks

## 2016-05-07 NOTE — Telephone Encounter (Signed)
Thanks, we have communicated with the patient and he is taking DOT therapy through the [place]

## 2016-05-09 ENCOUNTER — Encounter (HOSPITAL_COMMUNITY): Payer: Medicare Other

## 2016-05-11 ENCOUNTER — Encounter (HOSPITAL_COMMUNITY): Payer: Medicare Other

## 2016-05-14 ENCOUNTER — Encounter (HOSPITAL_COMMUNITY)
Admission: RE | Admit: 2016-05-14 | Discharge: 2016-05-14 | Disposition: A | Discharge status: Home / Self Care | Payer: Medicare Other | Source: Ambulatory Visit | Attending: Pulmonary Disease | Admitting: Pulmonary Disease

## 2016-05-16 ENCOUNTER — Encounter (HOSPITAL_COMMUNITY)
Admission: RE | Admit: 2016-05-16 | Discharge: 2016-05-16 | Disposition: A | Discharge status: Home / Self Care | Payer: Medicare Other | Source: Ambulatory Visit | Attending: Pulmonary Disease | Admitting: Pulmonary Disease

## 2016-05-18 ENCOUNTER — Encounter (HOSPITAL_COMMUNITY)
Admission: RE | Admit: 2016-05-18 | Discharge: 2016-05-18 | Disposition: A | Discharge status: Home / Self Care | Payer: Medicare Other | Source: Ambulatory Visit | Attending: Pulmonary Disease | Admitting: Pulmonary Disease

## 2016-05-19 LAB — AFB CULTURE WITH SMEAR (NOT AT ARMC)

## 2016-05-21 ENCOUNTER — Encounter (HOSPITAL_COMMUNITY)
Admission: RE | Admit: 2016-05-21 | Discharge: 2016-05-21 | Disposition: A | Discharge status: Home / Self Care | Payer: Medicare Other | Source: Ambulatory Visit | Attending: Pulmonary Disease | Admitting: Pulmonary Disease

## 2016-05-23 ENCOUNTER — Encounter (HOSPITAL_COMMUNITY): Payer: Medicare Other

## 2016-05-24 ENCOUNTER — Ambulatory Visit (INDEPENDENT_AMBULATORY_CARE_PROVIDER_SITE_OTHER): Payer: Medicare Other | Admitting: Acute Care

## 2016-05-24 ENCOUNTER — Encounter: Payer: Self-pay | Admitting: Acute Care

## 2016-05-24 DIAGNOSIS — J42 Unspecified chronic bronchitis: Secondary | ICD-10-CM

## 2016-05-24 MED ORDER — FLUTICASONE-UMECLIDIN-VILANT 100-62.5-25 MCG/INH IN AEPB: 1.0000 | INHALATION_SPRAY | Freq: Every day | RESPIRATORY_TRACT | 0 refills | Status: DC

## 2016-05-24 MED ORDER — FLUTICASONE-UMECLIDIN-VILANT 100-62.5-25 MCG/INH IN AEPB: 1.0000 | INHALATION_SPRAY | Freq: Every day | RESPIRATORY_TRACT | 6 refills | Status: DC

## 2016-05-24 NOTE — Patient Instructions (Addendum)
It is good to see you again. We will send in a prescription for Trelegy inhaler. Continue using once daily about 8 am each day. Rinse mouth after use. Continue using your rescue inhaler as needed for breakthrough shortness of breath or wheezing. Continue to maintain your oxygen saturations and goal is 88-92% Continue Pulmonary rehab as you are doing. Continue your antibiotic therapy through [facility] as you have been doing . Continue follow up with [facility] department regarding your abnormal sputum Follow up with Dr. Lake Bells June 26th as is already scheduled. Please contact office for sooner follow up if symptoms do not improve or worsen or seek emergency care

## 2016-05-24 NOTE — Progress Notes (Signed)
History of Present Illness Gilbert Jefferson is a 66 y.o. male with    Synopsis: Former patient of Dr. Joya Gaskins with COPD Copd gold D  01/18/2012 ONO RA normal Arlyce Harman 10/08/12: FeV1 29% FeV1/FVC 41% Alpha one antitrypsin 04/10/2013>>normal 145 Spiro 04/10/2013>>FeV1 26% FVC 56% FeV1/FVC 53% patient COPD   05/24/2016  Follow up to evaluate tolerence of use of trelegy:( Stialto was getting too costly)  Pt. States he has been trying to use his Trelegy later in the day as his previous complaint was that he felt it did not lasted a full 24 hours. ( He was using it at 5:30 am). He states that he is using his rescue inhaler about once at night for breakthrough shortness of breath. He states that he feels the trelegy works better in regard to his breathing, but he states he feels it makes him more nervous.We discussed whether he could handle the nervousness ( which he states wares off 30 minutes after using his inhaler) for the benefit of better breathing? He said he would like to continue the Trelegy, and if the nervous side effect becomes too hard to handle, he will let us know. The Trelegy should be a better cost point for him when  compared to his [place]. He is currently being treated ( DOT Therapy) by [facility] Department for DNA probe positive for Mycobacterium tuberculosis  In sputum collected 04/06/2016.He is taking medication ( witnessed by [facility] staff via daily face time) as prescribed. He states that all TB testing has resulted negative, and that his whole family was tested and all have been negative. He has been told he does not need to wear a mask. He denies fever, chest pain, orthopnea or hemoptysis. He is enjoying pulmonary rehab.  Test Results:  CBC Latest Ref Rng & Units 03/24/2015 04/28/2010 04/28/2010  WBC 4.0 - 10.5 K/uL 7.0 - 9.9  Hemoglobin 13.0 - 17.0 g/dL 13.8 16.0 15.9  Hematocrit 39.0 - 52.0 % 40.9 47.0 45.1  Platelets 150.0 - 400.0 K/uL 254.0 - 253    BMP Latest Ref Rng &  Units 04/28/2010  Glucose 70 - 99 mg/dL 80  BUN 6 - 23 mg/dL 17  Creatinine 0.4 - 1.5 mg/dL 1.0  Sodium 135 - 145 mEq/L 140  Potassium 3.5 - 5.1 mEq/L 5.2(H)  Chloride 96 - 112 mEq/L 105     Past medical hx Past Medical History:  Diagnosis Date  . COPD (chronic obstructive pulmonary disease) ([place])   . Hypertension   . Osteoarthrosis   . Varicose veins      Social History  Substance Use Topics  . Smoking status: Former Smoker    Packs/day: 1.50    Years: 45.00    Types: Cigarettes    Quit date: 12/31/2011  . Smokeless tobacco: Never Used  . Alcohol use No    Tobacco Cessation: Former smoker 67.5 pack year smoking history, quit 12/2011.  Past surgical hx, Family hx, Social hx all reviewed.  Current Outpatient Prescriptions on File Prior to Visit  Medication Sig  . albuterol (PROAIR HFA) 108 (90 Base) MCG/ACT inhaler Inhale 1-2 puffs into the lungs every 6 (six) hours as needed for shortness of breath.  . busPIRone (BUSPAR) 15 MG tablet Take 7.5 mg by mouth 2 (two) times daily.  . OXYGEN Oxygen 2.5 lpm with sleep and as needed during the day, 3lpm with exertion  . Respiratory Therapy Supplies (FLUTTER) DEVI 1 Device by Does not apply route as needed.  Marland Kitchen  STIOLTO RESPIMAT 2.5-2.5 MCG/ACT AERS USE 2 INHALATIONS DAILY (Patient not taking: Reported on 05/24/2016)   No current facility-administered medications on file prior to visit.      Allergies  Allergen Reactions  . Azithromycin     Wife noted memory loss when patient was on this chronically    Review Of Systems:  Constitutional:   No  weight loss, night sweats,  Fevers, chills, fatigue, or  lassitude.  HEENT:   No headaches,  Difficulty swallowing,  Tooth/dental problems, or  Sore throat,                No sneezing, itching, ear ache, nasal congestion, post nasal drip,   CV:  No chest pain,  Orthopnea, PND, swelling in lower extremities, anasarca, dizziness, palpitations, syncope.   GI  No heartburn,  indigestion, abdominal pain, nausea, vomiting, diarrhea, change in bowel habits, loss of appetite, bloody stools.   Resp: + shortness of breath with exertion or at rest.  + excess mucus, no productive cough,  No non-productive cough,  No coughing up of blood.  No change in color of mucus.  + wheezing.  No chest wall deformity  Skin: no rash or lesions.  GU: no dysuria, change in color of urine, no urgency or frequency.  No flank pain, no hematuria   MS:  No joint pain or swelling.  No decreased range of motion.  No back pain.  Psych:  No change in mood or affect. No depression  + anxiety.  No memory loss.   Vital Signs BP 140/66 (BP Location: Right Arm, Patient Position: Sitting, Cuff Size: Normal)   Pulse 77   Ht 5\' 8"  (1.727 m)   Wt 205 lb 6.4 oz (93.2 kg)   SpO2 97%   BMI 31.23 kg/m    Physical Exam:  General- No distress,  A&Ox3, pleasant male wearing oxygen per nasal canula ENT: No sinus tenderness, TM clear, pale nasal mucosa, no oral exudate,no post nasal drip, no LAN Cardiac: S1, S2, regular rate and rhythm, no murmur Chest: No wheeze/ rales/ dullness; no accessory muscle use, no nasal flaring, no sternal retractions, diminished per bases. Abd.: Soft Non-tender, obese Ext: No clubbing cyanosis, edema Neuro:  normal strength Skin: No rashes, warm and dry Psych: normal mood and behavior   Assessment/Plan  COPD (chronic obstructive pulmonary disease) Gold D Gold COPD Gold with continue improvement in dyspnea with use of Trelegy ( Previously using Stialto but became too expensive) States he has 30 minutes of increased anxiety after use Uses his rescue inhaler about once nightly. Plan: We will send in a prescription for Trelegy inhaler. Continue using once daily about 8 am each day. Rinse mouth after use. Continue using your rescue inhaler as needed for breakthrough shortness of breath or wheezing. Continue to maintain your oxygen saturations and goal is  88-92% Continue Pulmonary rehab as you are doing.    Chronic Respiratory Failure with Hypoxia Plan Continue wearing oxygen at 2 L [place] Saturation goal is 88-92%  Mycobacterium tuberculosis infection Per patient blood work was negative for TB Per patient family were all negative for TB Plan Continue  Follow up with  [facility] Department for DOT therapy.  Magdalen Spatz, NP 05/24/2016  12:40 PM

## 2016-05-24 NOTE — Assessment & Plan Note (Signed)
Gold COPD Gold with continue improvement in dyspnea with use of Trelegy ( Previously using Stialto but became too expensive) States he has 30 minutes of increased anxiety after use Uses his rescue inhaler about once nightly. Plan: We will send in a prescription for Trelegy inhaler. Continue using once daily about 8 am each day. Rinse mouth after use. Continue using your rescue inhaler as needed for breakthrough shortness of breath or wheezing. Continue to maintain your oxygen saturations and goal is 88-92% Continue Pulmonary rehab as you are doing.

## 2016-05-25 ENCOUNTER — Telehealth: Payer: Self-pay

## 2016-05-25 ENCOUNTER — Encounter (HOSPITAL_COMMUNITY)
Admission: RE | Admit: 2016-05-25 | Discharge: 2016-05-25 | Disposition: A | Discharge status: Home / Self Care | Payer: Medicare Other | Source: Ambulatory Visit | Attending: Pulmonary Disease | Admitting: Pulmonary Disease

## 2016-05-25 NOTE — Telephone Encounter (Signed)
Started PA on covermymeds for Trelegy. PA pending approval  [employer] is reviewing your PA request. Typically an electronic response will be received within 72 hours. To check for an update later, open this request from your dashboard. You may close this dialog and return to your dashboard to perform other tasks.

## 2016-05-28 ENCOUNTER — Encounter (HOSPITAL_COMMUNITY)
Admission: RE | Admit: 2016-05-28 | Discharge: 2016-05-28 | Disposition: A | Discharge status: Home / Self Care | Payer: Medicare Other | Source: Ambulatory Visit | Attending: Pulmonary Disease | Admitting: Pulmonary Disease

## 2016-05-30 ENCOUNTER — Encounter (HOSPITAL_COMMUNITY)
Admission: RE | Admit: 2016-05-30 | Discharge: 2016-05-30 | Disposition: A | Discharge status: Home / Self Care | Payer: Medicare Other | Source: Ambulatory Visit | Attending: Pulmonary Disease | Admitting: Pulmonary Disease

## 2016-05-30 DIAGNOSIS — J432 Centrilobular emphysema: Secondary | ICD-10-CM | POA: Diagnosis not present

## 2016-05-30 NOTE — Telephone Encounter (Signed)
Received response from [employer] for Trelegy. Approved through 01/28/2017

## 2016-05-31 ENCOUNTER — Telehealth: Payer: Self-pay | Admitting: Pulmonary Disease

## 2016-05-31 MED ORDER — DOXYCYCLINE HYCLATE 100 MG PO TABS: ORAL_TABLET | ORAL | 0 refills | Status: DC

## 2016-05-31 NOTE — Telephone Encounter (Signed)
Patient returned phone call..ert ° °

## 2016-05-31 NOTE — Telephone Encounter (Signed)
Spoke with pt, aware of recs.  rx previously sent to pharmacy.  Nothing further needed.

## 2016-05-31 NOTE — Telephone Encounter (Signed)
lmtcb X1 for pt to make aware of recs.   Doxycycline escribed to preferred pharmacy.

## 2016-05-31 NOTE — Telephone Encounter (Signed)
We are still getting reports of flu in the area, just to be aware  In addition to his TB meds, it should be ok for him to add doxycycline 100 mg, # 8, 2 today then one daily  Stay well hydrated.

## 2016-05-31 NOTE — Telephone Encounter (Signed)
Pt c/o increased sob, fatigue, prod cough with green mucus, runny nose, temp of 100.4 X 2-3 days. Denies chest pain, nausea, vomiting.  Pt has taken claritin D and advil to help with symptoms.    Pt uses [facility].    Pt notes that he is currently taking 5 meds for TB- pt was unaware of the names of the meds, states that the health dept is managing these for him.    Sending to DOD as BQ is unavailable- CY please advise on further recs.  Thanks!

## 2016-06-01 ENCOUNTER — Encounter (HOSPITAL_COMMUNITY): Payer: Medicare Other

## 2016-06-01 ENCOUNTER — Telehealth (HOSPITAL_COMMUNITY): Payer: Self-pay | Admitting: Family Medicine

## 2016-06-01 ENCOUNTER — Telehealth: Payer: Self-pay | Admitting: Pulmonary Disease

## 2016-06-01 NOTE — Telephone Encounter (Signed)
Called and spoke with pt and he stated that he already has the nebulizer medications, but his machine is old and is needing a new one.  He would like to buy this outright.  He needs the order faxed to [facility] at the fax number given.  BQ will not be back in until Monday.  Please advise. thanks

## 2016-06-04 ENCOUNTER — Telehealth: Payer: Self-pay | Admitting: Pulmonary Disease

## 2016-06-04 ENCOUNTER — Encounter (HOSPITAL_COMMUNITY): Payer: Medicare Other

## 2016-06-04 ENCOUNTER — Ambulatory Visit (INDEPENDENT_AMBULATORY_CARE_PROVIDER_SITE_OTHER)
Admission: RE | Admit: 2016-06-04 | Discharge: 2016-06-04 | Disposition: A | Discharge status: Home / Self Care | Payer: Medicare Other | Source: Ambulatory Visit | Attending: Acute Care | Admitting: Acute Care

## 2016-06-04 ENCOUNTER — Ambulatory Visit (INDEPENDENT_AMBULATORY_CARE_PROVIDER_SITE_OTHER): Payer: Medicare Other | Admitting: Acute Care

## 2016-06-04 ENCOUNTER — Encounter: Payer: Self-pay | Admitting: Acute Care

## 2016-06-04 ENCOUNTER — Telehealth (HOSPITAL_COMMUNITY): Payer: Self-pay | Admitting: Family Medicine

## 2016-06-04 VITALS — BP 132/70 | HR 78 | Temp 97.9°F | Ht 68.0 in | Wt 201.0 lb

## 2016-06-04 DIAGNOSIS — J9611 Chronic respiratory failure with hypoxia: Secondary | ICD-10-CM

## 2016-06-04 DIAGNOSIS — J42 Unspecified chronic bronchitis: Secondary | ICD-10-CM | POA: Diagnosis not present

## 2016-06-04 MED ORDER — DOXYCYCLINE HYCLATE 100 MG PO TABS: 100.0000 mg | ORAL_TABLET | Freq: Two times a day (BID) | ORAL | 0 refills | Status: DC

## 2016-06-04 MED ORDER — PREDNISONE 10 MG PO TABS: ORAL_TABLET | ORAL | 0 refills | Status: DC

## 2016-06-04 MED ORDER — ALBUTEROL SULFATE (2.5 MG/3ML) 0.083% IN NEBU: 2.5000 mg | INHALATION_SOLUTION | Freq: Four times a day (QID) | RESPIRATORY_TRACT | 11 refills | Status: DC | PRN

## 2016-06-04 MED ORDER — LEVALBUTEROL HCL 0.63 MG/3ML IN NEBU: 0.6300 mg | INHALATION_SOLUTION | Freq: Once | RESPIRATORY_TRACT | Status: DC

## 2016-06-04 MED ORDER — FLUTICASONE-UMECLIDIN-VILANT 100-62.5-25 MCG/INH IN AEPB: 1.0000 | INHALATION_SPRAY | Freq: Every day | RESPIRATORY_TRACT | 0 refills | Status: DC

## 2016-06-04 NOTE — Progress Notes (Signed)
Reviewed, agree 

## 2016-06-04 NOTE — Patient Instructions (Addendum)
We will ensure the neb machine request has been called into the [facility]. Use the albuterol neb treatments at home as needed for shortness of breath or wheezing. We will treat you with Doxycycline 100 mg twice daily x 7 days Prednisone taper; 10 mg tablets: 4 tabs x 2 days, 3 tabs x 2 days, 2 tabs x 2 days 1 tab x 2 days then stop. We will give you a breathing treatment now. Mucinex 1200 every morning. CXR  Today ( COPD exacerbation) We will call you with results. We will see if we have any Trelegy Samples Follow up with Eric Form, NP in 3 weeks . Please contact office for sooner follow up if symptoms do not improve or worsen or seek emergency care

## 2016-06-04 NOTE — Progress Notes (Signed)
History of Present Illness Gilbert Jefferson is a 66 y.o. male with COPD Gold Stage D. He is followed by Dr. Lake Bells  Synopsis: Former patient of Dr. Joya Gaskins with COPD Copd gold D  01/18/2012 ONO RA normal Arlyce Harman 10/08/12: FeV1 29% FeV1/FVC 41% Alpha one antitrypsin 04/10/2013>>normal 145 Spiro 04/10/2013>>FeV1 26% FVC 56% FeV1/FVC 53% patient COPD   06/04/2016 Acute OV: Pt states he has had worsening of his shortness of breath and chest congestion starting Tuesday 05/29/2016.The patient states he had fever 5/1-5/6 of about 100.4.He states that he has not had fever this morning. Secretions are greenish yellow and thick, and heart cough. He is having increased need for use of his rescue inhaler for shortness of breath on a more frequent basis.Marland KitchenHe states that he feels he needs nebulizer treatments for home. He states that he called the office Friday requesting a nebulizer machine. We will ensure that the order was sent to the DME. Patient denies chest pain, orthopnea, or hemoptysis. He denies leg swelling. Patient does question whether or not to use a trilogy is causing some bone pain. We have agreed to address this issue at his follow-up visit.  Test Results:  CBC Latest Ref Rng & Units 03/24/2015 04/28/2010 04/28/2010  WBC 4.0 - 10.5 K/uL 7.0 - 9.9  Hemoglobin 13.0 - 17.0 g/dL 13.8 16.0 15.9  Hematocrit 39.0 - 52.0 % 40.9 47.0 45.1  Platelets 150.0 - 400.0 K/uL 254.0 - 253    BMP Latest Ref Rng & Units 04/28/2010  Glucose 70 - 99 mg/dL 80  BUN 6 - 23 mg/dL 17  Creatinine 0.4 - 1.5 mg/dL 1.0  Sodium 135 - 145 mEq/L 140  Potassium 3.5 - 5.1 mEq/L 5.2(H)  Chloride 96 - 112 mEq/L 105     Past medical hx Past Medical History:  Diagnosis Date  . COPD (chronic obstructive pulmonary disease) ([place])   . Hypertension   . Osteoarthrosis   . Varicose veins      Social History  Substance Use Topics  . Smoking status: Former Smoker    Packs/day: 1.50    Years: 45.00    Types: Cigarettes    Quit  date: 12/31/2011  . Smokeless tobacco: Never Used  . Alcohol use No    Tobacco Cessation: Patient is a former smoker. He quit in 2013. He had an 67.5-pack-year smoking history.  Past surgical hx, Family hx, Social hx all reviewed.  Current Outpatient Prescriptions on File Prior to Visit  Medication Sig  . albuterol (PROAIR HFA) 108 (90 Base) MCG/ACT inhaler Inhale 1-2 puffs into the lungs every 6 (six) hours as needed for shortness of breath.  . busPIRone (BUSPAR) 15 MG tablet Take 7.5 mg by mouth 2 (two) times daily.  Marland Kitchen doxycycline (VIBRA-TABS) 100 MG tablet Take 2 tabs today, then 1 daily until gone  . Fluticasone-Umeclidin-Vilant (TRELEGY ELLIPTA) 100-62.5-25 MCG/INH AEPB Inhale 1 puff into the lungs daily.  . OXYGEN Oxygen 2.5 lpm with sleep and as needed during the day, 3lpm with exertion  . Respiratory Therapy Supplies (FLUTTER) DEVI 1 Device by Does not apply route as needed.   No current facility-administered medications on file prior to visit.      Allergies  Allergen Reactions  . Azithromycin     Wife noted memory loss when patient was on this chronically    Review Of Systems:  Constitutional:   No  weight loss, night sweats,  +Fevers, +chills, +fatigue, or  lassitude.  HEENT:   No headaches,  Difficulty  swallowing,  Tooth/dental problems, or  Sore throat,                No sneezing, itching, ear ache, nasal congestion, post nasal drip,   CV:  No chest pain,  Orthopnea, PND, swelling in lower extremities, anasarca, dizziness, palpitations, syncope.   GI  No heartburn, indigestion, abdominal pain, nausea, vomiting, diarrhea, change in bowel habits, loss of appetite, bloody stools.   Resp: + shortness of breath with exertion or at rest.  + excess mucus, no productive cough,  + non-productive cough,  No coughing up of blood.  + change in color of mucus.  + wheezing.  No chest wall deformity  Skin: no rash or lesions.  GU: no dysuria, change in color of urine, no  urgency or frequency.  No flank pain, no hematuria   MS:  No joint pain or swelling.  No decreased range of motion.  No back pain.  Psych:  No change in mood or affect. No depression or anxiety.  No memory loss.   Vital Signs BP 132/70 (BP Location: Left Arm, Cuff Size: Normal)   Pulse 78   Temp 97.9 F (36.6 C) (Oral)   Ht 5\' 8"  (1.727 m)   Wt 201 lb (91.2 kg)   SpO2 98%   BMI 30.56 kg/m    Physical Exam:  General- No distress,  A&Ox3, pleasant ENT: No sinus tenderness, TM clear, pale nasal mucosa, no oral exudate,no post nasal drip, no LAN Cardiac: S1, S2, regular rate and rhythm, no murmur Chest: Positive wheeze/ no rales/ dullness; no accessory muscle use, no nasal flaring, no sternal retractions Abd.: Soft Non-tender, obese Ext: No clubbing cyanosis, edema Neuro:  normal strength Skin: No rashes, warm and dry Psych: normal mood and behavior   Assessment/Plan  COPD (chronic obstructive pulmonary disease) Gold D Gold COPD D flare Plan We will ensure the neb machine request has been called into the [place]. Use the albuterol neb treatments at home as needed for shortness of breath or wheezing. We will treat you with Doxycycline 100 mg twice daily x 7 days Prednisone taper; 10 mg tablets: 4 tabs x 2 days, 3 tabs x 2 days, 2 tabs x 2 days 1 tab x 2 days then stop. We will give you a breathing treatment now. Mucinex 1200 every morning. CXR  Today ( COPD exacerbation) We will call you with results. We will see if we have any Trelegy Samples Follow up with Eric Form, NP in 3 weeks . Please contact office for sooner follow up if symptoms do not improve or worsen or seek emergency care     Chronic respiratory failure with hypoxia ([place]) Continue wearing oxygen at 2 L nasal cannula. Saturation goals are between 88 and 92%.      Magdalen Spatz, NP 06/04/2016  1:37 PM

## 2016-06-04 NOTE — Telephone Encounter (Signed)
Patient is calling back as antibiotic,Doxycycline 100 mg twice daily x 7 days, has still not been sent over to [facility].  CB is [phone].

## 2016-06-04 NOTE — Assessment & Plan Note (Signed)
Gold COPD D flare Plan We will ensure the neb machine request has been called into the [facility]. Use the albuterol neb treatments at home as needed for shortness of breath or wheezing. We will treat you with Doxycycline 100 mg twice daily x 7 days Prednisone taper; 10 mg tablets: 4 tabs x 2 days, 3 tabs x 2 days, 2 tabs x 2 days 1 tab x 2 days then stop. We will give you a breathing treatment now. Mucinex 1200 every morning. CXR  Today ( COPD exacerbation) We will call you with results. We will see if we have any Trelegy Samples Follow up with Eric Form, NP in 3 weeks . Please contact office for sooner follow up if symptoms do not improve or worsen or seek emergency care

## 2016-06-04 NOTE — Assessment & Plan Note (Signed)
Continue wearing oxygen at 2 L nasal cannula. Saturation goals are between 88 and 92%.

## 2016-06-04 NOTE — Telephone Encounter (Signed)
This order was already sent today at [place] with SG.  Will close encounter.

## 2016-06-04 NOTE — Telephone Encounter (Signed)
OK to send order for new nebulizer machine

## 2016-06-04 NOTE — Telephone Encounter (Signed)
Per SG instructions from 06/04/16 OV note, will treat with doxycycline 100mg  bid for 7 days. It does not appear that RX was sent to [facility]. Rx has been sent to preferred pharmacy. Nothing further needed.

## 2016-06-05 ENCOUNTER — Telehealth: Payer: Self-pay | Admitting: Pulmonary Disease

## 2016-06-05 ENCOUNTER — Telehealth: Payer: Self-pay | Admitting: Acute Care

## 2016-06-05 DIAGNOSIS — Z09 Encounter for follow-up examination after completed treatment for conditions other than malignant neoplasm: Secondary | ICD-10-CM

## 2016-06-05 MED ORDER — ALBUTEROL SULFATE (2.5 MG/3ML) 0.083% IN NEBU: 2.5000 mg | INHALATION_SOLUTION | Freq: Four times a day (QID) | RESPIRATORY_TRACT | 11 refills | Status: DC | PRN

## 2016-06-05 NOTE — Telephone Encounter (Signed)
Spoke with pt, requesting albuterol neb refill to be sent to pharmacy.  This has been sent.  Nothing further needed.

## 2016-06-05 NOTE — Telephone Encounter (Signed)
Order entered for follow up chest xray to occur at 3 week follow up.

## 2016-06-05 NOTE — Progress Notes (Signed)
Spoke with patient and informed him of results and recommendations. Pt verbalized understanding and did not have any questions. Order entered for follow xray. Nothing further is needed.

## 2016-06-05 NOTE — Telephone Encounter (Signed)
Spoke w/patient wife, .results reviewed. Nothing further needed.  Notes recorded by Magdalen Spatz, NP on 06/05/2016 at 1:38 PM EDT Please call patient and let him know that there was a questionable early pneumonia noted on the chest x-ray yesterday. Please have him take the doxycycline and prednisone as was ordered yesterday for a week. Follow-up in 3 weeks as scheduled for a chest x-ray at that time. Please emphasize if he gets worse instead of better on the antibiotics to please come and see [place] sooner, or to seek emergency care. Thank you

## 2016-06-06 ENCOUNTER — Encounter (HOSPITAL_COMMUNITY): Payer: Medicare Other

## 2016-06-08 ENCOUNTER — Telehealth (HOSPITAL_COMMUNITY): Payer: Self-pay | Admitting: *Deleted

## 2016-06-08 ENCOUNTER — Encounter (HOSPITAL_COMMUNITY): Admission: RE | Admit: 2016-06-08 | Payer: Medicare Other | Source: Ambulatory Visit

## 2016-06-11 ENCOUNTER — Encounter (HOSPITAL_COMMUNITY)
Admission: RE | Admit: 2016-06-11 | Discharge: 2016-06-11 | Disposition: A | Discharge status: Home / Self Care | Payer: Medicare Other | Source: Ambulatory Visit | Attending: Pulmonary Disease | Admitting: Pulmonary Disease

## 2016-06-13 ENCOUNTER — Encounter (HOSPITAL_COMMUNITY)
Admission: RE | Admit: 2016-06-13 | Discharge: 2016-06-13 | Disposition: A | Discharge status: Home / Self Care | Payer: Medicare Other | Source: Ambulatory Visit | Attending: Pulmonary Disease | Admitting: Pulmonary Disease

## 2016-06-14 ENCOUNTER — Telehealth: Payer: Self-pay | Admitting: Pulmonary Disease

## 2016-06-14 MED ORDER — PREDNISONE 20 MG PO TABS: 20.0000 mg | ORAL_TABLET | Freq: Every day | ORAL | 0 refills | Status: DC

## 2016-06-14 NOTE — Telephone Encounter (Signed)
lmtcb X1 for pt to make aware of recs.  Prednisone called in to [facility]- pt's local pharmacy.

## 2016-06-14 NOTE — Telephone Encounter (Signed)
Can we order prednisone 20 mg/d for 1 week? Tell pt to call back if not better.   Monica Becton, MD 06/14/2016, 5:22 PM [facility] Pager [phone] After 3 pm or if no answer, call [phone]

## 2016-06-14 NOTE — Telephone Encounter (Signed)
Spoke with pt, who states during his acute visit with SG on 06/04/16 he was prescribed doxycycline and prednisone. Pt states he completed course of abx and prednisone taper on Tuesday. Pt feels that his symptoms have improved, however he is still having some chest congestion mild wheezing, mild prod cough with thick clear mucus and increased sob.  Denies any fever, chills or sweats. Pt taken mucinex with mild improvement.  AD please advise, as BQ is night float. Thanks.

## 2016-06-15 ENCOUNTER — Encounter (HOSPITAL_COMMUNITY)
Admission: RE | Admit: 2016-06-15 | Discharge: 2016-06-15 | Disposition: A | Discharge status: Home / Self Care | Payer: Medicare Other | Source: Ambulatory Visit | Attending: Pulmonary Disease | Admitting: Pulmonary Disease

## 2016-06-15 NOTE — Telephone Encounter (Signed)
Called and spoke with pt and he is aware of prednisone that has been sent to the pharmacy.  He is aware to call back if he is not better after this.

## 2016-06-18 ENCOUNTER — Encounter (HOSPITAL_COMMUNITY)
Admission: RE | Admit: 2016-06-18 | Discharge: 2016-06-18 | Disposition: A | Discharge status: Home / Self Care | Payer: Medicare Other | Source: Ambulatory Visit | Attending: Pulmonary Disease | Admitting: Pulmonary Disease

## 2016-06-20 ENCOUNTER — Encounter (HOSPITAL_COMMUNITY)
Admission: RE | Admit: 2016-06-20 | Discharge: 2016-06-20 | Disposition: A | Discharge status: Home / Self Care | Payer: Medicare Other | Source: Ambulatory Visit | Attending: Pulmonary Disease | Admitting: Pulmonary Disease

## 2016-06-22 ENCOUNTER — Encounter (HOSPITAL_COMMUNITY): Payer: Medicare Other

## 2016-06-25 ENCOUNTER — Encounter (HOSPITAL_COMMUNITY): Payer: Medicare Other

## 2016-06-27 ENCOUNTER — Encounter (HOSPITAL_COMMUNITY)
Admission: RE | Admit: 2016-06-27 | Discharge: 2016-06-27 | Disposition: A | Discharge status: Home / Self Care | Payer: Medicare Other | Source: Ambulatory Visit | Attending: Pulmonary Disease | Admitting: Pulmonary Disease

## 2016-06-29 ENCOUNTER — Ambulatory Visit (INDEPENDENT_AMBULATORY_CARE_PROVIDER_SITE_OTHER)
Admission: RE | Admit: 2016-06-29 | Discharge: 2016-06-29 | Disposition: A | Discharge status: Home / Self Care | Payer: Medicare Other | Source: Ambulatory Visit | Attending: Adult Health | Admitting: Adult Health

## 2016-06-29 ENCOUNTER — Ambulatory Visit (INDEPENDENT_AMBULATORY_CARE_PROVIDER_SITE_OTHER): Payer: Medicare Other | Admitting: Adult Health

## 2016-06-29 ENCOUNTER — Encounter: Payer: Self-pay | Admitting: Adult Health

## 2016-06-29 ENCOUNTER — Encounter (HOSPITAL_COMMUNITY): Payer: Medicare Other

## 2016-06-29 ENCOUNTER — Ambulatory Visit
Admission: RE | Admit: 2016-06-29 | Discharge: 2016-06-29 | Disposition: A | Discharge status: Home / Self Care | Payer: Medicare Other | Source: Ambulatory Visit | Attending: Acute Care | Admitting: Acute Care

## 2016-06-29 DIAGNOSIS — A159 Respiratory tuberculosis unspecified: Secondary | ICD-10-CM

## 2016-06-29 DIAGNOSIS — J432 Centrilobular emphysema: Secondary | ICD-10-CM | POA: Insufficient documentation

## 2016-06-29 DIAGNOSIS — J9611 Chronic respiratory failure with hypoxia: Secondary | ICD-10-CM

## 2016-06-29 DIAGNOSIS — J42 Unspecified chronic bronchitis: Secondary | ICD-10-CM

## 2016-06-29 DIAGNOSIS — Z09 Encounter for follow-up examination after completed treatment for conditions other than malignant neoplasm: Secondary | ICD-10-CM

## 2016-06-29 MED ORDER — FLUTICASONE-UMECLIDIN-VILANT 100-62.5-25 MCG/INH IN AEPB: 1.0000 | INHALATION_SPRAY | Freq: Every day | RESPIRATORY_TRACT | 0 refills | Status: AC

## 2016-06-29 MED ORDER — FLUTICASONE-UMECLIDIN-VILANT 100-62.5-25 MCG/INH IN AEPB
1.0000 | INHALATION_SPRAY | Freq: Every day | RESPIRATORY_TRACT | 4 refills | Status: DC
Start: 2016-06-29 — End: 2017-09-05

## 2016-06-29 NOTE — Addendum Note (Signed)
Addended by: Parke Poisson E on: 06/29/2016 01:12 PM   Modules accepted: Orders

## 2016-06-29 NOTE — Assessment & Plan Note (Signed)
Cont on therapy , follow up with Health Dept. Per protocol.

## 2016-06-29 NOTE — Addendum Note (Signed)
Addended by: Parke Poisson E on: 06/29/2016 11:31 AM   Modules accepted: Orders

## 2016-06-29 NOTE — Assessment & Plan Note (Signed)
Cont on O2  Check Innogen poc

## 2016-06-29 NOTE — Patient Instructions (Addendum)
Continue on TRELEGY 1 puff daily , rinse after use.  Call insurance company for drug formulary .  Patient assistance papers sent for TRELEGY .  Continue on Oxygen 2l/m .  Continue TB meds and follow up with Health Dept.  Chest xray today .  Follow up Dr. Lake Bells in 3 months and As needed

## 2016-06-29 NOTE — Progress Notes (Signed)
@Patient  ID: Gilbert Jefferson, male    DOB: 11-03-1950, 66 y.o.   MRN: [id number]  No chief complaint on file.   Referring provider: Maylon Peppers, MD  HPI: 66 year old male former smoker followed for Gold D COPD and oxygen dependent respiratory failure Mycobacterium TB dx 03/2016 , tx/ w/ DOT therapy  By [facility] Dept .   TEST  01/18/2012 ONO RA normal Gilbert Jefferson 10/08/12: FeV1 29% FeV1/FVC 41% Alpha one antitrypsin 04/10/2013>>normal 145 Spiro 04/10/2013>> FeV1 26% FVC 56% FeV1/FVC 53% patient COPD  Pulmonary rehab 2017  Sputum AFB 03/2016 neg . Positive DNA probe  Sputum cx 03/2016 nml flora    06/29/2016 Follow up: COPD GOLD D , O2 RF , TB  Patient returns for a one-month follow-up. Patient was seen last visit with a COPD exacerbation with possible early pneumonia. Chest x-ray showed atelectasis versus early infiltrate in the lingula. Patient was treated with doxycycline and a prednisone taper. He says he is feeling better. Patient was changed from [place] to [facility] . He does feel that it is helping. However, his insurance does not cover this. We discussed getting patient assistance paperwork started. He will also bring his formulary  to his next visit. He denies any chest pain, shortness breath, orthopnea, PND or leg swelling  Patient continues with pulmonary rehabilitation.  Patient did test positive for TB in March , remains on DOT therapy through Gilbert Jefferson.  Tolerating well.   Remains on oxygen 2 L. Says it helps him.Marland Kitchen He would like to have a lighter weight portable oxygen concentrator so he can be more active.  Allergies  Allergen Reactions  . Azithromycin     Wife noted memory loss when patient was on this chronically    Immunization History  Administered Date(s) Administered  . Influenza Split 01/10/2012  . Influenza,inj,Quad PF,36+ Mos 10/08/2012, 10/23/2013, 10/19/2014, 11/28/2015  . Influenza-Unspecified 11/11/2015  . Pneumococcal Conjugate-13  12/21/2013  . Pneumococcal Polysaccharide-23 01/09/2012  . Zoster 05/27/2015    Past Medical History:  Diagnosis Date  . COPD (chronic obstructive pulmonary disease) ([place])   . Hypertension   . Osteoarthrosis   . Varicose veins     Tobacco History: History  Smoking Status  . Former Smoker  . Packs/day: 1.50  . Years: 45.00  . Types: Cigarettes  . Quit date: 12/31/2011  Smokeless Tobacco  . Never Used   Counseling given: Not Answered   Outpatient Encounter Prescriptions as of 06/29/2016  Medication Sig  . albuterol (PROAIR HFA) 108 (90 Base) MCG/ACT inhaler Inhale 1-2 puffs into the lungs every 6 (six) hours as needed for shortness of breath.  Marland Kitchen albuterol (PROVENTIL) (2.5 MG/3ML) 0.083% nebulizer solution Take 3 mLs (2.5 mg total) by nebulization every 6 (six) hours as needed for wheezing or shortness of breath.  . busPIRone (BUSPAR) 15 MG tablet Take 7.5 mg by mouth 2 (two) times daily.  . Fluticasone-Umeclidin-Vilant (TRELEGY ELLIPTA) 100-62.5-25 MCG/INH AEPB Inhale 1 puff into the lungs daily.  . OXYGEN Oxygen 2.5 lpm with sleep and as needed during the day, 3lpm with exertion  . predniSONE (DELTASONE) 20 MG tablet Take 1 tablet (20 mg total) by mouth daily with breakfast.  . Respiratory Therapy Supplies (FLUTTER) DEVI 1 Device by Does not apply route as needed.  . tamsulosin (FLOMAX) 0.4 MG CAPS capsule Take 0.4 mg by mouth daily.  . [DISCONTINUED] Fluticasone-Umeclidin-Vilant (TRELEGY ELLIPTA) 100-62.5-25 MCG/INH AEPB Inhale 1 puff into the lungs daily.  . [DISCONTINUED] Fluticasone-Umeclidin-Vilant (TRELEGY ELLIPTA) 100-62.5-25 MCG/INH  AEPB Inhale 1 puff into the lungs daily.  . [DISCONTINUED] doxycycline (VIBRA-TABS) 100 MG tablet Take 2 tabs today, then 1 daily until gone (Patient not taking: Reported on 06/29/2016)  . [DISCONTINUED] doxycycline (VIBRA-TABS) 100 MG tablet Take 1 tablet (100 mg total) by mouth 2 (two) times daily. (Patient not taking: Reported on 06/29/2016)  .  [DISCONTINUED] levalbuterol (XOPENEX) nebulizer solution 0.63 mg    No facility-administered encounter medications on file as of 06/29/2016.      Review of Systems  Constitutional:   No  weight loss, night sweats,  Fevers, chills, fatigue, or  lassitude.  HEENT:   No headaches,  Difficulty swallowing,  Tooth/dental problems, or  Sore throat,                No sneezing, itching, ear ache, nasal congestion, post nasal drip,   CV:  No chest pain,  Orthopnea, PND, swelling in lower extremities, anasarca, dizziness, palpitations, syncope.   GI  No heartburn, indigestion, abdominal pain, nausea, vomiting, diarrhea, change in bowel habits, loss of appetite, bloody stools.   Resp:  .  No chest wall deformity  Skin: no rash or lesions.  GU: no dysuria, change in color of urine, no urgency or frequency.  No flank pain, no hematuria   MS:  No joint pain or swelling.  No decreased range of motion.  No back pain.    Physical Exam  BP 128/64 (BP Location: Left Arm, Cuff Size: Normal)   Pulse 88   Ht 5\' 7"  (1.702 m)   Wt 197 lb 9.6 oz (89.6 kg)   SpO2 94%   BMI 30.95 kg/m   GEN: A/Ox3; pleasant , NAD, well nourished    HEENT:  Gilbert Jefferson/AT,  EACs-clear, TMs-wnl, NOSE-clear, THROAT-clear, no lesions, no postnasal drip or exudate noted.   NECK:  Supple w/ fair ROM; no JVD; normal carotid impulses w/o bruits; no thyromegaly or nodules palpated; no lymphadenopathy.    RESP  Clear  P & A; w/o, wheezes/ rales/ or rhonchi. no accessory muscle use, no dullness to percussion  CARD:  RRR, no m/r/g, no peripheral edema, pulses intact, no cyanosis or clubbing.  GI:   Soft & nt; nml bowel sounds; no organomegaly or masses detected.   Musco: Warm bil, no deformities or joint swelling noted.   Neuro: alert, no focal deficits noted.    Skin: Warm, no lesions or rashes    Lab Results:  CBC  ProBNP No results found for: PROBNP  Imaging: Dg Chest 2 View  Result Date: 06/04/2016 CLINICAL DATA:   Cough, chest congestion, and shortness of breath for the past week EXAM: CHEST  2 VIEW COMPARISON:  Chest x-ray of March 19, 2016 and May 07, 2016 FINDINGS: The lungs are mildly hyperinflated with hemidiaphragm flattening. The lung markings are coarse in the lingula and slightly more conspicuous than on the earlier study. The right lung is clear. The heart and pulmonary vascularity are normal. There is calcification in the wall of the aortic arch. There is no pleural effusion. The bony thorax is unremarkable. IMPRESSION: Chronic bronchitic changes, stable. Scarring and likely superimposed atelectasis or early infiltrate in the lingula. Followup PA and lateral chest X-ray is recommended in 3-4 weeks following trial of antibiotic therapy to ensure resolution and exclude underlying malignancy. Thoracic aortic atherosclerosis. Electronically Signed   By: David  Martinique M.D.   On: 06/04/2016 14:14     Assessment & Plan:   COPD (chronic obstructive pulmonary disease) Gold D Recent  flare +/- lingular PNA  Clinically improving  Check cxr today   Plan  Patient Instructions  Continue on TRELEGY 1 puff daily , rinse after use.  Call insurance company for drug formulary .  Patient assistance papers sent for TRELEGY .  Continue on Oxygen 2l/m .  Continue TB meds and follow up with Health Dept.  Chest xray today .  Follow up Dr. Lake Bells in 3 months and As needed       Chronic respiratory failure with hypoxia ([place]) Cont on O2  Check Innogen poc   Mycobacterium tuberculosis infection Cont on therapy , follow up with Health Dept. Per protocol.      Rexene Edison, NP 06/29/2016

## 2016-06-29 NOTE — Assessment & Plan Note (Signed)
Recent flare +/- lingular PNA  Clinically improving  Check cxr today   Plan  Patient Instructions  Continue on TRELEGY 1 puff daily , rinse after use.  Call insurance company for drug formulary .  Patient assistance papers sent for TRELEGY .  Continue on Oxygen 2l/m .  Continue TB meds and follow up with Health Dept.  Chest xray today .  Follow up Dr. Lake Bells in 3 months and As needed

## 2016-07-01 NOTE — Progress Notes (Signed)
Reviewed, agree 

## 2016-07-02 ENCOUNTER — Telehealth: Payer: Self-pay | Admitting: Pulmonary Disease

## 2016-07-02 ENCOUNTER — Encounter (HOSPITAL_COMMUNITY)
Admission: RE | Admit: 2016-07-02 | Discharge: 2016-07-02 | Disposition: A | Discharge status: Home / Self Care | Payer: Medicare Other | Source: Ambulatory Visit | Attending: Pulmonary Disease | Admitting: Pulmonary Disease

## 2016-07-02 DIAGNOSIS — J432 Centrilobular emphysema: Secondary | ICD-10-CM | POA: Diagnosis present

## 2016-07-02 NOTE — Telephone Encounter (Signed)
Left message for patient to call back for results.   Notes recorded by Melvenia Needles, NP on 06/29/2016 at 1:14 PM EDT Chronic changes  No sign of PNA  Cont w/ ov recs. Please contact office for sooner follow up if symptoms do not improve or worsen or seek emergency care

## 2016-07-03 ENCOUNTER — Telehealth: Payer: Self-pay | Admitting: Pulmonary Disease

## 2016-07-03 NOTE — Telephone Encounter (Signed)
Spoke with pt, following up on the status of his POC.  I advised that his DME company confirmed receiving order late Friday afternoon, and that they should be in contact with him to follow up on this order.  Pt expressed understanding.  Nothing further needed.

## 2016-07-03 NOTE — Telephone Encounter (Signed)
Spoke with patient about CXR results. Patient verbalized understanding. Had no further questions at time of call.

## 2016-07-03 NOTE — Telephone Encounter (Signed)
Pt contact # [phone]...ert

## 2016-07-03 NOTE — Telephone Encounter (Signed)
Pt returned phone call..ert ° ° °

## 2016-07-04 ENCOUNTER — Encounter (HOSPITAL_COMMUNITY)
Admission: RE | Admit: 2016-07-04 | Discharge: 2016-07-04 | Disposition: A | Discharge status: Home / Self Care | Payer: Medicare Other | Source: Ambulatory Visit | Attending: Pulmonary Disease | Admitting: Pulmonary Disease

## 2016-07-06 ENCOUNTER — Encounter (HOSPITAL_COMMUNITY): Payer: Medicare Other

## 2016-07-09 ENCOUNTER — Encounter (HOSPITAL_COMMUNITY): Payer: Medicare Other

## 2016-07-11 ENCOUNTER — Encounter (HOSPITAL_COMMUNITY): Payer: Medicare Other

## 2016-07-13 ENCOUNTER — Encounter (HOSPITAL_COMMUNITY): Payer: Medicare Other

## 2016-07-16 ENCOUNTER — Encounter (HOSPITAL_COMMUNITY)
Admission: RE | Admit: 2016-07-16 | Discharge: 2016-07-16 | Disposition: A | Discharge status: Home / Self Care | Payer: Medicare Other | Source: Ambulatory Visit | Attending: Pulmonary Disease | Admitting: Pulmonary Disease

## 2016-07-18 ENCOUNTER — Encounter (HOSPITAL_COMMUNITY)
Admission: RE | Admit: 2016-07-18 | Discharge: 2016-07-18 | Disposition: A | Discharge status: Home / Self Care | Payer: Medicare Other | Source: Ambulatory Visit | Attending: Pulmonary Disease | Admitting: Pulmonary Disease

## 2016-07-20 ENCOUNTER — Encounter (HOSPITAL_COMMUNITY)
Admission: RE | Admit: 2016-07-20 | Discharge: 2016-07-20 | Disposition: A | Discharge status: Home / Self Care | Payer: Medicare Other | Source: Ambulatory Visit | Attending: Pulmonary Disease | Admitting: Pulmonary Disease

## 2016-07-23 ENCOUNTER — Encounter (HOSPITAL_COMMUNITY)
Admission: RE | Admit: 2016-07-23 | Discharge: 2016-07-23 | Disposition: A | Discharge status: Home / Self Care | Payer: Medicare Other | Source: Ambulatory Visit | Attending: Pulmonary Disease | Admitting: Pulmonary Disease

## 2016-07-24 ENCOUNTER — Ambulatory Visit: Payer: Medicare Other | Admitting: Pulmonary Disease

## 2016-07-25 ENCOUNTER — Encounter (HOSPITAL_COMMUNITY)
Admission: RE | Admit: 2016-07-25 | Discharge: 2016-07-25 | Disposition: A | Discharge status: Home / Self Care | Payer: Medicare Other | Source: Ambulatory Visit | Attending: Pulmonary Disease | Admitting: Pulmonary Disease

## 2016-07-27 ENCOUNTER — Encounter (HOSPITAL_COMMUNITY): Payer: Medicare Other

## 2016-07-30 ENCOUNTER — Encounter (HOSPITAL_COMMUNITY): Payer: Medicare Other

## 2016-07-30 DIAGNOSIS — J432 Centrilobular emphysema: Secondary | ICD-10-CM | POA: Diagnosis present

## 2016-08-01 ENCOUNTER — Encounter (HOSPITAL_COMMUNITY): Payer: Medicare Other

## 2016-08-03 ENCOUNTER — Encounter (HOSPITAL_COMMUNITY): Payer: Medicare Other

## 2016-08-06 ENCOUNTER — Telehealth: Payer: Self-pay | Admitting: Pulmonary Disease

## 2016-08-06 ENCOUNTER — Encounter (HOSPITAL_COMMUNITY)
Admission: RE | Admit: 2016-08-06 | Discharge: 2016-08-06 | Disposition: A | Discharge status: Home / Self Care | Payer: Medicare Other | Source: Ambulatory Visit | Attending: Pulmonary Disease | Admitting: Pulmonary Disease

## 2016-08-06 NOTE — Telephone Encounter (Signed)
ATC to call Jamas Lav with [facility]- received recording stating my party can not be reached. Will hold message in triage until 08/07/16 to f/u on.

## 2016-08-07 NOTE — Telephone Encounter (Signed)
Left a message for Marlene to call back.

## 2016-08-07 NOTE — Telephone Encounter (Signed)
Spoke with Gilbert Jefferson, states that she faxed a CXR request to the main fax yesterday afternoon. Aware that this has not been received and she is going to re-fax this now to triage. Will hold for request.

## 2016-08-07 NOTE — Telephone Encounter (Signed)
Medical Release received. Most recent cxr sent to [facility].  ATTNJamas Lav, aware that this is being faxed. Nothing further needed.

## 2016-08-07 NOTE — Telephone Encounter (Signed)
Jamas Lav returning call - she can be reached at [phone] -pr

## 2016-08-08 ENCOUNTER — Encounter (HOSPITAL_COMMUNITY)
Admission: RE | Admit: 2016-08-08 | Discharge: 2016-08-08 | Disposition: A | Discharge status: Home / Self Care | Payer: Medicare Other | Source: Ambulatory Visit | Attending: Pulmonary Disease | Admitting: Pulmonary Disease

## 2016-08-08 ENCOUNTER — Telehealth: Payer: Self-pay | Admitting: Pulmonary Disease

## 2016-08-08 NOTE — Telephone Encounter (Signed)
Pt wanted phone number to [facility] lab partners. Gave patient local number and nothing more needed at this time.

## 2016-08-10 ENCOUNTER — Encounter (HOSPITAL_COMMUNITY)
Admission: RE | Admit: 2016-08-10 | Discharge: 2016-08-10 | Disposition: A | Discharge status: Home / Self Care | Payer: Medicare Other | Source: Ambulatory Visit | Attending: Pulmonary Disease | Admitting: Pulmonary Disease

## 2016-08-15 ENCOUNTER — Encounter (HOSPITAL_COMMUNITY)
Admission: RE | Admit: 2016-08-15 | Discharge: 2016-08-15 | Disposition: A | Discharge status: Home / Self Care | Payer: Medicare Other | Source: Ambulatory Visit | Attending: Pulmonary Disease | Admitting: Pulmonary Disease

## 2016-08-17 ENCOUNTER — Encounter (HOSPITAL_COMMUNITY)
Admission: RE | Admit: 2016-08-17 | Discharge: 2016-08-17 | Disposition: A | Discharge status: Home / Self Care | Payer: Medicare Other | Source: Ambulatory Visit | Attending: Pulmonary Disease | Admitting: Pulmonary Disease

## 2016-08-20 ENCOUNTER — Encounter (HOSPITAL_COMMUNITY)
Admission: RE | Admit: 2016-08-20 | Discharge: 2016-08-20 | Disposition: A | Discharge status: Home / Self Care | Payer: Medicare Other | Source: Ambulatory Visit | Attending: Pulmonary Disease | Admitting: Pulmonary Disease

## 2016-08-22 ENCOUNTER — Encounter (HOSPITAL_COMMUNITY)
Admission: RE | Admit: 2016-08-22 | Discharge: 2016-08-22 | Disposition: A | Discharge status: Home / Self Care | Payer: Medicare Other | Source: Ambulatory Visit | Attending: Pulmonary Disease | Admitting: Pulmonary Disease

## 2016-09-03 ENCOUNTER — Encounter (HOSPITAL_COMMUNITY)
Admission: RE | Admit: 2016-09-03 | Discharge: 2016-09-03 | Disposition: A | Discharge status: Home / Self Care | Payer: Medicare Other | Source: Ambulatory Visit | Attending: Internal Medicine | Admitting: Internal Medicine

## 2016-09-03 DIAGNOSIS — J432 Centrilobular emphysema: Secondary | ICD-10-CM | POA: Diagnosis present

## 2016-09-05 ENCOUNTER — Encounter (HOSPITAL_COMMUNITY): Payer: Medicare Other

## 2016-09-07 ENCOUNTER — Encounter (HOSPITAL_COMMUNITY): Payer: Medicare Other

## 2016-09-10 ENCOUNTER — Telehealth: Payer: Self-pay | Admitting: Pulmonary Disease

## 2016-09-10 ENCOUNTER — Encounter (HOSPITAL_COMMUNITY)
Admission: RE | Admit: 2016-09-10 | Discharge: 2016-09-10 | Disposition: A | Discharge status: Home / Self Care | Payer: Medicare Other | Source: Ambulatory Visit | Attending: Internal Medicine | Admitting: Internal Medicine

## 2016-09-11 NOTE — Telephone Encounter (Signed)
Spoke with patient. He stated that he woke up yesterday morning with some chest congestion. He has a non-productive cough. Slight increase in SOB. Noticed the congestion yesterday morning after waking up. Denies any fever, chills, or body aches.   Patient wishes to use [place].   TP, please advise since BQ is not in the office today. Thanks!

## 2016-09-11 NOTE — Telephone Encounter (Signed)
LMTCB

## 2016-09-11 NOTE — Telephone Encounter (Signed)
Mucinex DM Twice daily  .As needed  Cough/congestion  Saline nasal rinses As needed   Please contact office for sooner follow up if symptoms do not improve or worsen or seek emergency care

## 2016-09-12 ENCOUNTER — Encounter (HOSPITAL_COMMUNITY): Payer: Medicare Other

## 2016-09-13 NOTE — Telephone Encounter (Signed)
[phone] pt calling back

## 2016-09-13 NOTE — Telephone Encounter (Signed)
Called and spoke to pt. Informed him of the recs per TP. Pt verbalized understanding and denied any further questions or concerns at this time.   

## 2016-09-13 NOTE — Telephone Encounter (Signed)
[phone] pt calling back

## 2016-09-13 NOTE — Telephone Encounter (Signed)
LMOMTCB x 1 

## 2016-09-13 NOTE — Telephone Encounter (Signed)
lmtcb for pt.  

## 2016-09-14 ENCOUNTER — Encounter (HOSPITAL_COMMUNITY)
Admission: RE | Admit: 2016-09-14 | Discharge: 2016-09-14 | Disposition: A | Discharge status: Home / Self Care | Payer: Medicare Other | Source: Ambulatory Visit | Attending: Internal Medicine | Admitting: Internal Medicine

## 2016-09-17 ENCOUNTER — Encounter (HOSPITAL_COMMUNITY)
Admission: RE | Admit: 2016-09-17 | Discharge: 2016-09-17 | Disposition: A | Discharge status: Home / Self Care | Payer: Medicare Other | Source: Ambulatory Visit | Attending: Internal Medicine | Admitting: Internal Medicine

## 2016-09-19 ENCOUNTER — Telehealth: Payer: Self-pay | Admitting: Adult Health

## 2016-09-19 ENCOUNTER — Encounter (HOSPITAL_COMMUNITY)
Admission: RE | Admit: 2016-09-19 | Discharge: 2016-09-19 | Disposition: A | Discharge status: Home / Self Care | Payer: Medicare Other | Source: Ambulatory Visit | Attending: Internal Medicine | Admitting: Internal Medicine

## 2016-09-19 NOTE — Telephone Encounter (Signed)
Spoke to [place] at [facility].  She states pt already has a poc - oxygo fit.  She is going to check with poc dept & see it it can be swapped out for Inogen 4.  She will call the pt & let him know what she finds out & will let us know if we need to change our order.  I called the pt & left him a vm to let him know Jeani Hawking at [place] is checking on getting poc swapped out.  Nothing further needed.

## 2016-09-19 NOTE — Telephone Encounter (Signed)
Pt had a POC ordered by our office on 06/29/2016- pt was established with [facility] (where order was sent to), pt still has not received POC.  Pt called [facility], was told that since [employer] has bought out [facility] that order will need to be refaxed.    PCC's please advise if order on file can be resent for pt's POC.  He still has not received the POC ordered on 6/1.  Thanks.

## 2016-09-21 ENCOUNTER — Encounter (HOSPITAL_COMMUNITY)
Admission: RE | Admit: 2016-09-21 | Discharge: 2016-09-21 | Disposition: A | Discharge status: Home / Self Care | Payer: Medicare Other | Source: Ambulatory Visit | Attending: Internal Medicine | Admitting: Internal Medicine

## 2016-09-24 ENCOUNTER — Encounter (HOSPITAL_COMMUNITY)
Admission: RE | Admit: 2016-09-24 | Discharge: 2016-09-24 | Disposition: A | Discharge status: Home / Self Care | Payer: Medicare Other | Source: Ambulatory Visit | Attending: Internal Medicine | Admitting: Internal Medicine

## 2016-09-26 ENCOUNTER — Encounter (HOSPITAL_COMMUNITY)
Admission: RE | Admit: 2016-09-26 | Discharge: 2016-09-26 | Disposition: A | Discharge status: Home / Self Care | Payer: Medicare Other | Source: Ambulatory Visit | Attending: Internal Medicine | Admitting: Internal Medicine

## 2016-09-28 ENCOUNTER — Encounter (HOSPITAL_COMMUNITY): Payer: Medicare Other

## 2016-10-03 ENCOUNTER — Encounter (HOSPITAL_COMMUNITY)
Admission: RE | Admit: 2016-10-03 | Discharge: 2016-10-03 | Disposition: A | Discharge status: Home / Self Care | Payer: Self-pay | Source: Ambulatory Visit | Attending: Internal Medicine | Admitting: Internal Medicine

## 2016-10-03 DIAGNOSIS — J432 Centrilobular emphysema: Secondary | ICD-10-CM | POA: Insufficient documentation

## 2016-10-05 ENCOUNTER — Encounter (HOSPITAL_COMMUNITY)
Admission: RE | Admit: 2016-10-05 | Discharge: 2016-10-05 | Disposition: A | Discharge status: Home / Self Care | Payer: Self-pay | Source: Ambulatory Visit | Attending: Internal Medicine | Admitting: Internal Medicine

## 2016-10-08 ENCOUNTER — Encounter (HOSPITAL_COMMUNITY)
Admission: RE | Admit: 2016-10-08 | Discharge: 2016-10-08 | Disposition: A | Discharge status: Home / Self Care | Payer: Self-pay | Source: Ambulatory Visit | Attending: Internal Medicine | Admitting: Internal Medicine

## 2016-10-10 ENCOUNTER — Encounter (HOSPITAL_COMMUNITY)
Admission: RE | Admit: 2016-10-10 | Discharge: 2016-10-10 | Disposition: A | Discharge status: Home / Self Care | Payer: Self-pay | Source: Ambulatory Visit | Attending: Internal Medicine | Admitting: Internal Medicine

## 2016-10-12 ENCOUNTER — Encounter (HOSPITAL_COMMUNITY): Payer: Self-pay

## 2016-10-15 ENCOUNTER — Encounter (HOSPITAL_COMMUNITY)
Admission: RE | Admit: 2016-10-15 | Discharge: 2016-10-15 | Disposition: A | Discharge status: Home / Self Care | Payer: Self-pay | Source: Ambulatory Visit | Attending: Internal Medicine | Admitting: Internal Medicine

## 2016-10-17 ENCOUNTER — Encounter (HOSPITAL_COMMUNITY)
Admission: RE | Admit: 2016-10-17 | Discharge: 2016-10-17 | Disposition: A | Discharge status: Home / Self Care | Payer: Self-pay | Source: Ambulatory Visit | Attending: Internal Medicine | Admitting: Internal Medicine

## 2016-10-19 ENCOUNTER — Encounter (HOSPITAL_COMMUNITY): Payer: Self-pay

## 2016-10-19 DIAGNOSIS — R972 Elevated prostate specific antigen [PSA]: Secondary | ICD-10-CM | POA: Diagnosis not present

## 2016-10-22 ENCOUNTER — Encounter (HOSPITAL_COMMUNITY)
Admission: RE | Admit: 2016-10-22 | Discharge: 2016-10-22 | Disposition: A | Discharge status: Home / Self Care | Payer: Self-pay | Source: Ambulatory Visit | Attending: Internal Medicine | Admitting: Internal Medicine

## 2016-10-23 ENCOUNTER — Other Ambulatory Visit (INDEPENDENT_AMBULATORY_CARE_PROVIDER_SITE_OTHER): Payer: Medicare Other

## 2016-10-23 ENCOUNTER — Encounter: Payer: Self-pay | Admitting: Pulmonary Disease

## 2016-10-23 ENCOUNTER — Ambulatory Visit (INDEPENDENT_AMBULATORY_CARE_PROVIDER_SITE_OTHER): Payer: Medicare Other | Admitting: Pulmonary Disease

## 2016-10-23 VITALS — BP 140/76 | HR 74 | Ht 67.0 in | Wt 202.0 lb

## 2016-10-23 DIAGNOSIS — R5383 Other fatigue: Secondary | ICD-10-CM

## 2016-10-23 DIAGNOSIS — Z23 Encounter for immunization: Secondary | ICD-10-CM | POA: Diagnosis not present

## 2016-10-23 DIAGNOSIS — A159 Respiratory tuberculosis unspecified: Secondary | ICD-10-CM

## 2016-10-23 DIAGNOSIS — J42 Unspecified chronic bronchitis: Secondary | ICD-10-CM

## 2016-10-23 DIAGNOSIS — J449 Chronic obstructive pulmonary disease, unspecified: Secondary | ICD-10-CM | POA: Diagnosis not present

## 2016-10-23 DIAGNOSIS — J9611 Chronic respiratory failure with hypoxia: Secondary | ICD-10-CM

## 2016-10-23 LAB — CBC WITH DIFFERENTIAL/PLATELET
BASOS PCT: 0.4 % (ref 0.0–3.0)
Basophils Absolute: 0 10*3/uL (ref 0.0–0.1)
EOS PCT: 1.6 % (ref 0.0–5.0)
Eosinophils Absolute: 0.1 10*3/uL (ref 0.0–0.7)
HEMATOCRIT: 46.8 % (ref 39.0–52.0)
HEMOGLOBIN: 15.7 g/dL (ref 13.0–17.0)
LYMPHS PCT: 16.7 % (ref 12.0–46.0)
Lymphs Abs: 1.1 10*3/uL (ref 0.7–4.0)
MCHC: 33.5 g/dL (ref 30.0–36.0)
MCV: 88.8 fl (ref 78.0–100.0)
Monocytes Absolute: 0.6 10*3/uL (ref 0.1–1.0)
Monocytes Relative: 9.5 % (ref 3.0–12.0)
Neutro Abs: 4.8 10*3/uL (ref 1.4–7.7)
Neutrophils Relative %: 71.8 % (ref 43.0–77.0)
Platelets: 268 10*3/uL (ref 150.0–400.0)
RBC: 5.27 Mil/uL (ref 4.22–5.81)
RDW: 14.2 % (ref 11.5–15.5)
WBC: 6.7 10*3/uL (ref 4.0–10.5)

## 2016-10-23 LAB — TSH: TSH: 2.64 u[IU]/mL (ref 0.35–4.50)

## 2016-10-23 MED ORDER — FLUTICASONE-UMECLIDIN-VILANT 100-62.5-25 MCG/INH IN AEPB: 1.0000 | INHALATION_SPRAY | Freq: Every day | RESPIRATORY_TRACT | 0 refills | Status: DC

## 2016-10-23 NOTE — Progress Notes (Signed)
Subjective:    Patient ID: Gilbert Jefferson, male    DOB: Aug 04, 1950, 66 y.o.   MRN: [id number]  Synopsis: Former patient of Dr. Joya Gaskins with COPD and diagnosed with TB in April 2018 Copd gold D  01/18/2012 ONO RA normal Arlyce Harman 10/08/12: FeV1 29% FeV1/FVC 41% Alpha one antitrypsin 04/10/2013>>normal 145 Spiro 04/10/2013>> FeV1 26% FVC 56% FeV1/FVC 53% patient COPD    HPI Chief Complaint  Patient presents with  . Follow-up    pt states he is at baseline sob with exertion.  denies any new or acute complaints.     Elis has been doing well.  He continues to take his TB treatment and will finish this next month.  He still doesn't know how on earth he got TB. Pulmonary has been going well.  He says that his insurance company. He says that his energy level is much lower than it used to be. He is frustrated with the weight of his portable oxygen concentrator.  We ordered a lighter one several months ago but [employer] hasn't followed through.   Continues to use and benefit from his oxygen level on a regularly basis.   He says that he may have been diagnosed with prostate cancer, he is currently undergoing evaluation for this right now.     Past Medical History:  Diagnosis Date  . COPD (chronic obstructive pulmonary disease) ([place])   . Hypertension   . Osteoarthrosis   . Varicose veins       Review of Systems  Constitutional: Positive for fatigue. Negative for chills and fever.  HENT: Negative for rhinorrhea and sinus pressure.   Respiratory: Positive for cough and shortness of breath. Negative for wheezing.   Cardiovascular: Negative for chest pain, palpitations and leg swelling.       Objective:   Physical Exam Vitals:   10/23/16 0912  BP: 140/76  Pulse: 74  SpO2: 93%  Weight: 202 lb (91.6 kg)  Height: 5\' 7"  (1.702 m)   2L  Gen: well appearing, on O2 HENT: OP clear, TM's clear, neck supple PULM: Poor air movement B, normal percussion CV: RRR, no mgr, trace edema GI: BS+, soft,  nontender Derm: no cyanosis or rash Psyche: normal mood and affect   Last records from his visit with our pulmonary NP, he was maintained on Trelegy and he continues TB treatment through the health department    CBC    Component Value Date/Time   WBC 7.0 03/24/2015 1624   RBC 4.64 03/24/2015 1624   HGB 13.8 03/24/2015 1624   HCT 40.9 03/24/2015 1624   PLT 254.0 03/24/2015 1624   MCV 88.1 03/24/2015 1624   MCH 31.6 04/28/2010 1621   MCHC 33.8 03/24/2015 1624   RDW 12.7 03/24/2015 1624   LYMPHSABS 1.1 03/24/2015 1624   MONOABS 1.2 (H) 03/24/2015 1624   EOSABS 0.1 03/24/2015 1624   BASOSABS 0.0 03/24/2015 1624    BMET    Component Value Date/Time   NA 140 04/28/2010 1644   K 5.2 (H) 04/28/2010 1644   CL 105 04/28/2010 1644   GLUCOSE 80 04/28/2010 1644   BUN 17 04/28/2010 1644   CREATININE 1.0 04/28/2010 1644          Assessment & Plan:   Chronic respiratory failure with hypoxia ([place]) - Plan: Ambulatory Referral for DME  Other fatigue - Plan: CBC w/Diff, TSH  Chronic bronchitis, unspecified chronic bronchitis type ([place])  Mycobacterium tuberculosis infection  Chronic obstructive pulmonary disease, unspecified COPD type ([place])  Discussion: Fernandez has been doing well from a COPD and tuberculosis standpoint. He is about to complete treatment for tuberculosis next month and continues to show no signs or symptoms of this. He notes that his energy level has been dropping recently. Apparently his PSA test has increased and he is undergoing a prostate cancer evaluation right now. I do not have a recent thyroid or hemoglobin value on him so we need to get those as this could explain his fatigue.  Today we spent a significant amount of time talking about lung transplant. I think that if his energy level continues to remain low and he is successfully treated for tuberculosis and he does not have prostate cancer that it may be reasonable to consider an evaluation by the [facility]  lung transplant team next year. I explained to him that I think he is an ideal candidate in terms of his willingness to participate in rehabilitation and his compliance with his medical therapy, but his continued reasonable functional status makes me think that it may not be time for him to have a transplant yet. However, his energy level continues to stay low and we have no other reason other than his severe COPD and this may be a good reason to send him for evaluation. Barriers to transplant would obviously be the recent treatment for Mycobacterium tuberculosis and whether or not he has prostate cancer.  Plan: For chronic respiratory failure with hypoxemia: Continue using oxygen at 2Lpm with rest and 3L with exertion  COPD: Continue using Trelegy daily Flu shot today Continue participating in pulmonary rehab  For TB: Continue medication treatment with the health department  For lack of energy/fatigue: Check blood count (CBC test) and thyroid test today (TSH)  We will see you back in 4 months or sooner if needed  > 50% of this 30 minute visit spent face to face    Current Outpatient Prescriptions:  .  albuterol (PROAIR HFA) 108 (90 Base) MCG/ACT inhaler, Inhale 1-2 puffs into the lungs every 6 (six) hours as needed for shortness of breath., Disp: 3 Inhaler, Rfl: 3 .  albuterol (PROVENTIL) (2.5 MG/3ML) 0.083% nebulizer solution, Take 3 mLs (2.5 mg total) by nebulization every 6 (six) hours as needed for wheezing or shortness of breath., Disp: 360 mL, Rfl: 11 .  busPIRone (BUSPAR) 15 MG tablet, Take 7.5 mg by mouth 2 (two) times daily., Disp: , Rfl:  .  Fluticasone-Umeclidin-Vilant (TRELEGY ELLIPTA) 100-62.5-25 MCG/INH AEPB, Inhale 1 puff into the lungs daily., Disp: 3 each, Rfl: 4 .  OXYGEN, Oxygen 2.5 lpm with sleep and as needed during the day, 3lpm with exertion, Disp: , Rfl:  .  Respiratory Therapy Supplies (FLUTTER) DEVI, 1 Device by Does not apply route as needed., Disp: 1 each,  Rfl: 0 .  tamsulosin (FLOMAX) 0.4 MG CAPS capsule, Take 0.4 mg by mouth daily., Disp: , Rfl:

## 2016-10-23 NOTE — Patient Instructions (Addendum)
For chronic respiratory failure with hypoxemia: Continue using oxygen at 2Lpm with rest and 3L with exertion  COPD: Continue using Trelegy daily Flu shot today Continue participating in pulmonary rehab  For TB: Continue medication treatment with the health department  For lack of energy/fatigue: Check blood count (CBC test) and thyroid test today (TSH)  We will see you back in 4 months or sooner if needed

## 2016-10-24 ENCOUNTER — Encounter (HOSPITAL_COMMUNITY)
Admission: RE | Admit: 2016-10-24 | Discharge: 2016-10-24 | Disposition: A | Discharge status: Home / Self Care | Payer: Self-pay | Source: Ambulatory Visit | Attending: Internal Medicine | Admitting: Internal Medicine

## 2016-10-25 ENCOUNTER — Telehealth: Payer: Self-pay | Admitting: Pulmonary Disease

## 2016-10-25 DIAGNOSIS — R972 Elevated prostate specific antigen [PSA]: Secondary | ICD-10-CM | POA: Diagnosis not present

## 2016-10-25 DIAGNOSIS — N400 Enlarged prostate without lower urinary tract symptoms: Secondary | ICD-10-CM | POA: Diagnosis not present

## 2016-10-25 NOTE — Telephone Encounter (Signed)
Notes recorded by Juanito Doom, MD on 10/23/2016 at 3:38 PM EDT A, Please let the patient know this was OK Thanks,   Advised pt of results. Pt understood and nothing further is needed.

## 2016-10-25 NOTE — Telephone Encounter (Signed)
Pt can be reached @ [phone].Gilbert Jefferson

## 2016-10-26 ENCOUNTER — Encounter (HOSPITAL_COMMUNITY)
Admission: RE | Admit: 2016-10-26 | Discharge: 2016-10-26 | Disposition: A | Discharge status: Home / Self Care | Payer: Self-pay | Source: Ambulatory Visit | Attending: Internal Medicine | Admitting: Internal Medicine

## 2016-10-29 ENCOUNTER — Encounter (HOSPITAL_COMMUNITY)
Admission: RE | Admit: 2016-10-29 | Discharge: 2016-10-29 | Disposition: A | Discharge status: Home / Self Care | Payer: Self-pay | Source: Ambulatory Visit | Attending: Internal Medicine | Admitting: Internal Medicine

## 2016-10-29 DIAGNOSIS — J432 Centrilobular emphysema: Secondary | ICD-10-CM | POA: Insufficient documentation

## 2016-10-31 ENCOUNTER — Encounter (HOSPITAL_COMMUNITY)
Admission: RE | Admit: 2016-10-31 | Discharge: 2016-10-31 | Disposition: A | Discharge status: Home / Self Care | Payer: Self-pay | Source: Ambulatory Visit | Attending: Internal Medicine | Admitting: Internal Medicine

## 2016-11-02 ENCOUNTER — Encounter (HOSPITAL_COMMUNITY): Payer: Self-pay

## 2016-11-05 ENCOUNTER — Encounter (HOSPITAL_COMMUNITY)
Admission: RE | Admit: 2016-11-05 | Discharge: 2016-11-05 | Disposition: A | Discharge status: Home / Self Care | Payer: Self-pay | Source: Ambulatory Visit | Attending: Internal Medicine | Admitting: Internal Medicine

## 2016-11-07 ENCOUNTER — Encounter (HOSPITAL_COMMUNITY)
Admission: RE | Admit: 2016-11-07 | Discharge: 2016-11-07 | Disposition: A | Discharge status: Home / Self Care | Payer: Self-pay | Source: Ambulatory Visit | Attending: Internal Medicine | Admitting: Internal Medicine

## 2016-11-09 ENCOUNTER — Encounter (HOSPITAL_COMMUNITY)
Admission: RE | Admit: 2016-11-09 | Discharge: 2016-11-09 | Disposition: A | Discharge status: Home / Self Care | Payer: Self-pay | Source: Ambulatory Visit | Attending: Internal Medicine | Admitting: Internal Medicine

## 2016-11-12 ENCOUNTER — Encounter (HOSPITAL_COMMUNITY): Payer: Self-pay

## 2016-11-14 ENCOUNTER — Encounter (HOSPITAL_COMMUNITY)
Admission: RE | Admit: 2016-11-14 | Discharge: 2016-11-14 | Disposition: A | Discharge status: Home / Self Care | Payer: Self-pay | Source: Ambulatory Visit | Attending: Internal Medicine | Admitting: Internal Medicine

## 2016-11-15 ENCOUNTER — Telehealth: Payer: Self-pay | Admitting: Pulmonary Disease

## 2016-11-15 NOTE — Telephone Encounter (Signed)
Yes, I agree I've signed the form, it can be faxed back

## 2016-11-15 NOTE — Telephone Encounter (Signed)
Called and spoke with Gilbert Jefferson and she stated that the forms that were faxed over on this pt will just need to be documented by BQ that he agrees, and the date and signature and have this faxed back to them since the pts last day of treatment is tomorrow.  Will forward to [facility] and BQ to make them aware.

## 2016-11-15 NOTE — Telephone Encounter (Signed)
Form completed by BQ and faxed back to health dept.   atc April to make aware, no answer and no vm.  Wcb.

## 2016-11-16 ENCOUNTER — Encounter (HOSPITAL_COMMUNITY)
Admission: RE | Admit: 2016-11-16 | Discharge: 2016-11-16 | Disposition: A | Discharge status: Home / Self Care | Payer: Medicare Other | Source: Ambulatory Visit | Attending: Internal Medicine | Admitting: Internal Medicine

## 2016-11-16 NOTE — Telephone Encounter (Signed)
Called and spoke with April and she stated that they did not receive the fax that was sent in to them yesterday on this pt.  [facility]--- the fax number is [phone] to the health dept.  I looked in BQ folders but did not see this form.

## 2016-11-16 NOTE — Telephone Encounter (Signed)
Called and was advised that this form did come through tthe fax. Nothing further is needed.

## 2016-11-16 NOTE — Telephone Encounter (Signed)
Form has been refaxed to below fax #.

## 2016-11-19 ENCOUNTER — Encounter (HOSPITAL_COMMUNITY)
Admission: RE | Admit: 2016-11-19 | Discharge: 2016-11-19 | Disposition: A | Discharge status: Home / Self Care | Payer: Self-pay | Source: Ambulatory Visit | Attending: Internal Medicine | Admitting: Internal Medicine

## 2016-11-21 ENCOUNTER — Encounter (HOSPITAL_COMMUNITY)
Admission: RE | Admit: 2016-11-21 | Discharge: 2016-11-21 | Disposition: A | Discharge status: Home / Self Care | Payer: Self-pay | Source: Ambulatory Visit | Attending: Internal Medicine | Admitting: Internal Medicine

## 2016-11-23 ENCOUNTER — Encounter (HOSPITAL_COMMUNITY)
Admission: RE | Admit: 2016-11-23 | Discharge: 2016-11-23 | Disposition: A | Discharge status: Home / Self Care | Payer: Self-pay | Source: Ambulatory Visit | Attending: Internal Medicine | Admitting: Internal Medicine

## 2016-11-26 ENCOUNTER — Encounter (HOSPITAL_COMMUNITY)
Admission: RE | Admit: 2016-11-26 | Discharge: 2016-11-26 | Disposition: A | Discharge status: Home / Self Care | Payer: Self-pay | Source: Ambulatory Visit | Attending: Internal Medicine | Admitting: Internal Medicine

## 2016-11-28 ENCOUNTER — Encounter (HOSPITAL_COMMUNITY)
Admission: RE | Admit: 2016-11-28 | Discharge: 2016-11-28 | Disposition: A | Discharge status: Home / Self Care | Payer: Self-pay | Source: Ambulatory Visit | Attending: Internal Medicine | Admitting: Internal Medicine

## 2016-11-30 ENCOUNTER — Encounter (HOSPITAL_COMMUNITY): Payer: Self-pay

## 2016-12-03 ENCOUNTER — Encounter (HOSPITAL_COMMUNITY)
Admission: RE | Admit: 2016-12-03 | Discharge: 2016-12-03 | Disposition: A | Discharge status: Home / Self Care | Payer: Self-pay | Source: Ambulatory Visit | Attending: Internal Medicine | Admitting: Internal Medicine

## 2016-12-03 DIAGNOSIS — J432 Centrilobular emphysema: Secondary | ICD-10-CM | POA: Insufficient documentation

## 2016-12-05 ENCOUNTER — Encounter (HOSPITAL_COMMUNITY): Payer: Self-pay

## 2016-12-07 ENCOUNTER — Encounter (HOSPITAL_COMMUNITY): Payer: Self-pay

## 2016-12-10 ENCOUNTER — Encounter (HOSPITAL_COMMUNITY): Payer: Self-pay

## 2016-12-12 ENCOUNTER — Encounter (HOSPITAL_COMMUNITY)
Admission: RE | Admit: 2016-12-12 | Discharge: 2016-12-12 | Disposition: A | Discharge status: Home / Self Care | Payer: Self-pay | Source: Ambulatory Visit | Attending: Internal Medicine | Admitting: Internal Medicine

## 2016-12-14 ENCOUNTER — Encounter (HOSPITAL_COMMUNITY)
Admission: RE | Admit: 2016-12-14 | Discharge: 2016-12-14 | Disposition: A | Discharge status: Home / Self Care | Payer: Medicare Other | Source: Ambulatory Visit | Attending: Internal Medicine | Admitting: Internal Medicine

## 2016-12-17 ENCOUNTER — Encounter (HOSPITAL_COMMUNITY)
Admission: RE | Admit: 2016-12-17 | Discharge: 2016-12-17 | Disposition: A | Discharge status: Home / Self Care | Payer: Self-pay | Source: Ambulatory Visit | Attending: Internal Medicine | Admitting: Internal Medicine

## 2016-12-19 ENCOUNTER — Encounter (HOSPITAL_COMMUNITY)
Admission: RE | Admit: 2016-12-19 | Discharge: 2016-12-19 | Disposition: A | Discharge status: Home / Self Care | Payer: Self-pay | Source: Ambulatory Visit | Attending: Internal Medicine | Admitting: Internal Medicine

## 2016-12-21 ENCOUNTER — Encounter (HOSPITAL_COMMUNITY): Payer: Self-pay

## 2016-12-24 ENCOUNTER — Encounter (HOSPITAL_COMMUNITY)
Admission: RE | Admit: 2016-12-24 | Discharge: 2016-12-24 | Disposition: A | Discharge status: Home / Self Care | Payer: Self-pay | Source: Ambulatory Visit | Attending: Internal Medicine | Admitting: Internal Medicine

## 2016-12-26 ENCOUNTER — Encounter (HOSPITAL_COMMUNITY)
Admission: RE | Admit: 2016-12-26 | Discharge: 2016-12-26 | Disposition: A | Discharge status: Home / Self Care | Payer: Self-pay | Source: Ambulatory Visit | Attending: Internal Medicine | Admitting: Internal Medicine

## 2016-12-28 ENCOUNTER — Encounter (HOSPITAL_COMMUNITY): Payer: Medicare Other

## 2016-12-28 ENCOUNTER — Encounter (HOSPITAL_COMMUNITY)
Admission: RE | Admit: 2016-12-28 | Discharge: 2016-12-28 | Disposition: A | Discharge status: Home / Self Care | Payer: Self-pay | Source: Ambulatory Visit | Attending: Internal Medicine | Admitting: Internal Medicine

## 2016-12-31 ENCOUNTER — Encounter (HOSPITAL_COMMUNITY): Payer: Medicare Other

## 2016-12-31 ENCOUNTER — Encounter (HOSPITAL_COMMUNITY): Payer: Self-pay

## 2016-12-31 DIAGNOSIS — J432 Centrilobular emphysema: Secondary | ICD-10-CM | POA: Insufficient documentation

## 2017-01-02 ENCOUNTER — Encounter (HOSPITAL_COMMUNITY)
Admission: RE | Admit: 2017-01-02 | Discharge: 2017-01-02 | Disposition: A | Discharge status: Home / Self Care | Payer: Self-pay | Source: Ambulatory Visit | Attending: Internal Medicine | Admitting: Internal Medicine

## 2017-01-02 ENCOUNTER — Encounter (HOSPITAL_COMMUNITY): Payer: Medicare Other

## 2017-01-04 ENCOUNTER — Encounter (HOSPITAL_COMMUNITY)
Admission: RE | Admit: 2017-01-04 | Discharge: 2017-01-04 | Disposition: A | Discharge status: Home / Self Care | Payer: Medicare Other | Source: Ambulatory Visit | Attending: Internal Medicine | Admitting: Internal Medicine

## 2017-01-04 ENCOUNTER — Encounter (HOSPITAL_COMMUNITY): Payer: Medicare Other

## 2017-01-07 ENCOUNTER — Encounter (HOSPITAL_COMMUNITY): Payer: Self-pay

## 2017-01-07 ENCOUNTER — Encounter (HOSPITAL_COMMUNITY): Payer: Medicare Other

## 2017-01-09 ENCOUNTER — Encounter (HOSPITAL_COMMUNITY): Payer: Medicare Other

## 2017-01-09 ENCOUNTER — Encounter (HOSPITAL_COMMUNITY)
Admission: RE | Admit: 2017-01-09 | Discharge: 2017-01-09 | Disposition: A | Discharge status: Home / Self Care | Payer: Self-pay | Source: Ambulatory Visit | Attending: Internal Medicine | Admitting: Internal Medicine

## 2017-01-11 ENCOUNTER — Encounter (HOSPITAL_COMMUNITY): Payer: Medicare Other

## 2017-01-11 ENCOUNTER — Encounter (HOSPITAL_COMMUNITY)
Admission: RE | Admit: 2017-01-11 | Discharge: 2017-01-11 | Disposition: A | Discharge status: Home / Self Care | Payer: Medicare Other | Source: Ambulatory Visit | Attending: Internal Medicine | Admitting: Internal Medicine

## 2017-01-14 ENCOUNTER — Encounter (HOSPITAL_COMMUNITY): Payer: Medicare Other

## 2017-01-14 ENCOUNTER — Encounter (HOSPITAL_COMMUNITY)
Admission: RE | Admit: 2017-01-14 | Discharge: 2017-01-14 | Disposition: A | Discharge status: Home / Self Care | Payer: Self-pay | Source: Ambulatory Visit | Attending: Internal Medicine | Admitting: Internal Medicine

## 2017-01-16 ENCOUNTER — Encounter (HOSPITAL_COMMUNITY): Payer: Medicare Other

## 2017-01-16 ENCOUNTER — Telehealth (HOSPITAL_COMMUNITY): Payer: Self-pay | Admitting: *Deleted

## 2017-01-16 ENCOUNTER — Encounter (HOSPITAL_COMMUNITY): Payer: Self-pay

## 2017-01-17 NOTE — Telephone Encounter (Signed)
Thanks

## 2017-01-18 ENCOUNTER — Encounter (HOSPITAL_COMMUNITY): Payer: Medicare Other

## 2017-01-18 ENCOUNTER — Encounter (HOSPITAL_COMMUNITY): Payer: Self-pay

## 2017-01-21 ENCOUNTER — Encounter (HOSPITAL_COMMUNITY): Payer: Medicare Other

## 2017-01-21 ENCOUNTER — Encounter (HOSPITAL_COMMUNITY): Payer: Self-pay

## 2017-01-23 ENCOUNTER — Encounter (HOSPITAL_COMMUNITY): Payer: Medicare Other

## 2017-01-25 ENCOUNTER — Encounter (HOSPITAL_COMMUNITY): Payer: Medicare Other

## 2017-01-28 ENCOUNTER — Encounter (HOSPITAL_COMMUNITY): Payer: Medicare Other

## 2017-02-04 ENCOUNTER — Encounter (HOSPITAL_COMMUNITY)
Admission: RE | Admit: 2017-02-04 | Discharge: 2017-02-04 | Disposition: A | Discharge status: Home / Self Care | Payer: Self-pay | Source: Ambulatory Visit | Attending: Internal Medicine | Admitting: Internal Medicine

## 2017-02-04 DIAGNOSIS — J432 Centrilobular emphysema: Secondary | ICD-10-CM | POA: Insufficient documentation

## 2017-02-06 ENCOUNTER — Encounter (HOSPITAL_COMMUNITY)
Admission: RE | Admit: 2017-02-06 | Discharge: 2017-02-06 | Disposition: A | Discharge status: Home / Self Care | Payer: Self-pay | Source: Ambulatory Visit | Attending: Internal Medicine | Admitting: Internal Medicine

## 2017-02-08 ENCOUNTER — Encounter (HOSPITAL_COMMUNITY)
Admission: RE | Admit: 2017-02-08 | Discharge: 2017-02-08 | Disposition: A | Discharge status: Home / Self Care | Payer: Self-pay | Source: Ambulatory Visit | Attending: Internal Medicine | Admitting: Internal Medicine

## 2017-02-14 DIAGNOSIS — M159 Polyosteoarthritis, unspecified: Secondary | ICD-10-CM | POA: Diagnosis not present

## 2017-02-14 DIAGNOSIS — R197 Diarrhea, unspecified: Secondary | ICD-10-CM | POA: Diagnosis not present

## 2017-02-14 DIAGNOSIS — J449 Chronic obstructive pulmonary disease, unspecified: Secondary | ICD-10-CM | POA: Diagnosis not present

## 2017-02-14 DIAGNOSIS — J9611 Chronic respiratory failure with hypoxia: Secondary | ICD-10-CM | POA: Diagnosis not present

## 2017-02-14 DIAGNOSIS — F4323 Adjustment disorder with mixed anxiety and depressed mood: Secondary | ICD-10-CM | POA: Diagnosis not present

## 2017-02-20 ENCOUNTER — Encounter (HOSPITAL_COMMUNITY)
Admission: RE | Admit: 2017-02-20 | Discharge: 2017-02-20 | Disposition: A | Discharge status: Home / Self Care | Payer: Self-pay | Source: Ambulatory Visit | Attending: Internal Medicine | Admitting: Internal Medicine

## 2017-02-21 ENCOUNTER — Ambulatory Visit (INDEPENDENT_AMBULATORY_CARE_PROVIDER_SITE_OTHER)
Admission: RE | Admit: 2017-02-21 | Discharge: 2017-02-21 | Disposition: A | Discharge status: Home / Self Care | Payer: Medicare Other | Source: Ambulatory Visit | Attending: Pulmonary Disease | Admitting: Pulmonary Disease

## 2017-02-21 ENCOUNTER — Encounter: Payer: Self-pay | Admitting: Pulmonary Disease

## 2017-02-21 ENCOUNTER — Ambulatory Visit (INDEPENDENT_AMBULATORY_CARE_PROVIDER_SITE_OTHER): Payer: Medicare Other | Admitting: Pulmonary Disease

## 2017-02-21 VITALS — BP 142/78 | HR 94 | Ht 67.0 in | Wt 214.0 lb

## 2017-02-21 DIAGNOSIS — J42 Unspecified chronic bronchitis: Secondary | ICD-10-CM

## 2017-02-21 DIAGNOSIS — J441 Chronic obstructive pulmonary disease with (acute) exacerbation: Secondary | ICD-10-CM

## 2017-02-21 DIAGNOSIS — R5383 Other fatigue: Secondary | ICD-10-CM

## 2017-02-21 DIAGNOSIS — I700 Atherosclerosis of aorta: Secondary | ICD-10-CM | POA: Diagnosis not present

## 2017-02-21 DIAGNOSIS — J9611 Chronic respiratory failure with hypoxia: Secondary | ICD-10-CM

## 2017-02-21 MED ORDER — DOXYCYCLINE HYCLATE 100 MG PO TABS: 100.0000 mg | ORAL_TABLET | Freq: Two times a day (BID) | ORAL | 0 refills | Status: DC

## 2017-02-21 NOTE — Patient Instructions (Signed)
Severe COPD with exacerbation: Take doxycycline 100 mg twice a day times 5 days Please provide [place] with a sample of your mucus as we will tested for bacteria, fungus, AFB Keep taking Trelegy daily Use albuterol as needed We will check a chest x-ray today to make sure there is nothing else going on  Chronic respiratory failure with hypoxemia: Continue 2 L of oxygen at rest, 3 L with exertion  Fatigue: We will check a sleep study We will repeat a pulmonary function test  We will see you back in 6-8 weeks or sooner if needed

## 2017-02-21 NOTE — Progress Notes (Signed)
Subjective:    Patient ID: Gilbert Jefferson, male    DOB: [birth year], 67 y.o.   MRN: [id number]  Synopsis: Former patient of Dr. Joya Jefferson with COPD and diagnosed with TB in April 2018; completed treatment 11/16/2016 Copd gold D  01/18/2012 ONO RA normal Spiro 10/08/12: FeV1 29% FeV1/FVC 41% Alpha one antitrypsin 04/10/2013>>normal 145 Spiro 04/10/2013>> FeV1 26% FVC 56% FeV1/FVC 53% patient COPD    HPI Chief Complaint  Patient presents with  . Follow-up    pt c/o prod cough with white mucus, increased SOB with any exertion.    Gilbert Jefferson went to [place] and visited his daughter outside [place] this Christmas and did OK in terms of breathing.  He didn't need to adjust his O2 up at all.    In the last week he will cough up thick mucus, this has been going on for a week.  He doesn't have any tightness.  No fever, no chills.  His wife just got over a cold.  He is using his albuterol a little more.  Still taking Trelegy.  He finished his TB treatment on 11/16/16.   His energy level is still very low.  He is not sleeping well at night. Wakes up a lot to urinate.  He notes loose bowels for several months.  His PCP is evaluating him for this.  He has gained some weight.  No leg swelling.    He has never had a sleep study in the past.    He had a repeat PSA by a urologist which was clear.  He didn't comment on BPH.     Past Medical History:  Diagnosis Date  . COPD (chronic obstructive pulmonary disease) ([place])   . Hypertension   . Osteoarthrosis   . Varicose veins       Review of Systems  Constitutional: Positive for fatigue. Negative for chills and fever.  HENT: Negative for rhinorrhea and sinus pressure.   Respiratory: Positive for cough and shortness of breath. Negative for wheezing.   Cardiovascular: Negative for chest pain, palpitations and leg swelling.       Objective:   Physical Exam Vitals:   02/21/17 0914  BP: (!) 142/78  Pulse: 94  SpO2: 95%  Weight: 214 lb (97.1 kg)    Height: 5\' 7"  (1.702 m)   2L  Gen: chronically ill appearing HENT: OP clear, TM's clear, neck supple PULM: Poor air omvement, some wheezing in base B, normal percussion CV: RRR, no mgr, trace edema GI: BS+, soft, nontender Derm: no cyanosis or rash Psyche: normal mood and affect      CBC    Component Value Date/Time   WBC 6.7 10/23/2016 0954   RBC 5.27 10/23/2016 0954   HGB 15.7 10/23/2016 0954   HCT 46.8 10/23/2016 0954   PLT 268.0 10/23/2016 0954   MCV 88.8 10/23/2016 0954   MCH 31.6 04/28/2010 1621   MCHC 33.5 10/23/2016 0954   RDW 14.2 10/23/2016 0954   LYMPHSABS 1.1 10/23/2016 0954   MONOABS 0.6 10/23/2016 0954   EOSABS 0.1 10/23/2016 0954   BASOSABS 0.0 10/23/2016 0954    BMET    Component Value Date/Time   NA 140 04/28/2010 1644   K 5.2 (H) 04/28/2010 1644   CL 105 04/28/2010 1644   GLUCOSE 80 04/28/2010 1644   BUN 17 04/28/2010 1644   CREATININE 1.0 04/28/2010 1644          Assessment & Plan:   Chronic bronchitis, unspecified chronic bronchitis type ([place]) -  Plan: Respiratory or Resp and Sputum Culture, AFB Culture & Smear, Fungus Culture & Smear, DG Chest 2 View, Pulmonary function test  Other fatigue - Plan: Polysomnography 4 or more parameters (NPSG)  COPD with acute exacerbation (HCC)  Chronic respiratory failure with hypoxia [facility])  Discussion: Gilbert Jefferson is having another mild exacerbation of his severe COPD.  He needs to be treated with an antibiotic for this.  We will check a chest x-ray to make sure there is nothing else going on.  In the background however, he has worsening fatigue over the last year.  This could certainly be due to severe COPD.  Fortunately last visits thyroid test was normal and he has been evaluated by urology and has been told that he does not have prostate cancer.  He is at risk for obstructive sleep apnea given his weight and severe COPD and daytime fatigue so we need to assess for that.  We also need to get a  repeat pulmonary function test to just see if the COPD has worsened.  He is quite focused on the idea of having a lung transplant.  We talked frankly about this.  If this round of testing reveals no evidence of recurrent TB, no evidence of underlying sleep apnea, then on the next visit he may need to be evaluated by Gilbert Jefferson.  Plan: Severe COPD with exacerbation: Take doxycycline 100 mg twice a day times 5 days Please provide [place] with a sample of your mucus as we will tested for bacteria, fungus, AFB Keep taking Trelegy daily Use albuterol as needed We will check a chest x-ray today to make sure there is nothing else going on  Chronic respiratory failure with hypoxemia: Continue 2 L of oxygen at rest, 3 L with exertion  Fatigue: We will check a sleep study We will repeat a pulmonary function test  We will see you back in 6-8 weeks or sooner if needed   Current Outpatient Medications:  .  albuterol (PROAIR HFA) 108 (90 Base) MCG/ACT inhaler, Inhale 1-2 puffs into the lungs every 6 (six) hours as needed for shortness of breath., Disp: 3 Inhaler, Rfl: 3 .  albuterol (PROVENTIL) (2.5 MG/3ML) 0.083% nebulizer solution, Take 3 mLs (2.5 mg total) by nebulization every 6 (six) hours as needed for wheezing or shortness of breath., Disp: 360 mL, Rfl: 11 .  busPIRone (BUSPAR) 15 MG tablet, Take 7.5 mg by mouth 2 (two) times daily., Disp: , Rfl:  .  Fluticasone-Umeclidin-Vilant (TRELEGY ELLIPTA) 100-62.5-25 MCG/INH AEPB, Inhale 1 puff into the lungs daily., Disp: 3 each, Rfl: 4 .  OXYGEN, Oxygen 2.5 lpm with sleep and as needed during the day, 3lpm with exertion, Disp: , Rfl:  .  Respiratory Therapy Supplies (FLUTTER) DEVI, 1 Device by Does not apply route as needed., Disp: 1 each, Rfl: 0 .  tamsulosin (FLOMAX) 0.4 MG CAPS capsule, Take 0.4 mg by mouth daily., Disp: , Rfl:  .  doxycycline (VIBRA-TABS) 100 MG tablet, Take 1 tablet (100 mg total) by mouth 2 (two) times daily., Disp: 10 tablet, Rfl:  0

## 2017-02-22 ENCOUNTER — Encounter (HOSPITAL_COMMUNITY)
Admission: RE | Admit: 2017-02-22 | Discharge: 2017-02-22 | Disposition: A | Discharge status: Home / Self Care | Payer: Self-pay | Source: Ambulatory Visit | Attending: Internal Medicine | Admitting: Internal Medicine

## 2017-02-22 ENCOUNTER — Other Ambulatory Visit: Payer: Medicare Other

## 2017-02-22 DIAGNOSIS — J42 Unspecified chronic bronchitis: Secondary | ICD-10-CM

## 2017-02-25 ENCOUNTER — Ambulatory Visit (INDEPENDENT_AMBULATORY_CARE_PROVIDER_SITE_OTHER): Payer: Medicare Other | Admitting: Pulmonary Disease

## 2017-02-25 DIAGNOSIS — J42 Unspecified chronic bronchitis: Secondary | ICD-10-CM | POA: Diagnosis not present

## 2017-02-25 LAB — PULMONARY FUNCTION TEST
DL/VA % PRED: 54 %
DL/VA: 2.39 ml/min/mmHg/L
DLCO COR: 12.14 ml/min/mmHg
DLCO UNC % PRED: 40 %
DLCO cor % pred: 42 %
DLCO unc: 11.35 ml/min/mmHg
FEF 25-75 PRE: 0.36 L/s
FEF 25-75 Post: 0.36 L/sec
FEF2575-%Change-Post: 0 %
FEF2575-%PRED-POST: 14 %
FEF2575-%Pred-Pre: 14 %
FEV1-%Change-Post: 0 %
FEV1-%Pred-Post: 28 %
FEV1-%Pred-Pre: 28 %
FEV1-Post: 0.88 L
FEV1-Pre: 0.88 L
FEV1FVC-%Change-Post: 0 %
FEV1FVC-%Pred-Pre: 54 %
FEV6-%CHANGE-POST: 0 %
FEV6-%PRED-POST: 52 %
FEV6-%Pred-Pre: 51 %
FEV6-Post: 2.03 L
FEV6-Pre: 2.02 L
FEV6FVC-%CHANGE-POST: 0 %
FEV6FVC-%Pred-Post: 99 %
FEV6FVC-%Pred-Pre: 99 %
FVC-%Change-Post: 0 %
FVC-%Pred-Post: 52 %
FVC-%Pred-Pre: 52 %
FVC-Post: 2.15 L
FVC-Pre: 2.14 L
POST FEV1/FVC RATIO: 41 %
POST FEV6/FVC RATIO: 94 %
PRE FEV1/FVC RATIO: 41 %
Pre FEV6/FVC Ratio: 94 %

## 2017-02-25 LAB — RESPIRATORY CULTURE OR RESPIRATORY AND SPUTUM CULTURE
MICRO NUMBER: 90109031
RESULT:: NORMAL
SPECIMEN QUALITY: ADEQUATE

## 2017-02-25 NOTE — Progress Notes (Signed)
PFT completed today unable to obtain pleth 02/25/17

## 2017-02-27 ENCOUNTER — Encounter (HOSPITAL_COMMUNITY)
Admission: RE | Admit: 2017-02-27 | Discharge: 2017-02-27 | Disposition: A | Discharge status: Home / Self Care | Payer: Self-pay | Source: Ambulatory Visit | Attending: Pulmonary Disease | Admitting: Pulmonary Disease

## 2017-02-28 ENCOUNTER — Ambulatory Visit (HOSPITAL_BASED_OUTPATIENT_CLINIC_OR_DEPARTMENT_OTHER): Payer: Medicare Other | Attending: Pulmonary Disease | Admitting: Pulmonary Disease

## 2017-02-28 VITALS — Ht 67.0 in | Wt 214.0 lb

## 2017-02-28 DIAGNOSIS — R5383 Other fatigue: Secondary | ICD-10-CM

## 2017-02-28 DIAGNOSIS — G4734 Idiopathic sleep related nonobstructive alveolar hypoventilation: Secondary | ICD-10-CM

## 2017-03-01 ENCOUNTER — Encounter (HOSPITAL_COMMUNITY)
Admission: RE | Admit: 2017-03-01 | Discharge: 2017-03-01 | Disposition: A | Discharge status: Home / Self Care | Payer: Self-pay | Source: Ambulatory Visit | Attending: Internal Medicine | Admitting: Internal Medicine

## 2017-03-01 DIAGNOSIS — J432 Centrilobular emphysema: Secondary | ICD-10-CM | POA: Insufficient documentation

## 2017-03-04 ENCOUNTER — Encounter (HOSPITAL_COMMUNITY)
Admission: RE | Admit: 2017-03-04 | Discharge: 2017-03-04 | Disposition: A | Discharge status: Home / Self Care | Payer: Self-pay | Source: Ambulatory Visit | Attending: Pulmonary Disease | Admitting: Pulmonary Disease

## 2017-03-06 ENCOUNTER — Encounter (HOSPITAL_COMMUNITY)
Admission: RE | Admit: 2017-03-06 | Discharge: 2017-03-06 | Disposition: A | Discharge status: Home / Self Care | Payer: Self-pay | Source: Ambulatory Visit | Attending: Pulmonary Disease | Admitting: Pulmonary Disease

## 2017-03-08 ENCOUNTER — Encounter (HOSPITAL_COMMUNITY): Payer: Self-pay

## 2017-03-10 DIAGNOSIS — R5383 Other fatigue: Secondary | ICD-10-CM | POA: Diagnosis not present

## 2017-03-10 NOTE — Procedures (Signed)
   Patient Name: Gilbert Jefferson, Gilbert Jefferson Date: 02/28/2017 Gender: Male D.O.B: Feb 07, 1950 Age (years): 52 Referring Provider: Simonne Maffucci Height (inches): 10 Interpreting Physician: Chesley Mires MD, ABSM Weight (lbs): 214 RPSGT: Lanae Boast BMI: 34 MRN: [id number] Neck Size: 17.00  CLINICAL INFORMATION Sleep Study Type: NPSG  Indication for sleep study: Fatigue, history of COPD on 2 liters home oxygen.  Epworth Sleepiness Score: 5  SLEEP STUDY TECHNIQUE As per the AASM Manual for the Scoring of Sleep and Associated Events v2.3 (April 2016) with a hypopnea requiring 4% desaturations.  The channels recorded and monitored were frontal, central and occipital EEG, electrooculogram (EOG), submentalis EMG (chin), nasal and oral airflow, thoracic and abdominal wall motion, anterior tibialis EMG, snore microphone, electrocardiogram, and pulse oximetry.  MEDICATIONS Medications self-administered by patient taken the night of the study : N/A  SLEEP ARCHITECTURE The study was initiated at 10:35:13 PM and ended at 5:08:26 AM.  Sleep onset time was 14.4 minutes and the sleep efficiency was 58.2%. The total sleep time was 228.8 minutes.  Stage REM latency was 167.0 minutes.  The patient spent 12.16% of the night in stage N1 sleep, 68.61% in stage N2 sleep, 0.66% in stage N3 and 18.57% in REM.  Alpha intrusion was absent.  Supine sleep was 0.66%.  RESPIRATORY PARAMETERS The overall apnea/hypopnea index (AHI) was 0.0 per hour. There were 0 total apneas, including 0 obstructive, 0 central and 0 mixed apneas. There were 0 hypopneas and 13 RERAs.  The AHI during Stage REM sleep was 0.0 per hour.  AHI while supine was 0.0 per hour.  The mean oxygen saturation was 96.00%. The minimum SpO2 during sleep was 88.00%.  The study was conducted with him using 2 liters oxygen.  moderate snoring was noted during this study.  CARDIAC DATA The 2 lead EKG demonstrated sinus rhythm. The  mean heart rate was 64.74 beats per minute. Other EKG findings include: None.  LEG MOVEMENT DATA The total PLMS were 49 with a resulting PLMS index of 12.85. Associated arousal with leg movement index was 0.0 .  IMPRESSIONS - This study did not show evidence for obstructive sleep apnea.  His overall AHI was 0. - He had good control of his oxygenation with the use of 2 liters supplemental oxygen during the study.  DIAGNOSIS - Nocturnal Hypoxemia (327.26 [G47.36 ICD-10])  RECOMMENDATIONS - Avoid alcohol, sedatives and other CNS depressants that may worsen sleep apnea and disrupt normal sleep architecture. - Sleep hygiene should be reviewed to assess factors that may improve sleep quality. - Weight management and regular exercise should be initiated or continued if appropriate.  [Electronically signed] 03/10/2017 05:43 PM  Chesley Mires MD, [place], American Board of Sleep Medicine  NPI: [id number]

## 2017-03-11 ENCOUNTER — Encounter (HOSPITAL_COMMUNITY)
Admission: RE | Admit: 2017-03-11 | Discharge: 2017-03-11 | Disposition: A | Discharge status: Home / Self Care | Payer: Self-pay | Source: Ambulatory Visit | Attending: Pulmonary Disease | Admitting: Pulmonary Disease

## 2017-03-13 ENCOUNTER — Encounter (HOSPITAL_COMMUNITY): Payer: Self-pay

## 2017-03-14 NOTE — Progress Notes (Signed)
Patient aware of results/recs.

## 2017-03-15 ENCOUNTER — Encounter (HOSPITAL_COMMUNITY)
Admission: RE | Admit: 2017-03-15 | Discharge: 2017-03-15 | Disposition: A | Discharge status: Home / Self Care | Payer: Self-pay | Source: Ambulatory Visit | Attending: Pulmonary Disease | Admitting: Pulmonary Disease

## 2017-03-18 ENCOUNTER — Encounter (HOSPITAL_COMMUNITY)
Admission: RE | Admit: 2017-03-18 | Discharge: 2017-03-18 | Disposition: A | Discharge status: Home / Self Care | Payer: Self-pay | Source: Ambulatory Visit | Attending: Pulmonary Disease | Admitting: Pulmonary Disease

## 2017-03-20 ENCOUNTER — Encounter (HOSPITAL_COMMUNITY)
Admission: RE | Admit: 2017-03-20 | Discharge: 2017-03-20 | Disposition: A | Discharge status: Home / Self Care | Payer: Self-pay | Source: Ambulatory Visit | Attending: Pulmonary Disease | Admitting: Pulmonary Disease

## 2017-03-22 ENCOUNTER — Encounter (HOSPITAL_COMMUNITY): Payer: Self-pay

## 2017-03-22 LAB — FUNGUS CULTURE W SMEAR
MICRO NUMBER:: 90109029
SPECIMEN QUALITY:: ADEQUATE

## 2017-03-25 ENCOUNTER — Encounter (HOSPITAL_COMMUNITY)
Admission: RE | Admit: 2017-03-25 | Discharge: 2017-03-25 | Disposition: A | Discharge status: Home / Self Care | Payer: Self-pay | Source: Ambulatory Visit | Attending: Pulmonary Disease | Admitting: Pulmonary Disease

## 2017-03-27 ENCOUNTER — Encounter (HOSPITAL_COMMUNITY)
Admission: RE | Admit: 2017-03-27 | Discharge: 2017-03-27 | Disposition: A | Discharge status: Home / Self Care | Payer: Self-pay | Source: Ambulatory Visit | Attending: Pulmonary Disease | Admitting: Pulmonary Disease

## 2017-03-29 ENCOUNTER — Encounter (HOSPITAL_COMMUNITY)
Admission: RE | Admit: 2017-03-29 | Discharge: 2017-03-29 | Disposition: A | Discharge status: Home / Self Care | Payer: Self-pay | Source: Ambulatory Visit | Attending: Internal Medicine | Admitting: Internal Medicine

## 2017-03-29 DIAGNOSIS — J432 Centrilobular emphysema: Secondary | ICD-10-CM | POA: Insufficient documentation

## 2017-04-01 ENCOUNTER — Encounter (HOSPITAL_COMMUNITY): Payer: Self-pay

## 2017-04-03 ENCOUNTER — Encounter (HOSPITAL_COMMUNITY)
Admission: RE | Admit: 2017-04-03 | Discharge: 2017-04-03 | Disposition: A | Discharge status: Home / Self Care | Payer: Self-pay | Source: Ambulatory Visit | Attending: Pulmonary Disease | Admitting: Pulmonary Disease

## 2017-04-05 ENCOUNTER — Encounter (HOSPITAL_COMMUNITY)
Admission: RE | Admit: 2017-04-05 | Discharge: 2017-04-05 | Disposition: A | Discharge status: Home / Self Care | Payer: Self-pay | Source: Ambulatory Visit | Attending: Pulmonary Disease | Admitting: Pulmonary Disease

## 2017-04-08 ENCOUNTER — Encounter (HOSPITAL_COMMUNITY): Payer: Self-pay

## 2017-04-09 ENCOUNTER — Ambulatory Visit (INDEPENDENT_AMBULATORY_CARE_PROVIDER_SITE_OTHER): Payer: Medicare Other | Admitting: Pulmonary Disease

## 2017-04-09 ENCOUNTER — Encounter: Payer: Self-pay | Admitting: Pulmonary Disease

## 2017-04-09 VITALS — BP 138/82 | HR 99 | Ht 67.0 in | Wt 214.0 lb

## 2017-04-09 DIAGNOSIS — J9611 Chronic respiratory failure with hypoxia: Secondary | ICD-10-CM

## 2017-04-09 DIAGNOSIS — J42 Unspecified chronic bronchitis: Secondary | ICD-10-CM | POA: Diagnosis not present

## 2017-04-09 DIAGNOSIS — A159 Respiratory tuberculosis unspecified: Secondary | ICD-10-CM

## 2017-04-09 DIAGNOSIS — J449 Chronic obstructive pulmonary disease, unspecified: Secondary | ICD-10-CM

## 2017-04-09 MED ORDER — FLUTICASONE-UMECLIDIN-VILANT 100-62.5-25 MCG/INH IN AEPB: 1.0000 | INHALATION_SPRAY | Freq: Every day | RESPIRATORY_TRACT | 0 refills | Status: DC

## 2017-04-09 MED ORDER — BUSPIRONE HCL 15 MG PO TABS: 7.5000 mg | ORAL_TABLET | Freq: Two times a day (BID) | ORAL | 0 refills | Status: DC

## 2017-04-09 NOTE — Progress Notes (Signed)
Subjective:    Patient ID: Gilbert Jefferson, male    DOB: November 26, 1950, 67 y.o.   MRN: [id number]  Synopsis: Former patient of Dr. Joya Gaskins with advanced COPD on 2L O2 who was diagnosed with TB in April 2018; completed treatment 11/16/2016    HPI Chief Complaint  Patient presents with  . Follow-up    pt states he is at baseline SOB.  Denies any sinus congestion, cough/mucus production.     Long says that his insurance company changed so he hasn't been able to exercise much at the [facility].  However he is stillgoing to [facility]hab.  He is still using and benefiting from his oxygen regularly.    His wife says that he can barely walk across his living room without stopping to gasp for air while leaning over his table to stabilize himself.  He wants to know if he could use a scooter to help get around. The weight of his oxygen concentrator is high for him too and limits his ability walk around.  No recent fevers or weight loss.    Past Medical History:  Diagnosis Date  . COPD (chronic obstructive pulmonary disease) ([place])   . Hypertension   . Osteoarthrosis   . Varicose veins       Review of Systems  Constitutional: Positive for fatigue. Negative for chills and fever.  HENT: Negative for rhinorrhea and sinus pressure.   Respiratory: Positive for cough and shortness of breath. Negative for wheezing.   Cardiovascular: Negative for chest pain, palpitations and leg swelling.       Objective:   Physical Exam Vitals:   04/09/17 1524  BP: 138/82  Pulse: 99  SpO2: 94%  Weight: 214 lb (97.1 kg)  Height: 5\' 7"  (1.702 m)   2L  Gen: chronically ill appearing HENT: OP clear, TM's clear, neck supple PULM: Poor air movement, no wheezing B, normal percussion CV: RRR, no mgr, trace edema GI: BS+, soft, nontender Derm: no cyanosis or rash Psyche: normal mood and affect    Sleep study: 02/28/2017 AHI 0.0, O2 saturation nadir 88% on 2L [place]  Overnight oximetry: 01/18/2012 ONO  RA normal  PFT: Arlyce Harman 10/08/12: FeV1 29% FeV1/FVC 41% Spiro 04/10/2013>> FeV1 26% FVC 56% FeV1/FVC 53%  PFT 01/2017: Ration 41% FEV1 0.88L (28% pred), FVC 2.15 L (52% pred)< DLCO 11.35 (40% pred)  Labs: Alpha one antitrypsin 04/10/2013>>normal 145  Micro: 01/2017 Sputum AFB culture negative    CBC    Component Value Date/Time   WBC 6.7 10/23/2016 0954   RBC 5.27 10/23/2016 0954   HGB 15.7 10/23/2016 0954   HCT 46.8 10/23/2016 0954   PLT 268.0 10/23/2016 0954   MCV 88.8 10/23/2016 0954   MCH 31.6 04/28/2010 1621   MCHC 33.5 10/23/2016 0954   RDW 14.2 10/23/2016 0954   LYMPHSABS 1.1 10/23/2016 0954   MONOABS 0.6 10/23/2016 0954   EOSABS 0.1 10/23/2016 0954   BASOSABS 0.0 10/23/2016 0954    BMET    Component Value Date/Time   NA 140 04/28/2010 1644   K 5.2 (H) 04/28/2010 1644   CL 105 04/28/2010 1644   GLUCOSE 80 04/28/2010 1644   BUN 17 04/28/2010 1644   CREATININE 1.0 04/28/2010 1644          Assessment & Plan:   Chronic bronchitis, unspecified chronic bronchitis type ([place])  Chronic respiratory failure with hypoxia (HCC)  Mycobacterium tuberculosis infection  Chronic obstructive pulmonary disease, unspecified COPD type ([place])  Discussion: This has been  a stable interval for Gaylen.  His sputum cultures show no evidence of recurrent tuberculosis nor does he have any symptoms to suggest this is worse.  After some thought he has decided that he would rather not undergo a lung transplant evaluation which I think is reasonable considering the uncertain benefits in patients with COPD.  Plan: Severe shortness of breath in the setting of severe COPD: Because you have difficulty getting around the house we will write an order for a motorized scooter, please call us and let us know where you would like this order to go Continue taking Trelegy daily  Chronic respiratory failure with hypoxemia: Continue using oxygen as you are doing  History of Mycobacterium  tuberculosis: The cultures that we collected in January are negative, we will let you know if there is a change in the status Please let us know if he had fevers chills or weight loss  We will see you back in 3 months or sooner if needed  15 minutes spent in consultation with he and his wife, 26 minute visit   Current Outpatient Medications:  .  albuterol (PROAIR HFA) 108 (90 Base) MCG/ACT inhaler, Inhale 1-2 puffs into the lungs every 6 (six) hours as needed for shortness of breath., Disp: 3 Inhaler, Rfl: 3 .  albuterol (PROVENTIL) (2.5 MG/3ML) 0.083% nebulizer solution, Take 3 mLs (2.5 mg total) by nebulization every 6 (six) hours as needed for wheezing or shortness of breath., Disp: 360 mL, Rfl: 11 .  Fluticasone-Umeclidin-Vilant (TRELEGY ELLIPTA) 100-62.5-25 MCG/INH AEPB, Inhale 1 puff into the lungs daily., Disp: 3 each, Rfl: 4 .  OXYGEN, Oxygen 2.5 lpm with sleep and as needed during the day, 3lpm with exertion, Disp: , Rfl:  .  Respiratory Therapy Supplies (FLUTTER) DEVI, 1 Device by Does not apply route as needed., Disp: 1 each, Rfl: 0 .  tamsulosin (FLOMAX) 0.4 MG CAPS capsule, Take 0.4 mg by mouth daily., Disp: , Rfl:  .  busPIRone (BUSPAR) 15 MG tablet, Take 7.5 mg by mouth 2 (two) times daily., Disp: , Rfl:  .  Fluticasone-Umeclidin-Vilant (TRELEGY ELLIPTA) 100-62.5-25 MCG/INH AEPB, Inhale 1 puff into the lungs daily., Disp: 1 each, Rfl: 0

## 2017-04-09 NOTE — Patient Instructions (Signed)
Severe shortness of breath in the setting of severe COPD: Because you have difficulty getting around the house we will write an order for a motorized scooter, please call us and let us know where you would like this order to go Continue taking Trelegy daily  Chronic respiratory failure with hypoxemia: Continue using oxygen as you are doing  History of Mycobacterium tuberculosis: The cultures that we collected in January are negative, we will let you know if there is a change in the status Please let us know if he had fevers chills or weight loss  We will see you back in 3 months or sooner if needed

## 2017-04-09 NOTE — Addendum Note (Signed)
Addended by: Len Blalock on: 04/09/2017 04:15 PM   Modules accepted: Orders

## 2017-04-10 ENCOUNTER — Telehealth: Payer: Self-pay | Admitting: Pulmonary Disease

## 2017-04-10 ENCOUNTER — Encounter (HOSPITAL_COMMUNITY)
Admission: RE | Admit: 2017-04-10 | Discharge: 2017-04-10 | Disposition: A | Discharge status: Home / Self Care | Payer: Self-pay | Source: Ambulatory Visit | Attending: Pulmonary Disease | Admitting: Pulmonary Disease

## 2017-04-10 LAB — MYCOBACTERIA,CULT W/FLUOROCHROME SMEAR
MICRO NUMBER:: 90109028
SMEAR: NONE SEEN
SPECIMEN QUALITY: ADEQUATE

## 2017-04-10 MED ORDER — BUSPIRONE HCL 15 MG PO TABS: 7.5000 mg | ORAL_TABLET | Freq: Two times a day (BID) | ORAL | 3 refills | Status: AC

## 2017-04-10 NOTE — Telephone Encounter (Signed)
Fax number provided was new mail order pharmacy for pt- [place].  Pt aware that this is new mail order pharmacy.  RX refilled to mail order pharmacy as requested yesterday in [place].  Nothing further needed.

## 2017-04-10 NOTE — Addendum Note (Signed)
Addended by: Len Blalock on: 04/10/2017 10:42 AM   Modules accepted: Orders

## 2017-04-10 NOTE — Telephone Encounter (Signed)
Will personally hand this to Caryl Pina and route this to her as well. Nothing further needed.

## 2017-04-12 ENCOUNTER — Encounter (HOSPITAL_COMMUNITY)
Admission: RE | Admit: 2017-04-12 | Discharge: 2017-04-12 | Disposition: A | Discharge status: Home / Self Care | Payer: Medicare Other | Source: Ambulatory Visit | Attending: Pulmonary Disease | Admitting: Pulmonary Disease

## 2017-04-15 ENCOUNTER — Encounter (HOSPITAL_COMMUNITY)
Admission: RE | Admit: 2017-04-15 | Discharge: 2017-04-15 | Disposition: A | Discharge status: Home / Self Care | Payer: Self-pay | Source: Ambulatory Visit | Attending: Pulmonary Disease | Admitting: Pulmonary Disease

## 2017-04-17 ENCOUNTER — Encounter (HOSPITAL_COMMUNITY)
Admission: RE | Admit: 2017-04-17 | Discharge: 2017-04-17 | Disposition: A | Discharge status: Home / Self Care | Payer: Self-pay | Source: Ambulatory Visit | Attending: Pulmonary Disease | Admitting: Pulmonary Disease

## 2017-04-19 ENCOUNTER — Encounter (HOSPITAL_COMMUNITY): Payer: Self-pay

## 2017-04-22 ENCOUNTER — Encounter (HOSPITAL_COMMUNITY): Payer: Self-pay

## 2017-04-23 ENCOUNTER — Telehealth: Payer: Self-pay | Admitting: Pulmonary Disease

## 2017-04-23 DIAGNOSIS — J449 Chronic obstructive pulmonary disease, unspecified: Secondary | ICD-10-CM

## 2017-04-23 NOTE — Telephone Encounter (Signed)
Attempted to contact pt. No answer, no option to leave a message. Will try back.  

## 2017-04-24 ENCOUNTER — Encounter (HOSPITAL_COMMUNITY)
Admission: RE | Admit: 2017-04-24 | Discharge: 2017-04-24 | Disposition: A | Discharge status: Home / Self Care | Payer: Self-pay | Source: Ambulatory Visit | Attending: Pulmonary Disease | Admitting: Pulmonary Disease

## 2017-04-24 NOTE — Telephone Encounter (Signed)
Left message for patient to call back  

## 2017-04-24 NOTE — Telephone Encounter (Signed)
Patient returned call, CB is [phone]

## 2017-04-24 NOTE — Telephone Encounter (Signed)
Spoke with patient. He was calling back with more information about the power chair that he needs. He also found out that his insurance will not cover the cost, therefore he has to pay for the chair out of pocket.   He has found a local store called [employer]. If he has a RX, they will take off 10%. The chair he is requesting is called a Arts administrator. Model #[id number].   BQ, please advise if you are ok with this RX. Thanks!

## 2017-04-25 ENCOUNTER — Encounter (HOSPITAL_COMMUNITY): Payer: Self-pay | Admitting: *Deleted

## 2017-04-25 NOTE — Progress Notes (Signed)
Pulmonary Rehab Gilbert Jefferson has decided to drop from the Pulmonary Rehab Maintenance program. He wants to try going  to the [facility] to exercise. I have encouraged him to contact us if we can be of any assistance to him in the future.

## 2017-04-25 NOTE — Telephone Encounter (Signed)
I am OK ordering that.  Have him discuss with [facility] though that medicare will sometimes pay for these if he has trouble getting around in the house with dyspnea.

## 2017-04-25 NOTE — Telephone Encounter (Signed)
Called and spoke with pt and his wife letting him know it was okay for [place] to order pt the power chair through [facility]  Pt stated to me if he had an Rx of the order, he could take it with him to [facility] and be able to get 10% taken off of the cost.  I specified in the order the [place] and stated the chair pt was requesting.  Rx has been placed up front in the brown accordion folder for pt to come pick up.  Nothing further needed at this time.

## 2017-04-26 ENCOUNTER — Encounter (HOSPITAL_COMMUNITY): Payer: Self-pay

## 2017-04-29 ENCOUNTER — Encounter (HOSPITAL_COMMUNITY): Payer: Self-pay

## 2017-05-01 ENCOUNTER — Other Ambulatory Visit: Payer: Self-pay | Admitting: Acute Care

## 2017-05-01 ENCOUNTER — Encounter (HOSPITAL_COMMUNITY): Payer: Self-pay

## 2017-05-03 ENCOUNTER — Encounter (HOSPITAL_COMMUNITY): Payer: Self-pay

## 2017-05-06 ENCOUNTER — Encounter (HOSPITAL_COMMUNITY): Payer: Self-pay

## 2017-05-08 ENCOUNTER — Encounter (HOSPITAL_COMMUNITY): Payer: Self-pay

## 2017-05-10 ENCOUNTER — Encounter (HOSPITAL_COMMUNITY): Payer: Self-pay

## 2017-05-13 ENCOUNTER — Encounter (HOSPITAL_COMMUNITY): Payer: Self-pay

## 2017-05-15 ENCOUNTER — Encounter (HOSPITAL_COMMUNITY): Payer: Self-pay

## 2017-05-17 ENCOUNTER — Encounter (HOSPITAL_COMMUNITY): Payer: Self-pay

## 2017-05-20 ENCOUNTER — Encounter (HOSPITAL_COMMUNITY): Payer: Self-pay

## 2017-05-22 ENCOUNTER — Encounter (HOSPITAL_COMMUNITY): Payer: Self-pay

## 2017-05-24 ENCOUNTER — Encounter (HOSPITAL_COMMUNITY): Payer: Self-pay

## 2017-05-27 ENCOUNTER — Encounter (HOSPITAL_COMMUNITY): Payer: Self-pay

## 2017-05-29 ENCOUNTER — Encounter (HOSPITAL_COMMUNITY): Payer: Self-pay

## 2017-05-31 ENCOUNTER — Encounter (HOSPITAL_COMMUNITY): Payer: Self-pay

## 2017-06-03 ENCOUNTER — Encounter (HOSPITAL_COMMUNITY): Payer: Self-pay

## 2017-06-05 ENCOUNTER — Encounter (HOSPITAL_COMMUNITY): Payer: Self-pay

## 2017-06-07 ENCOUNTER — Encounter (HOSPITAL_COMMUNITY): Payer: Self-pay

## 2017-06-10 ENCOUNTER — Encounter (HOSPITAL_COMMUNITY): Payer: Self-pay

## 2017-06-12 ENCOUNTER — Encounter (HOSPITAL_COMMUNITY): Payer: Self-pay

## 2017-06-14 ENCOUNTER — Encounter (HOSPITAL_COMMUNITY): Payer: Self-pay

## 2017-06-17 ENCOUNTER — Encounter (HOSPITAL_COMMUNITY): Payer: Self-pay

## 2017-06-19 ENCOUNTER — Encounter (HOSPITAL_COMMUNITY): Payer: Self-pay

## 2017-06-21 ENCOUNTER — Encounter (HOSPITAL_COMMUNITY): Payer: Self-pay

## 2017-06-26 ENCOUNTER — Encounter (HOSPITAL_COMMUNITY): Payer: Self-pay

## 2017-06-28 ENCOUNTER — Encounter (HOSPITAL_COMMUNITY): Payer: Self-pay

## 2017-07-01 ENCOUNTER — Encounter (HOSPITAL_COMMUNITY): Payer: Self-pay

## 2017-07-03 ENCOUNTER — Encounter (HOSPITAL_COMMUNITY): Payer: Self-pay

## 2017-07-05 ENCOUNTER — Encounter (HOSPITAL_COMMUNITY): Payer: Self-pay

## 2017-07-08 ENCOUNTER — Encounter (HOSPITAL_COMMUNITY): Payer: Self-pay

## 2017-07-10 ENCOUNTER — Encounter (HOSPITAL_COMMUNITY): Payer: Self-pay

## 2017-07-12 ENCOUNTER — Encounter (HOSPITAL_COMMUNITY): Payer: Self-pay

## 2017-07-15 ENCOUNTER — Encounter (HOSPITAL_COMMUNITY): Payer: Self-pay

## 2017-07-31 ENCOUNTER — Telehealth: Payer: Self-pay | Admitting: Pulmonary Disease

## 2017-07-31 MED ORDER — PREDNISONE 10 MG PO TABS: ORAL_TABLET | ORAL | 0 refills | Status: DC

## 2017-07-31 MED ORDER — DOXYCYCLINE HYCLATE 100 MG PO TABS: 100.0000 mg | ORAL_TABLET | Freq: Two times a day (BID) | ORAL | 0 refills | Status: DC

## 2017-07-31 NOTE — Telephone Encounter (Signed)
ATC pt, no answer. Left message for pt to call back.  

## 2017-07-31 NOTE — Telephone Encounter (Signed)
Spoke with pt, he is not able to come in for an office visit because his dad just got out the hospital and his mom is bed ridden. The people that help him are out of town for the holiday so there is no way he could come this afternoon. Is there anything else we can do for pt? It if after 12 so Im sending this to afternoon DOD.

## 2017-07-31 NOTE — Telephone Encounter (Signed)
Ov with Beth this evening  Thanks

## 2017-07-31 NOTE — Telephone Encounter (Signed)
Called and spoke with patient today not feeling well in last 2 days. Pt reports productive cough-green mucus, chest tightness, postnasal drip for last 2 days. Offered pt OV today, pt declined.  Pt is requesting something to be called into his pharmacy. Advised patient that BQ is out of the office today, and will reach out to DOD TP today.  Pt last OV with BQ was 04/09/17 for chronic bronchitis Last OV with TP was 06/29/2016 Allergies  Allergen Reactions  . Azithromycin     Wife noted memory loss when patient was on this chronically    TP please advise.

## 2017-07-31 NOTE — Telephone Encounter (Signed)
Doxy 100 mg bid x 10 days Prednisone 10 mg take  4 each am x 2 days,   2 each am x 2 days,  1 each am x 2 days and stop

## 2017-07-31 NOTE — Telephone Encounter (Signed)
Pt is calling back [phone] 

## 2017-07-31 NOTE — Telephone Encounter (Signed)
Pt is aware of below recommendations and voiced his understanding. Rx for doxy and prednisone has been sent to preferred pharmacy. Nothing further is needed.

## 2017-09-05 ENCOUNTER — Encounter: Payer: Self-pay | Admitting: Pulmonary Disease

## 2017-09-05 ENCOUNTER — Ambulatory Visit (INDEPENDENT_AMBULATORY_CARE_PROVIDER_SITE_OTHER): Payer: Medicare Other | Admitting: Pulmonary Disease

## 2017-09-05 VITALS — BP 114/62 | HR 60 | Ht 68.0 in | Wt 204.0 lb

## 2017-09-05 DIAGNOSIS — J9611 Chronic respiratory failure with hypoxia: Secondary | ICD-10-CM

## 2017-09-05 DIAGNOSIS — J449 Chronic obstructive pulmonary disease, unspecified: Secondary | ICD-10-CM

## 2017-09-05 MED ORDER — FLUTICASONE-UMECLIDIN-VILANT 100-62.5-25 MCG/INH IN AEPB: 1.0000 | INHALATION_SPRAY | Freq: Every day | RESPIRATORY_TRACT | 0 refills | Status: DC

## 2017-09-05 NOTE — Progress Notes (Signed)
Subjective:    Patient ID: Gilbert Jefferson, male    DOB: 1950-05-24, 67 y.o.   MRN: [id number]  Synopsis: Former patient of Dr. Joya Gaskins with advanced COPD on 2L O2 who was diagnosed with TB in April 2018; completed treatment 11/16/2016    HPI Chief Complaint  Patient presents with  . Follow-up   Gilbert Jefferson has been down to the beach with his family recently and has been enjoying that for the last two weeks.   His breathing has been doing OK.  He says taht a few weeks ago he had some congestion and called in and had some prednisone called in.  He says that he couldn't come in because his family was sick and he had to keep an eye on his parents.  He had been around sick patients in the hospital.    He still using and benefitting form his oxygen.  He is frustrated by the company who provides his oxygen because he says their service is lousy.  He is working with [employer].    Past Medical History:  Diagnosis Date  . COPD (chronic obstructive pulmonary disease) ([place])   . Hypertension   . Osteoarthrosis   . Varicose veins       Review of Systems  Constitutional: Positive for fatigue. Negative for chills and fever.  HENT: Negative for rhinorrhea and sinus pressure.   Respiratory: Positive for cough and shortness of breath. Negative for wheezing.   Cardiovascular: Negative for chest pain, palpitations and leg swelling.       Objective:   Physical Exam Vitals:   09/05/17 0943 09/05/17 0947  BP: 114/62 114/62  Pulse: 98 60  SpO2: (!) 60% 98%  Weight: 204 lb (92.5 kg) 204 lb (92.5 kg)  Height: 5\' 8"  (1.727 m) 5\' 8"  (1.727 m)   2L  Gen: well appearing HENT: OP clear, TM's clear, neck supple PULM: CTA B, normal percussion CV: RRR, no mgr, trace edema GI: BS+, soft, nontender Derm: no cyanosis or rash Psyche: normal mood and affect   Sleep study: 02/28/2017 AHI 0.0, O2 saturation nadir 88% on 2L [place]  Overnight oximetry: 01/18/2012 ONO RA normal  PFT: Arlyce Harman 10/08/12: FeV1 29%  FeV1/FVC 41% Spiro 04/10/2013>> FeV1 26% FVC 56% FeV1/FVC 53%  PFT 01/2017: Ration 41% FEV1 0.88L (28% pred), FVC 2.15 L (52% pred)< DLCO 11.35 (40% pred)  Labs: Alpha one antitrypsin 04/10/2013>>normal 145  Micro: 01/2017 Sputum AFB culture negative    CBC    Component Value Date/Time   WBC 6.7 10/23/2016 0954   RBC 5.27 10/23/2016 0954   HGB 15.7 10/23/2016 0954   HCT 46.8 10/23/2016 0954   PLT 268.0 10/23/2016 0954   MCV 88.8 10/23/2016 0954   MCH 31.6 04/28/2010 1621   MCHC 33.5 10/23/2016 0954   RDW 14.2 10/23/2016 0954   LYMPHSABS 1.1 10/23/2016 0954   MONOABS 0.6 10/23/2016 0954   EOSABS 0.1 10/23/2016 0954   BASOSABS 0.0 10/23/2016 0954    BMET    Component Value Date/Time   NA 140 04/28/2010 1644   K 5.2 (H) 04/28/2010 1644   CL 105 04/28/2010 1644   GLUCOSE 80 04/28/2010 1644   BUN 17 04/28/2010 1644   CREATININE 1.0 04/28/2010 1644          Assessment & Plan:   Chronic obstructive pulmonary disease, unspecified COPD type ([place])  Chronic respiratory failure with hypoxia ([place])  Discussion: This has been a stable interval for Gilbert Jefferson.  He has not had an exacerbation  of his COPD since the last visit.  He says that he had a mild exacerbation a few weeks ago and the prednisone we called and was helpful.  He is compliant with his oxygen and his Trelegy.  I am frustrated for him that he has been struggling with his oxygen machine.  We will check today when he is walking to make sure that it is applying an adequate amount of oxygen.  I think he should probably change to [employer] from [place].  Plan: Severe COPD: Continue Trelegy 1 puff daily Use albuterol as needed for chest tightness wheezing or shortness of breath Stay active, exercise regularly  Chronic respiratory failure with hypoxemia: Continue 2 L of oxygen at rest, 3 L with exercise We will check your oxygen machine when you are walking today to make sure that it is supplying ample amount of  oxygen  Get a flu shot in the fall  We will see you back in 3 to 4 months or sooner if needed   Current Outpatient Medications:  .  albuterol (PROAIR HFA) 108 (90 Base) MCG/ACT inhaler, Inhale 1-2 puffs into the lungs every 6 (six) hours as needed for shortness of breath., Disp: 3 Inhaler, Rfl: 3 .  albuterol (PROVENTIL) (2.5 MG/3ML) 0.083% nebulizer solution, Take 3 mLs (2.5 mg total) by nebulization every 6 (six) hours as needed for wheezing or shortness of breath., Disp: 360 mL, Rfl: 11 .  busPIRone (BUSPAR) 15 MG tablet, Take 0.5 tablets (7.5 mg total) by mouth 2 (two) times daily., Disp: 90 tablet, Rfl: 3 .  OXYGEN, Oxygen 2.5 lpm with sleep and as needed during the day, 3lpm with exertion, Disp: , Rfl:  .  Respiratory Therapy Supplies (FLUTTER) DEVI, 1 Device by Does not apply route as needed., Disp: 1 each, Rfl: 0 .  tamsulosin (FLOMAX) 0.4 MG CAPS capsule, Take 0.4 mg by mouth daily., Disp: , Rfl:  .  TRELEGY ELLIPTA 100-62.5-25 MCG/INH AEPB, USE 1 PUFF INTO LUNGS ONCE A DAY, Disp: 60 each, Rfl: 5

## 2017-09-05 NOTE — Patient Instructions (Signed)
Severe COPD: Continue Trelegy 1 puff daily Use albuterol as needed for chest tightness wheezing or shortness of breath Stay active, exercise regularly  Chronic respiratory failure with hypoxemia: Continue 2 L of oxygen at rest, 3 L with exercise We will check your oxygen machine when you are walking today to make sure that it is supplying ample amount of oxygen  Get a flu shot in the fall  We will see you back in 3 to 4 months or sooner if needed

## 2017-09-05 NOTE — Addendum Note (Signed)
Addended by: Dolores Lory on: 09/05/2017 10:25 AM   Modules accepted: Orders

## 2017-09-09 DIAGNOSIS — I8393 Asymptomatic varicose veins of bilateral lower extremities: Secondary | ICD-10-CM | POA: Diagnosis not present

## 2017-09-09 DIAGNOSIS — I10 Essential (primary) hypertension: Secondary | ICD-10-CM | POA: Diagnosis not present

## 2017-09-09 DIAGNOSIS — F4323 Adjustment disorder with mixed anxiety and depressed mood: Secondary | ICD-10-CM | POA: Diagnosis not present

## 2017-09-09 DIAGNOSIS — R252 Cramp and spasm: Secondary | ICD-10-CM | POA: Diagnosis not present

## 2017-10-14 DIAGNOSIS — Z0000 Encounter for general adult medical examination without abnormal findings: Secondary | ICD-10-CM | POA: Diagnosis not present

## 2017-10-14 DIAGNOSIS — Z1211 Encounter for screening for malignant neoplasm of colon: Secondary | ICD-10-CM | POA: Diagnosis not present

## 2017-10-14 DIAGNOSIS — N400 Enlarged prostate without lower urinary tract symptoms: Secondary | ICD-10-CM | POA: Diagnosis not present

## 2017-10-14 DIAGNOSIS — R972 Elevated prostate specific antigen [PSA]: Secondary | ICD-10-CM | POA: Diagnosis not present

## 2017-10-14 DIAGNOSIS — I10 Essential (primary) hypertension: Secondary | ICD-10-CM | POA: Diagnosis not present

## 2017-10-14 DIAGNOSIS — J9611 Chronic respiratory failure with hypoxia: Secondary | ICD-10-CM | POA: Diagnosis not present

## 2017-10-16 DIAGNOSIS — N138 Other obstructive and reflux uropathy: Secondary | ICD-10-CM | POA: Diagnosis not present

## 2017-10-16 DIAGNOSIS — R972 Elevated prostate specific antigen [PSA]: Secondary | ICD-10-CM | POA: Diagnosis not present

## 2017-10-16 DIAGNOSIS — N400 Enlarged prostate without lower urinary tract symptoms: Secondary | ICD-10-CM | POA: Diagnosis not present

## 2017-10-16 DIAGNOSIS — I10 Essential (primary) hypertension: Secondary | ICD-10-CM | POA: Diagnosis not present

## 2017-10-25 DIAGNOSIS — R972 Elevated prostate specific antigen [PSA]: Secondary | ICD-10-CM | POA: Diagnosis not present

## 2017-10-25 DIAGNOSIS — N529 Male erectile dysfunction, unspecified: Secondary | ICD-10-CM | POA: Diagnosis not present

## 2017-10-25 DIAGNOSIS — N400 Enlarged prostate without lower urinary tract symptoms: Secondary | ICD-10-CM | POA: Diagnosis not present

## 2017-12-13 ENCOUNTER — Other Ambulatory Visit: Payer: Self-pay | Admitting: Pulmonary Disease

## 2017-12-16 DIAGNOSIS — Z86010 Personal history of colonic polyps: Secondary | ICD-10-CM | POA: Diagnosis not present

## 2017-12-16 DIAGNOSIS — K648 Other hemorrhoids: Secondary | ICD-10-CM | POA: Diagnosis not present

## 2017-12-16 DIAGNOSIS — D125 Benign neoplasm of sigmoid colon: Secondary | ICD-10-CM | POA: Diagnosis not present

## 2017-12-16 DIAGNOSIS — K635 Polyp of colon: Secondary | ICD-10-CM | POA: Diagnosis not present

## 2017-12-16 DIAGNOSIS — K649 Unspecified hemorrhoids: Secondary | ICD-10-CM | POA: Diagnosis not present

## 2017-12-16 DIAGNOSIS — Z1211 Encounter for screening for malignant neoplasm of colon: Secondary | ICD-10-CM | POA: Diagnosis not present

## 2018-01-02 ENCOUNTER — Telehealth: Payer: Self-pay | Admitting: Pulmonary Disease

## 2018-01-02 NOTE — Telephone Encounter (Signed)
[facility] requesting 02/21/17 office visit notes to be faxed to below verified fax # at [facility] attention.  This has been sent.  Nothing further needed.

## 2018-02-03 ENCOUNTER — Telehealth: Payer: Self-pay | Admitting: Pulmonary Disease

## 2018-02-03 MED ORDER — ALBUTEROL SULFATE (2.5 MG/3ML) 0.083% IN NEBU: 2.5000 mg | INHALATION_SOLUTION | Freq: Four times a day (QID) | RESPIRATORY_TRACT | 11 refills | Status: DC | PRN

## 2018-02-03 MED ORDER — PREDNISONE 10 MG PO TABS: 20.0000 mg | ORAL_TABLET | Freq: Every day | ORAL | 0 refills | Status: DC

## 2018-02-03 MED ORDER — DOXYCYCLINE HYCLATE 100 MG PO TABS: 100.0000 mg | ORAL_TABLET | Freq: Two times a day (BID) | ORAL | 0 refills | Status: DC

## 2018-02-03 NOTE — Telephone Encounter (Signed)
Spoke with pt, c/o increased prod cough with green mucus, increased sob, sinus congestion, pnd X1 week. Denies fever, chest pain, body aches.  Has been taking mucinex to help with s/s, requesting additional recs.  Pt also needs refill on albuterol neb solution.   Pharmacy: [facility] [place].    Sending to [place] as BQ is not in the office today.  Please advise on recs.  Thanks!

## 2018-02-03 NOTE — Telephone Encounter (Signed)
Called and spoke with pt. Asked pt how often has he had to use the albuterol inhaler and the neb sol. Pt stated to me he uses the albuterol inhaler about twice over night but with the neb sol, he had not been using it and stated since he became sick, he was going to use it but noticed the Rx was expired.  Stated to pt that we were going to refill his albuterol neb sol and also stated to him that we could send in an abx of doxy and pred Rx if he wanted [place] to. Pt expressed understanding and stated he would like to have the abx and pred Rx to be sent in. I verified pt's preferred pharmacy and have sent meds in for pt.  Nothing further needed.

## 2018-02-03 NOTE — Telephone Encounter (Signed)
Okay to refill albuterol neb solution.  How often is he been using the albuterol?Ensure patient has Trelegy Ellipta and has been using this. Can offer:Prednisone 10mg  tablet  >>>Take 2 tablets (20 mg total) daily for the next 5 days >>> Take with food in the morning  Doxycycline >>> 1 100 mg tablet every 12 hours for 5 days >>>take with food  >>>wear sunscreen   Please place the order.  Patient needs to keep January/14/2020 appointment with Dr. Lake Bells.  If patient symptoms worsen he will need an office visit with a chest x-ray before.Wyn Quaker, FNP

## 2018-02-03 NOTE — Telephone Encounter (Signed)
lmtcb for pt.   rx's pended, can be sent once we speak to pt about recs.

## 2018-02-03 NOTE — Telephone Encounter (Signed)
Pt is calling back [phone]

## 2018-02-11 ENCOUNTER — Ambulatory Visit (INDEPENDENT_AMBULATORY_CARE_PROVIDER_SITE_OTHER): Payer: Medicare Other | Admitting: Pulmonary Disease

## 2018-02-11 ENCOUNTER — Encounter: Payer: Self-pay | Admitting: Pulmonary Disease

## 2018-02-11 ENCOUNTER — Telehealth: Payer: Self-pay | Admitting: Pulmonary Disease

## 2018-02-11 ENCOUNTER — Ambulatory Visit (INDEPENDENT_AMBULATORY_CARE_PROVIDER_SITE_OTHER)
Admission: RE | Admit: 2018-02-11 | Discharge: 2018-02-11 | Disposition: A | Discharge status: Home / Self Care | Payer: Medicare Other | Source: Ambulatory Visit | Attending: Pulmonary Disease | Admitting: Pulmonary Disease

## 2018-02-11 VITALS — BP 144/70 | HR 69 | Ht 68.0 in | Wt 216.0 lb

## 2018-02-11 DIAGNOSIS — A159 Respiratory tuberculosis unspecified: Secondary | ICD-10-CM

## 2018-02-11 DIAGNOSIS — J449 Chronic obstructive pulmonary disease, unspecified: Secondary | ICD-10-CM

## 2018-02-11 DIAGNOSIS — J9611 Chronic respiratory failure with hypoxia: Secondary | ICD-10-CM

## 2018-02-11 DIAGNOSIS — J439 Emphysema, unspecified: Secondary | ICD-10-CM | POA: Diagnosis not present

## 2018-02-11 DIAGNOSIS — R0600 Dyspnea, unspecified: Secondary | ICD-10-CM

## 2018-02-11 DIAGNOSIS — Z23 Encounter for immunization: Secondary | ICD-10-CM

## 2018-02-11 MED ORDER — PREDNISONE 20 MG PO TABS: 20.0000 mg | ORAL_TABLET | Freq: Every day | ORAL | 0 refills | Status: DC

## 2018-02-11 NOTE — Addendum Note (Signed)
Addended by: Len Blalock on: 02/11/2018 12:16 PM   Modules accepted: Orders

## 2018-02-11 NOTE — Patient Instructions (Signed)
Severe COPD with acute exacerbation Take prednisone 20 mg daily x5 days Use albuterol nebulizer at least twice a day for the next 4 to 5 days Continue taking Trelegy 1 puff daily High-dose flu shot today Practice good hand hygiene Stay active  Chronic respiratory failure with hypoxemia: Use oxygen continuously  Physical deconditioning/weight gain: Its very important that she start exercising again, I recommend that she start at 50% of your peak exercise capacity from years past and then gradually work your way up.  Remember that consistency matters more than intensity but intensity should gradually increase over time.  We will see you back in 2 to 3 months or sooner if needed

## 2018-02-11 NOTE — Progress Notes (Signed)
Subjective:    Patient ID: Gilbert Jefferson, male    DOB: May 13, 1950, 68 y.o.   MRN: [id number]  Synopsis: Former patient of Dr. Joya Jefferson with advanced COPD on 2L O2 who was diagnosed with TB in April 2018; completed treatment 11/16/2016    HPI Chief Complaint  Patient presents with  . Follow-up    pt states he feels better since completing pred and abx prescribed last week, but is still not at baseline.  increased sob, prod cough with clear mucus.    Gilbert Jefferson was coughing up dark green mucus two weeks ago.  He had some chest congestion and sinus congestion.  He feels better now but he is still short of breath.  He has been using his nebulizer twice a week.  He notes that his dyspnea is worse than a few months ago.  He stopped exercising a few months ago and has gained weight.  He still feels chest congestion and will still produce thick clear mucus. He has not had his flu shot.     Past Medical History:  Diagnosis Date  . COPD (chronic obstructive pulmonary disease) ([place])   . Hypertension   . Osteoarthrosis   . Varicose veins       Review of Systems  Constitutional: Positive for fatigue. Negative for chills and fever.  HENT: Negative for rhinorrhea and sinus pressure.   Respiratory: Positive for cough and shortness of breath. Negative for wheezing.   Cardiovascular: Negative for chest pain, palpitations and leg swelling.       Objective:   Physical Exam Vitals:   02/11/18 1138  BP: (!) 144/70  Pulse: 69  SpO2: 93%  Weight: 216 lb (98 kg)  Height: 5\' 8"  (1.727 m)   2L  Gen: well appearing HENT: OP clear,  neck supple PULM: Poor air movement B, normal percussion CV: RRR, no mgr, trace edema GI: BS+, soft, nontender Derm: no cyanosis or rash Psyche: normal mood and affect   Sleep study: 02/28/2017 AHI 0.0, O2 saturation nadir 88% on 2L [place]  Overnight oximetry: 01/18/2012 ONO RA normal  PFT: Arlyce Harman 10/08/12: FeV1 29% FeV1/FVC 41% Spiro 04/10/2013>> FeV1 26% FVC 56%  FeV1/FVC 53%  PFT 01/2017: Ration 41% FEV1 0.88L (28% pred), FVC 2.15 L (52% pred)< DLCO 11.35 (40% pred)  Labs: Alpha one antitrypsin 04/10/2013>>normal 145  Micro: 01/2017 Sputum AFB culture negative    CBC    Component Value Date/Time   WBC 6.7 10/23/2016 0954   RBC 5.27 10/23/2016 0954   HGB 15.7 10/23/2016 0954   HCT 46.8 10/23/2016 0954   PLT 268.0 10/23/2016 0954   MCV 88.8 10/23/2016 0954   MCH 31.6 04/28/2010 1621   MCHC 33.5 10/23/2016 0954   RDW 14.2 10/23/2016 0954   LYMPHSABS 1.1 10/23/2016 0954   MONOABS 0.6 10/23/2016 0954   EOSABS 0.1 10/23/2016 0954   BASOSABS 0.0 10/23/2016 0954    BMET    Component Value Date/Time   NA 140 04/28/2010 1644   K 5.2 (H) 04/28/2010 1644   CL 105 04/28/2010 1644   GLUCOSE 80 04/28/2010 1644   BUN 17 04/28/2010 1644   CREATININE 1.0 04/28/2010 1644          Assessment & Plan:   Chronic obstructive pulmonary disease, unspecified COPD type ([place])  Chronic respiratory failure with hypoxia (HCC)  Mycobacterium tuberculosis infection  Discussion: Unfortunately Gilbert Jefferson is still suffering from a slow to resolve acute exacerbation of his very severe COPD.  He is still quite symptomatic  from this.  He needs to have more prednisone and he needs to use albuterol more frequently.  Complicating matters: He has gained a significant amount of weight and stopped exercising so he is now much more symptomatic related to this.  Plan: Severe COPD with acute exacerbation Take prednisone 20 mg daily x5 days Use albuterol nebulizer at least twice a day for the next 4 to 5 days Continue taking Trelegy 1 puff daily High-dose flu shot today Practice good hand hygiene Stay active Check CXR  Chronic respiratory failure with hypoxemia: Use oxygen continuously  History of TB: CXR today  Physical deconditioning/weight gain: Its very important that she start exercising again, I recommend that she start at 50% of your peak exercise  capacity from years past and then gradually work your way up.  Remember that consistency matters more than intensity but intensity should gradually increase over time.  We will see you back in 2 to 3 months or sooner if needed    Current Outpatient Medications:  .  albuterol (PROAIR HFA) 108 (90 Base) MCG/ACT inhaler, Inhale 1-2 puffs into the lungs every 6 (six) hours as needed for shortness of breath., Disp: 3 Inhaler, Rfl: 3 .  albuterol (PROVENTIL) (2.5 MG/3ML) 0.083% nebulizer solution, Take 3 mLs (2.5 mg total) by nebulization every 6 (six) hours as needed for wheezing or shortness of breath., Disp: 360 mL, Rfl: 11 .  busPIRone (BUSPAR) 15 MG tablet, Take 0.5 tablets (7.5 mg total) by mouth 2 (two) times daily., Disp: 90 tablet, Rfl: 3 .  Fluticasone-Umeclidin-Vilant (TRELEGY ELLIPTA) 100-62.5-25 MCG/INH AEPB, Inhale 1 puff into the lungs daily., Disp: 2 each, Rfl: 0 .  OXYGEN, Oxygen 2.5 lpm with sleep and as needed during the day, 3lpm with exertion, Disp: , Rfl:  .  Respiratory Therapy Supplies (FLUTTER) DEVI, 1 Device by Does not apply route as needed., Disp: 1 each, Rfl: 0 .  TRELEGY ELLIPTA 100-62.5-25 MCG/INH AEPB, USE 1 PUFF INTO LUNGS ONCE A DAY, Disp: 60 each, Rfl: 5

## 2018-02-11 NOTE — Telephone Encounter (Signed)
Spoke with pt. At his appointment today with BQ, he wanted the pt to have Prednisone 20mg  x5 days. This was not sent in at his appointment. Rx has been sent in. Nothing further was needed.

## 2018-02-12 NOTE — Progress Notes (Signed)
LMTCB

## 2018-03-05 DIAGNOSIS — H11153 Pinguecula, bilateral: Secondary | ICD-10-CM | POA: Diagnosis not present

## 2018-05-13 ENCOUNTER — Ambulatory Visit: Payer: Medicare Other | Admitting: Pulmonary Disease

## 2018-05-21 ENCOUNTER — Ambulatory Visit: Payer: Medicare Other | Admitting: Pulmonary Disease

## 2018-05-26 NOTE — Progress Notes (Signed)
@Patient  ID: Gilbert Maes., male    DOB: 07/29/1950, 68 y.o.   MRN: [id number]  Chief Complaint  Patient presents with  . Follow-up    COPD     Referring provider: Maylon Peppers, MD  HPI:  68 year old male former smoker followed in our office for COPD GOLD IV and Chronic Resp Failure   PMH: TB in April 2018; completed treatment 11/16/2016 Smoker/ Smoking History:  Former Smoker. Quit 2013. 67.5 pack years.  Maintenance:  Trelegy Ellipta  Pt of: Dr. Lake Bells  05/27/2018  - Visit   68 year old male former smoker followed in our office for COPD Gold 4 (based on 2019 pulmonary function testing).  Patient reports that he has been doing well since last office visit.  He has not needed prednisone or antibiotics since last office visit for an exacerbation.  Patient reports he is about to start amoxicillin but this is for a probable tooth abscess as he is waiting to be seen by his dentist next week.  Patient continues to be maintained on Trelegy Ellipta which he likes taking.  The only concern is that Trelegy Ellipta typically places the patient in the donut hole.  He is requesting samples today if we have any.  Patient using his Ventolin rescue inhaler 1 puff at night when he gets up to go the bathroom and may be 1 to 2 puffs weekly during the day.  He reports that this is his baseline he feels this is stable for his breathing.   After last office visit patient started exercising 4-5 times a week at the [facility].  He was making great progress with his physical activity until COVID-19 restrictions close to his gym.  He does admit that over the last 4 to 6 weeks he has not been as active.  Patient denies any body aches, chills, fever, cough, congestion or wheezing.  MMRC - Breathlessness Score 2 - on level ground, I walk slower than people of the same age because of breathlessness, or have to stop for breathe when walking to my own pace     Tests:   Sleep study: 02/28/2017 AHI 0.0, O2  saturation nadir 88% on 2L [place]  Overnight oximetry: 01/18/2012 ONO RA normal  PFT: Arlyce Harman 10/08/12: FeV1 29% FeV1/FVC 41% Spiro 04/10/2013>> FeV1 26% FVC 56% FeV1/FVC 53%  PFT 01/2017: Ration 41% FEV1 0.88L (28% pred), FVC 2.15 L (52% pred)< DLCO 11.35 (40% pred)  Labs: Alpha one antitrypsin 04/10/2013>>normal 145  Micro: 01/2017 Sputum AFB culture negative  FENO:  No results found for: NITRICOXIDE  PFT: PFT Results Latest Ref Rng & Units 02/25/2017  FVC-Pre L 2.14  FVC-Predicted Pre % 52  FVC-Post L 2.15  FVC-Predicted Post % 52  Pre FEV1/FVC % % 41  Post FEV1/FCV % % 41  FEV1-Pre L 0.88  FEV1-Predicted Pre % 28  FEV1-Post L 0.88  DLCO UNC% % 40  DLCO COR %Predicted % 54    Imaging: No results found.    Specialty Problems      Pulmonary Problems   COPD (chronic obstructive pulmonary disease) Gold D    Copd gold D  01/18/2012 ONO RA normal Spiro 10/08/12: FeV1 29% FeV1/FVC 41% Alpha one antitrypsin 04/10/2013>>normal 145 Spiro 04/10/2013>> FeV1 26% FVC 56% FeV1/FVC 53%        Chronic respiratory failure with hypoxia (HCC)    Exertional hypoxemia 04/10/2013         Allergies  Allergen Reactions  . Azithromycin     Wife  noted memory loss when patient was on this chronically    Immunization History  Administered Date(s) Administered  . Influenza Split 01/10/2012  . Influenza, High Dose Seasonal PF 10/23/2016, 02/11/2018  . Influenza,inj,Quad PF,6+ Mos 10/08/2012, 10/23/2013, 10/19/2014, 11/28/2015  . Influenza-Unspecified 11/11/2015  . Pneumococcal Conjugate-13 12/21/2013  . Pneumococcal Polysaccharide-23 01/09/2012  . Zoster 05/27/2015    Past Medical History:  Diagnosis Date  . COPD (chronic obstructive pulmonary disease) ([place])   . Hypertension   . Osteoarthrosis   . Varicose veins     Tobacco History: Social History   Tobacco Use  Smoking Status Former Smoker  . Packs/day: 1.50  . Years: 45.00  . Pack years: 67.50  . Types: Cigarettes   . Last attempt to quit: 12/31/2011  . Years since quitting: 6.4  Smokeless Tobacco Never Used   Counseling given: Yes  Continue to not smoke   Outpatient Encounter Medications as of 05/27/2018  Medication Sig  . albuterol (PROVENTIL) (2.5 MG/3ML) 0.083% nebulizer solution Take 3 mLs (2.5 mg total) by nebulization every 6 (six) hours as needed for wheezing or shortness of breath.  Marland Kitchen albuterol (VENTOLIN HFA) 108 (90 Base) MCG/ACT inhaler Inhale 1 puff into the lungs every 6 (six) hours as needed for wheezing or shortness of breath.  . busPIRone (BUSPAR) 15 MG tablet Take 0.5 tablets (7.5 mg total) by mouth 2 (two) times daily.  . OXYGEN Oxygen 2.5 lpm with sleep and as needed during the day, 3lpm with exertion  . Respiratory Therapy Supplies (FLUTTER) DEVI 1 Device by Does not apply route as needed.  . TRELEGY ELLIPTA 100-62.5-25 MCG/INH AEPB USE 1 PUFF INTO LUNGS ONCE A DAY  . [DISCONTINUED] albuterol (VENTOLIN HFA) 108 (90 Base) MCG/ACT inhaler Inhale 1 puff into the lungs every 6 (six) hours as needed for wheezing or shortness of breath.  . [DISCONTINUED] Fluticasone-Umeclidin-Vilant (TRELEGY ELLIPTA) 100-62.5-25 MCG/INH AEPB Inhale 1 puff into the lungs daily.  Marland Kitchen albuterol (PROAIR HFA) 108 (90 Base) MCG/ACT inhaler Inhale 1-2 puffs into the lungs every 6 (six) hours as needed for shortness of breath. (Patient not taking: Reported on 05/27/2018)  . Fluticasone-Umeclidin-Vilant (TRELEGY ELLIPTA) 100-62.5-25 MCG/INH AEPB Inhale 1 puff into the lungs daily.  . [DISCONTINUED] predniSONE (DELTASONE) 20 MG tablet Take 1 tablet (20 mg total) by mouth daily with breakfast. (Patient not taking: Reported on 05/27/2018)   No facility-administered encounter medications on file as of 05/27/2018.      Review of Systems  Review of Systems  Constitutional: Positive for fatigue. Negative for activity change, chills, fever and unexpected weight change.  HENT: Negative for congestion (w), postnasal drip,  rhinorrhea, sneezing and sore throat.   Eyes: Negative.   Respiratory: Positive for shortness of breath (esp at night ). Negative for cough and wheezing.   Cardiovascular: Negative for chest pain, palpitations and leg swelling.  Gastrointestinal: Negative for diarrhea, nausea and vomiting.  Endocrine: Negative.   Musculoskeletal: Negative.   Skin: Negative.   Neurological: Negative for dizziness and headaches.  Psychiatric/Behavioral: Negative.  Negative for dysphoric mood. The patient is not nervous/anxious.   All other systems reviewed and are negative.    Physical Exam  BP 132/70 (BP Location: Left Arm, Cuff Size: Normal)   Pulse (!) 108   Ht 5\' 7"  (1.702 m)   Wt 214 lb 6.4 oz (97.3 kg)   SpO2 93%   BMI 33.58 kg/m   Wt Readings from Last 5 Encounters:  05/27/18 214 lb 6.4 oz (97.3 kg)  02/11/18  216 lb (98 kg)  09/05/17 204 lb (92.5 kg)  04/09/17 214 lb (97.1 kg)  02/28/17 214 lb (97.1 kg)     Physical Exam  Constitutional: He is oriented to person, place, and time and well-developed, well-nourished, and in no distress. No distress.  Obese adult male  HENT:  Head: Normocephalic and atraumatic.  Right Ear: Hearing, tympanic membrane, external ear and ear canal normal.  Left Ear: Hearing, tympanic membrane, external ear and ear canal normal.  Nose: No mucosal edema or rhinorrhea. Right sinus exhibits no maxillary sinus tenderness and no frontal sinus tenderness. Left sinus exhibits no maxillary sinus tenderness and no frontal sinus tenderness.  Mouth/Throat: Uvula is midline and oropharynx is clear and moist. No oropharyngeal exudate.  Eyes: Pupils are equal, round, and reactive to light.  Neck: Normal range of motion. Neck supple.  Cardiovascular: Normal rate, regular rhythm and normal heart sounds.  Pulmonary/Chest: Effort normal and breath sounds normal. No accessory muscle usage. No respiratory distress. He has no decreased breath sounds. He has no wheezes. He has no  rhonchi.  Abdominal: Soft. Bowel sounds are normal. He exhibits no distension. There is no abdominal tenderness.  Obese   Musculoskeletal: Normal range of motion.        General: No edema.  Lymphadenopathy:    He has no cervical adenopathy.  Neurological: He is alert and oriented to person, place, and time. Gait normal.  Skin: Skin is warm and dry. He is not diaphoretic. No erythema.  Psychiatric: Mood, memory, affect and judgment normal.  Nursing note and vitals reviewed.     Lab Results:  CBC    Component Value Date/Time   WBC 6.7 10/23/2016 0954   RBC 5.27 10/23/2016 0954   HGB 15.7 10/23/2016 0954   HCT 46.8 10/23/2016 0954   PLT 268.0 10/23/2016 0954   MCV 88.8 10/23/2016 0954   MCH 31.6 04/28/2010 1621   MCHC 33.5 10/23/2016 0954   RDW 14.2 10/23/2016 0954   LYMPHSABS 1.1 10/23/2016 0954   MONOABS 0.6 10/23/2016 0954   EOSABS 0.1 10/23/2016 0954   BASOSABS 0.0 10/23/2016 0954    BMET    Component Value Date/Time   NA 140 04/28/2010 1644   K 5.2 (H) 04/28/2010 1644   CL 105 04/28/2010 1644   GLUCOSE 80 04/28/2010 1644   BUN 17 04/28/2010 1644   CREATININE 1.0 04/28/2010 1644    BNP No results found for: BNP  ProBNP No results found for: PROBNP    Assessment & Plan:   COPD (chronic obstructive pulmonary disease) Gold D Assessment: COPD Gold 4 mMRC 2 Lungs clear to auscultation today  Plan: Continue Trelegy Ellipta Continue oxygen therapy as prescribed Continue Ventolin HFA Refilled Ventolin Trelegy Ellipta samples provided today Follow-up in 6 months Continue to work on physical exercise daily and home Resume daily physical exercise at the [facility] after COVID-19 restrictions are lifted  Morbid obesity due to excess calories (HCC) Assessment: BMI 33.6  Plan: Work on physical activity in your home during COVID-19 pandemic  Chronic respiratory failure with hypoxia ([place]) Assessment: Maintained on 2 L 24/7  Plan: Continue oxygen therapy  as prescribed Monitor oxygen saturations to ensure they are greater than 88% Contact our office if oxygen saturations are below 88% with oxygen on   Lauraine Rinne, NP 05/27/2018   This appointment was 34 min long with over 50% of the time in direct face-to-face patient care, assessment, plan of care, and follow-up.

## 2018-05-27 ENCOUNTER — Other Ambulatory Visit: Payer: Self-pay

## 2018-05-27 ENCOUNTER — Ambulatory Visit (INDEPENDENT_AMBULATORY_CARE_PROVIDER_SITE_OTHER): Payer: Medicare Other | Admitting: Pulmonary Disease

## 2018-05-27 ENCOUNTER — Encounter: Payer: Self-pay | Admitting: Pulmonary Disease

## 2018-05-27 DIAGNOSIS — J9611 Chronic respiratory failure with hypoxia: Secondary | ICD-10-CM | POA: Diagnosis not present

## 2018-05-27 DIAGNOSIS — J42 Unspecified chronic bronchitis: Secondary | ICD-10-CM | POA: Diagnosis not present

## 2018-05-27 MED ORDER — ALBUTEROL SULFATE HFA 108 (90 BASE) MCG/ACT IN AERS: 1.0000 | INHALATION_SPRAY | Freq: Four times a day (QID) | RESPIRATORY_TRACT | 4 refills | Status: DC | PRN

## 2018-05-27 MED ORDER — FLUTICASONE-UMECLIDIN-VILANT 100-62.5-25 MCG/INH IN AEPB: 1.0000 | INHALATION_SPRAY | Freq: Every day | RESPIRATORY_TRACT | 0 refills | Status: DC

## 2018-05-27 NOTE — Assessment & Plan Note (Signed)
Assessment: Maintained on 2 L 24/7  Plan: Continue oxygen therapy as prescribed Monitor oxygen saturations to ensure they are greater than 88% Contact our office if oxygen saturations are below 88% with oxygen on

## 2018-05-27 NOTE — Assessment & Plan Note (Signed)
Assessment: COPD Gold 4 mMRC 2 Lungs clear to auscultation today  Plan: Continue Trelegy Ellipta Continue oxygen therapy as prescribed Continue Ventolin HFA Refilled Ventolin Trelegy Ellipta samples provided today Follow-up in 6 months Continue to work on physical exercise daily and home Resume daily physical exercise at the [facility] after COVID-19 restrictions are lifted

## 2018-05-27 NOTE — Assessment & Plan Note (Signed)
Assessment: BMI 33.6  Plan: Work on physical activity in your home during COVID-19 pandemic

## 2018-05-27 NOTE — Patient Instructions (Addendum)
Continue Trelegy Ellipta  >>> 1 puff daily in the morning >>>rinse mouth out after use  >>> This inhaler contains 3 medications that help manage her respiratory status, contact our office if you cannot afford this medication or unable to remain on this medication  Please contact our office if you are running low on Trelegy Ellipta or cannot afford this at the pharmacy If you spend over $600 out of pocket on your medication she may qualify for [place] for you which is a program from the pharmaceutical company that makes Trelegy Ellipta to get you Trelegy Ellipta for $0.  Only use your albuterol as a rescue medication to be used if you can't catch your breath by resting or doing a relaxed purse lip breathing pattern.  - The less you use it, the better it will work when you need it. - Ok to use up to 2 puffs  every 4 hours if you must but call for immediate appointment if use goes up over your usual need - Don't leave home without it !!  (think of it like the spare tire for your car)   Continue to work on physical activity and exercise I encourage you to start walking in your house 10 minutes twice a day and gradually increase 5 minutes each week Do arm and leg exercises while watching TV and when you are sitting  Resume daily physical activity at the [facility] when COVID-19 restrictions are lifted and you feel safe and comfortable to do so  Continue to practice good hand hygiene Continue to wear a mask when outside   Continue oxygen therapy as prescribed  >>>maintain oxygen saturations greater than 88 percent  >>>if unable to maintain oxygen saturations please contact the office  >>>do not smoke with oxygen  >>>can use nasal saline gel or nasal saline rinses to moisturize nose if oxygen causes dryness  Handicap placard form signed today.  Return in about 6 months (around 11/26/2018), or if symptoms worsen or fail to improve, for Follow up with Dr. Lake Bells, Follow up with Wyn Quaker  FNP-C.   Coronavirus (COVID-19) Are you at risk?  Are you at risk for the Coronavirus (COVID-19)?  To be considered HIGH RISK for Coronavirus (COVID-19), you have to meet the following criteria:  . Traveled to [place], [place], [place], [place] or [place]; or in the [place] to [place], [place], [place], or [place]; and have fever, cough, and shortness of breath within the last 2 weeks of travel OR . Been in close contact with a person diagnosed with COVID-19 within the last 2 weeks and have fever, cough, and shortness of breath . IF YOU DO NOT MEET THESE CRITERIA, YOU ARE CONSIDERED LOW RISK FOR COVID-19.  What to do if you are HIGH RISK for COVID-19?  Marland Kitchen If you are having a medical emergency, call 911. . Seek medical care right away. Before you go to a doctor's office, urgent care or emergency department, call ahead and tell them about your recent travel, contact with someone diagnosed with COVID-19, and your symptoms. You should receive instructions from your physician's office regarding next steps of care.  . When you arrive at healthcare provider, tell the healthcare staff immediately you have returned from visiting [place], [place], [place], [place] or [place]; or traveled in the [place] to [place], [place], [place], or [place]; in the last two weeks or you have been in close contact with a person diagnosed with COVID-19 in the  last 2 weeks.   . Tell the health care staff about your symptoms: fever, cough and shortness of breath. . After you have been seen by a medical provider, you will be either: o Tested for (COVID-19) and discharged home on quarantine except to seek medical care if symptoms worsen, and asked to  - Stay home and avoid contact with others until you get your results (4-5 days)  - Avoid travel on public transportation if possible (such as bus, train, or airplane) or o Sent to the Emergency Department by EMS for evaluation, COVID-19 testing, and  possible admission depending on your condition and test results.  What to do if you are LOW RISK for COVID-19?  Reduce your risk of any infection by using the same precautions used for avoiding the common cold or flu:  Marland Kitchen Wash your hands often with soap and warm water for at least 20 seconds.  If soap and water are not readily available, use an alcohol-based hand sanitizer with at least 60% alcohol.  . If coughing or sneezing, cover your mouth and nose by coughing or sneezing into the elbow areas of your shirt or coat, into a tissue or into your sleeve (not your hands). . Avoid shaking hands with others and consider head nods or verbal greetings only. . Avoid touching your eyes, nose, or mouth with unwashed hands.  . Avoid close contact with people who are sick. . Avoid places or events with large numbers of people in one location, like concerts or sporting events. . Carefully consider travel plans you have or are making. . If you are planning any travel outside or inside the [place], visit the CDC's Travelers' Health webpage for the latest health notices. . If you have some symptoms but not all symptoms, continue to monitor at home and seek medical attention if your symptoms worsen. . If you are having a medical emergency, call 911.   [place] / e-Visit: [email or web address]         [facility]: [facility]: [phone]                   [facility]: [phone]           It is flu season:   >>> Best ways to protect herself from the flu: Receive the yearly flu vaccine, practice good hand hygiene washing with soap and also using hand sanitizer when available, eat a nutritious meals, get adequate rest, hydrate appropriately   Please contact the office if your symptoms worsen or you have concerns that you are not improving.   Thank  you for choosing [facility] for your healthcare, and for allowing [place] to partner with you on your healthcare journey. I am thankful to be able to provide care to you today.   Wyn Quaker FNP-C       Chronic Obstructive Pulmonary Disease Chronic obstructive pulmonary disease (COPD) is a long-term (chronic) lung problem. When you have COPD, it is hard for air to get in and out of your lungs. Usually the condition gets worse over time, and your lungs will never return to normal. There are things you can do to keep yourself as healthy as possible.  Your doctor may treat your condition with: ? Medicines. ? Oxygen. ? Lung surgery.  Your doctor may also recommend: ? Rehabilitation. This includes steps to make your body work better. It may involve a  team of specialists. ? Quitting smoking, if you smoke. ? Exercise and changes to your diet. ? Comfort measures (palliative care). Follow these instructions at home: Medicines  Take over-the-counter and prescription medicines only as told by your doctor.  Talk to your doctor before taking any cough or allergy medicines. You may need to avoid medicines that cause your lungs to be dry. Lifestyle  If you smoke, stop. Smoking makes the problem worse. If you need help quitting, ask your doctor.  Avoid being around things that make your breathing worse. This may include smoke, chemicals, and fumes.  Stay active, but remember to rest as well.  Learn and use tips on how to relax.  Make sure you get enough sleep. Most adults need at least 7 hours of sleep every night.  Eat healthy foods. Eat smaller meals more often. Rest before meals. Controlled breathing Learn and use tips on how to control your breathing as told by your doctor. Try:  Breathing in (inhaling) through your nose for 1 second. Then, pucker your lips and breath out (exhale) through your lips for 2 seconds.  Putting one hand on your belly (abdomen). Breathe in slowly  through your nose for 1 second. Your hand on your belly should move out. Pucker your lips and breathe out slowly through your lips. Your hand on your belly should move in as you breathe out.  Controlled coughing Learn and use controlled coughing to clear mucus from your lungs. Follow these steps: 1. Lean your head a little forward. 2. Breathe in deeply. 3. Try to hold your breath for 3 seconds. 4. Keep your mouth slightly open while coughing 2 times. 5. Spit any mucus out into a tissue. 6. Rest and do the steps again 1 or 2 times as needed. General instructions  Make sure you get all the shots (vaccines) that your doctor recommends. Ask your doctor about a flu shot and a pneumonia shot.  Use oxygen therapy and pulmonary rehabilitation if told by your doctor. If you need home oxygen therapy, ask your doctor if you should buy a tool to measure your oxygen level (oximeter).  Make a COPD action plan with your doctor. This helps you to know what to do if you feel worse than usual.  Manage any other conditions you have as told by your doctor.  Avoid going outside when it is very hot, cold, or humid.  Avoid people who have a sickness you can catch (contagious).  Keep all follow-up visits as told by your doctor. This is important. Contact a doctor if:  You cough up more mucus than usual.  There is a change in the color or thickness of the mucus.  It is harder to breathe than usual.  Your breathing is faster than usual.  You have trouble sleeping.  You need to use your medicines more often than usual.  You have trouble doing your normal activities such as getting dressed or walking around the house. Get help right away if:  You have shortness of breath while resting.  You have shortness of breath that stops you from: ? Being able to talk. ? Doing normal activities.  Your chest hurts for longer than 5 minutes.  Your skin color is more blue than usual.  Your pulse oximeter  shows that you have low oxygen for longer than 5 minutes.  You have a fever.  You feel too tired to breathe normally. Summary  Chronic obstructive pulmonary disease (COPD) is a long-term lung problem.  The way your lungs work will never return to normal. Usually the condition gets worse over time. There are things you can do to keep yourself as healthy as possible.  Take over-the-counter and prescription medicines only as told by your doctor.  If you smoke, stop. Smoking makes the problem worse. This information is not intended to replace advice given to you by your health care provider. Make sure you discuss any questions you have with your health care provider. Document Released: 07/04/2007 Document Revised: 02/20/2016 Document Reviewed: 02/20/2016 Elsevier Interactive Patient Education  2019 [employer].    Exercise for Older Adults Staying physically active is important as you age. The four types of exercises that are best for older adults are endurance, strength, balance, and flexibility. Contact your health care provider before you start any exercise routine. Ask your health care provider what activities are safe for you. What are the risks? Risks associated with exercising include:  Overdoing it. This may lead to sore muscles or fatigue.  Falls.  Injuries.  Dehydration. How to do these exercises Endurance exercises Endurance (aerobic) exercises raise your breathing rate and heart rate. Increasing your endurance helps you to do everyday tasks and stay healthy. By improving the health of your body system that includes your heart, lungs, and blood vessels (circulatory system), you may also delay or prevent diseases such as heart disease, diabetes, and bone loss (osteoporosis). Types of endurance exercises include:  Sports.  Indoor activities, such as using gym equipment, doing water aerobics, or dancing.  Outdoor activities, such as biking or jogging.  Tasks around the  house, such as gardening, yard work, and heavy household chores like cleaning.  Walking, such as hiking or walking around your neighborhood. When doing endurance exercises, make sure you:  Are aware of your surroundings.  Use safety equipment as directed.  Dress in layers when exercising outdoors.  Drink plenty of water to stay well hydrated. Build up endurance slowly. Start with 10 minutes at a time, and gradually build up to doing 30 minutes at a time. Unless your health care provider gave you different instructions, aim to exercise for a total of 150 minutes a week. Spread out that time so you are working on endurance on 3 or more days a week. Strength exercises Lifting, pulling, or pushing weights helps to strengthen muscles. Having stronger muscles makes it easier to do everyday activities, such as getting up from a chair, climbing stairs, carrying groceries, and playing with grandchildren. Strength exercises include arm and leg exercises that may be done:  With weights.  Without weights (using your own body weight).  With a resistance band. When doing strength exercises:  Move smoothly and steadily. Do not suddenly thrust or jerk the weights, the resistance band, or your body.  Start with no weights or with light weights, and gradually add more weight over time. Eventually, aim to use weights that are hard or very hard for you to lift. This means that you are able to do 8 repetitions with the weight, and the last few repetitions are very challenging.  Lift or push weights into position for 3 seconds, hold the position for 1 second, and then take 3 seconds to return to your starting position.  Breathe out (exhale) during difficult movements, like lifting or pushing weights. Breathe in (inhale) to relax your muscles before the next repetition.  Consider alternating arms or legs, especially when you first start strength exercises.  Expect some slight muscle soreness after each  session. Do strength exercises on 2 or more days a week, for 30 minutes at a time. Avoid exercising the same muscle groups two days in a row. For example, if you work on your leg muscles one day, work on your arm muscles the next day. When you can do two sets of 10-15 repetitions with a certain weight, increase the amount of weight. Balance Balance exercises can help to prevent falls. Balance exercises include:  Standing on one foot.  Heel-to-toe walk.  Balance walk.  Tai chi. Make sure you have something sturdy to hold onto while doing balance exercises, such as a sturdy chair. As your balance improves, challenge yourself by holding onto the chair with one hand instead of two, and then with no hands. Trying exercises with your eyes closed also challenges your balance, but be sure to have a sturdy surface (like a countertop) close by in case you need it. Do balance exercises as often as you want, or as often as directed by your health care provider. Strength exercises for the lower body also help to improve balance. Flexibility Flexibility exercises improve how far you can bend, straighten, move, or rotate parts of your body (range of motion). These exercises also help you to do everyday activities such as getting dressed or reaching for objects. Flexibility exercises include stretching different parts of the body, and they may be done in a standing or seated position or on the floor. When stretching, make sure you:  Keep a slight bend in your arms and legs. Avoid completely straightening ("locking") your joints.  Do not stretch so far that you feel pain. You should feel a mild stretching feeling. You may try stretching farther as you become more flexible over time.  Relax and breathe between stretches.  Hold onto something sturdy for balance as needed. Hold each stretch for 10-30 seconds. Repeat each stretch 3-5 times. General safety tips  Exercise in well-lit areas.  Do not hold your  breath during exercises or stretches.  Warm up before exercising, and cool down after exercising. This can help prevent injury.  Drink plenty of water during exercise or any activity that makes you sweat.  Use smooth, steady movements. Do not use sudden, jerking movements, especially when lifting weights or doing flexibility exercises.  If you are not sure if an exercise is safe for you, or you are not sure how to do an exercise, talk with your health care provider. This is especially important if you have had surgery on muscles, bones, or joints (orthopedic surgery). Where to find more information You can find more information about exercise for older adults from:  Your local health department, fitness center, or community center. These facilities may have programs for aging adults.  [employer] on Aging: http://kim-miller.com/  National Council on Aging: www.ncoa.org Summary  Staying physically active is important as you age.  Make sure to contact your health care provider before you start any exercise routine. Ask your health care provider what activities are safe for you.  Doing endurance, strength, balance, and flexibility exercises can help to delay or prevent certain diseases, such as heart disease, diabetes, and bone loss (osteoporosis). This information is not intended to replace advice given to you by your health care provider. Make sure you discuss any questions you have with your health care provider. Document Released: 06/06/2016 Document Revised: 06/06/2016 Document Reviewed: 06/06/2016 Elsevier Interactive Patient Education  2019 [employer].

## 2018-05-29 NOTE — Progress Notes (Signed)
Reviewed, agree 

## 2018-06-16 DIAGNOSIS — W57XXXA Bitten or stung by nonvenomous insect and other nonvenomous arthropods, initial encounter: Secondary | ICD-10-CM | POA: Diagnosis not present

## 2018-06-16 DIAGNOSIS — Z7689 Persons encountering health services in other specified circumstances: Secondary | ICD-10-CM | POA: Diagnosis not present

## 2018-06-16 DIAGNOSIS — S1096XA Insect bite of unspecified part of neck, initial encounter: Secondary | ICD-10-CM | POA: Diagnosis not present

## 2018-06-16 DIAGNOSIS — Z87891 Personal history of nicotine dependence: Secondary | ICD-10-CM | POA: Diagnosis not present

## 2018-08-27 ENCOUNTER — Telehealth: Payer: Self-pay | Admitting: Pulmonary Disease

## 2018-08-27 DIAGNOSIS — J449 Chronic obstructive pulmonary disease, unspecified: Secondary | ICD-10-CM

## 2018-08-27 MED ORDER — TRELEGY ELLIPTA 100-62.5-25 MCG/INH IN AEPB: 1.0000 | INHALATION_SPRAY | Freq: Every day | RESPIRATORY_TRACT | 5 refills | Status: DC

## 2018-08-27 NOTE — Telephone Encounter (Signed)
Called and spoke with pt. Pt stated he has been trying to get a refill of the Trelegy and was told by the pharmacy that he was needing to call us to provide new Rx info as the Rx was expired. I stated to pt that I would send refill of Trelegy to pharmacy for him and pt verbalized understanding. Verified pt's preferred pharmacy and sent Rx in for pt. Nothing further needed.

## 2018-08-27 NOTE — Telephone Encounter (Signed)
Spoke with pt. He is needing a new POC, his current one is not working properly. Order has been placed. Nothing further was needed at this time.

## 2018-09-03 ENCOUNTER — Telehealth: Payer: Self-pay | Admitting: Pulmonary Disease

## 2018-09-03 NOTE — Telephone Encounter (Signed)
Spoke with Kern Reap with [facility]. States the pt needs an OV and a requalifying walk. Called pt and he has been scheduled with Aaron Edelman on 09/05/2018 at 0930. Nothing further was needed.

## 2018-09-05 ENCOUNTER — Other Ambulatory Visit: Payer: Self-pay

## 2018-09-05 ENCOUNTER — Other Ambulatory Visit: Payer: Self-pay | Admitting: General Surgery

## 2018-09-05 ENCOUNTER — Encounter: Payer: Self-pay | Admitting: Pulmonary Disease

## 2018-09-05 ENCOUNTER — Ambulatory Visit (INDEPENDENT_AMBULATORY_CARE_PROVIDER_SITE_OTHER): Payer: Medicare Other | Admitting: Pulmonary Disease

## 2018-09-05 VITALS — BP 140/82 | HR 86 | Temp 98.4°F | Ht 68.0 in | Wt 213.0 lb

## 2018-09-05 DIAGNOSIS — J9611 Chronic respiratory failure with hypoxia: Secondary | ICD-10-CM

## 2018-09-05 DIAGNOSIS — J449 Chronic obstructive pulmonary disease, unspecified: Secondary | ICD-10-CM

## 2018-09-05 MED ORDER — TRELEGY ELLIPTA 100-62.5-25 MCG/INH IN AEPB: 1.0000 | INHALATION_SPRAY | Freq: Every day | RESPIRATORY_TRACT | 0 refills | Status: DC

## 2018-09-05 NOTE — Assessment & Plan Note (Signed)
Plan: Continue oxygen therapy as prescribed Monitor oxygen saturations to ensure the greater than 88% Contact our office if oxygen saturations are below 88% with oxygen on Continue to be maintained on 2 L O2

## 2018-09-05 NOTE — Progress Notes (Signed)
@Patient  ID: Gilbert Maes., male    DOB: 1950-05-12, 68 y.o.   MRN: [id number]  Chief Complaint  Patient presents with  . Follow-up    Was told by [facility] he needed to come in to continue O2 at home.    Referring provider: Maylon Peppers, MD  HPI:  68 year old male former smoker followed in our office for COPD GOLD IV and Chronic Resp Failure   PMH: TB in April 2018; completed treatment 11/16/2016 Smoker/ Smoking History:  Former Smoker. Quit 2013. 67.5 pack years.  Maintenance:  Trelegy Ellipta  Pt of: Dr. Lake Bells  09/05/2018  - Visit   68 year old male former smoker (quit 2013 67.5 pack years) followed in our office for COPD.  Patient is maintained on Trelegy Ellipta.  He feels that this is helping him with his breathing.  He was required to come in for an office visit today due to his [place] requiring him to requalify for oxygen with exertion.  Patient is maintained on nocturnal oxygen due to nocturnal hypoxia 3 L.  Patient has started to feel that sometimes the Trelegy Ellipta is effects may be wearing off towards the end of the day.  He also attributes this to though is increasing physical activity since April/2020 office visit.  Patient reports he has been trying to remain physically active and continues to increase his daily physical activity.  Sometimes he is a little bit more short of breath by the end of the day because of this.  He can take a rescue inhaler and this goes away.  He is using his rescue inhaler 2 times daily.  MMRC - Breathlessness Score 3 - I stop for breath after walking about 100 yards or after a few minutes on level ground (isle at grocery store is 139ft)    Tests:   Sleep study: 02/28/2017 AHI 0.0, O2 saturation nadir 88% on 2L [place]  Overnight oximetry: 01/18/2012 ONO RA normal  PFT: Arlyce Harman 10/08/12: FeV1 29% FeV1/FVC 41% Spiro 04/10/2013>> FeV1 26% FVC 56% FeV1/FVC 53%  PFT 01/2017: Ration 41% FEV1 0.88L (28% pred), FVC 2.15 L (52% pred)<  DLCO 11.35 (40% pred)  Labs: Alpha one antitrypsin 04/10/2013>>normal 145  Micro: 01/2017 Sputum AF[employer] culture negative  SIX MIN WALK 09/05/2018 09/05/2017 05/24/2016 09/14/2015 04/10/2013 10/08/2012 01/04/2012  Supplimental Oxygen during Test? (L/min) Yes Yes Yes Yes No No No  O2 Flow Rate 2 2 2 2  - - -  Type Pulse Continuous Pulse Pulse - - -  Tech Comments: Patient O2 dropped before the first lap could be completed, put back on 2L pulse. Patient stated he was not able to continue to walk an additional lap. patient stopped after one lap due to being tired. patients o2 did not drop Pt walked at moderate pace with steady gait.  Pt's lowest ambulatory sat was 95 on 2L of oxygen.  o2 sat 87% RA after 2nd lap. Pt c/o SOB.   Walk stopped after 2nd lap per pt's request.  C/o SOB and right leg pain.  Lowest o2 sat during walk 88% RA. Pt placed on 2lpm o2 with sat 95% and pulse 61 after 2nd lap.     FENO:  No results found for: NITRICOXIDE  PFT: PFT Results Latest Ref Rng & Units 02/25/2017  FVC-Pre L 2.14  FVC-Predicted Pre % 52  FVC-Post L 2.15  FVC-Predicted Post % 52  Pre FEV1/FVC % % 41  Post FEV1/FCV % % 41  FEV1-Pre L 0.88  FEV1-Predicted  Pre % 28  FEV1-Post L 0.88  DLCO UNC% % 40  DLCO COR %Predicted % 54    Imaging: No results found.    Specialty Problems      Pulmonary Problems   COPD (chronic obstructive pulmonary disease) Gold D    Copd gold D  01/18/2012 ONO RA normal Spiro 10/08/12: FeV1 29% FeV1/FVC 41% Alpha one antitrypsin 04/10/2013>>normal 145 Spiro 04/10/2013>> FeV1 26% FVC 56% FeV1/FVC 53%        Chronic respiratory failure with hypoxia (HCC)    Exertional hypoxemia 04/10/2013         Allergies  Allergen Reactions  . Azithromycin     Wife noted memory loss when patient was on this chronically    Immunization History  Administered Date(s) Administered  . Influenza Split 01/10/2012  . Influenza, High Dose Seasonal PF 10/23/2016, 02/11/2018  .  Influenza,inj,Quad PF,6+ Mos 10/08/2012, 10/23/2013, 10/19/2014, 11/28/2015  . Influenza-Unspecified 11/11/2015  . Pneumococcal Conjugate-13 12/21/2013  . Pneumococcal Polysaccharide-23 01/09/2012  . Zoster 05/27/2015   Pneumovax23 and flu in fall per pt   Past Medical History:  Diagnosis Date  . COPD (chronic obstructive pulmonary disease) ([place])   . Hypertension   . Osteoarthrosis   . Varicose veins     Tobacco History: Social History   Tobacco Use  Smoking Status Former Smoker  . Packs/day: 1.50  . Years: 45.00  . Pack years: 67.50  . Types: Cigarettes  . Quit date: 12/31/2011  . Years since quitting: 6.6  Smokeless Tobacco Never Used   Counseling given: Yes  Continue to not smoke  Outpatient Encounter Medications as of 09/05/2018  Medication Sig  . albuterol (PROVENTIL) (2.5 MG/3ML) 0.083% nebulizer solution Take 3 mLs (2.5 mg total) by nebulization every 6 (six) hours as needed for wheezing or shortness of breath.  Marland Kitchen albuterol (VENTOLIN HFA) 108 (90 Base) MCG/ACT inhaler Inhale 1 puff into the lungs every 6 (six) hours as needed for wheezing or shortness of breath.  . busPIRone (BUSPAR) 15 MG tablet Take 0.5 tablets (7.5 mg total) by mouth 2 (two) times daily.  . Fluticasone-Umeclidin-Vilant (TRELEGY ELLIPTA) 100-62.5-25 MCG/INH AEPB Inhale 1 puff into the lungs daily.  . OXYGEN Oxygen 2.5 lpm with sleep and as needed during the day, 3lpm with exertion  . Respiratory Therapy Supplies (FLUTTER) DEVI 1 Device by Does not apply route as needed.  . Fluticasone-Umeclidin-Vilant (TRELEGY ELLIPTA) 100-62.5-25 MCG/INH AEPB Inhale 1 puff into the lungs daily.  . [DISCONTINUED] albuterol (PROAIR HFA) 108 (90 Base) MCG/ACT inhaler Inhale 1-2 puffs into the lungs every 6 (six) hours as needed for shortness of breath. (Patient not taking: Reported on 05/27/2018)  . [DISCONTINUED] Fluticasone-Umeclidin-Vilant (TRELEGY ELLIPTA) 100-62.5-25 MCG/INH AEPB Inhale 1 puff into the lungs daily.  (Patient not taking: Reported on 09/05/2018)   No facility-administered encounter medications on file as of 09/05/2018.      Review of Systems  Review of Systems  Constitutional: Positive for fatigue. Negative for activity change, chills, fever and unexpected weight change.  HENT: Negative for postnasal drip, rhinorrhea, sinus pressure, sinus pain and sore throat.   Eyes: Negative.   Respiratory: Positive for shortness of breath (towards end of the day). Negative for cough and wheezing.   Cardiovascular: Negative for chest pain and palpitations.  Gastrointestinal: Negative for constipation, diarrhea, nausea and vomiting.  Endocrine: Negative.   Genitourinary: Negative.   Musculoskeletal: Negative.   Skin: Negative.   Neurological: Negative for dizziness and headaches.  Psychiatric/Behavioral: Negative.  Negative for dysphoric  mood. The patient is not nervous/anxious.   All other systems reviewed and are negative.    Physical Exam  BP 140/82 (BP Location: Left Arm, Patient Position: Sitting, Cuff Size: Normal)   Pulse 86   Temp 98.4 F (36.9 C)   Ht 5\' 8"  (1.727 m)   Wt 213 lb (96.6 kg)   SpO2 97% Comment: on 3L pulse  BMI 32.39 kg/m   Wt Readings from Last 5 Encounters:  09/05/18 213 lb (96.6 kg)  05/27/18 214 lb 6.4 oz (97.3 kg)  02/11/18 216 lb (98 kg)  09/05/17 204 lb (92.5 kg)  04/09/17 214 lb (97.1 kg)     Physical Exam Vitals signs and nursing note reviewed.  Constitutional:      General: He is not in acute distress.    Appearance: Normal appearance. He is obese.  HENT:     Head: Normocephalic and atraumatic.     Right Ear: Hearing, tympanic membrane, ear canal and external ear normal.     Left Ear: Hearing, tympanic membrane, ear canal and external ear normal.     Nose: Nose normal. No mucosal edema or rhinorrhea.     Right Turbinates: Not enlarged.     Left Turbinates: Not enlarged.     Mouth/Throat:     Mouth: Mucous membranes are dry.     Pharynx:  Oropharynx is clear. No oropharyngeal exudate.  Eyes:     Pupils: Pupils are equal, round, and reactive to light.  Neck:     Musculoskeletal: Normal range of motion.  Cardiovascular:     Rate and Rhythm: Normal rate and regular rhythm.     Pulses: Normal pulses.     Heart sounds: Normal heart sounds. No murmur.  Pulmonary:     Effort: Pulmonary effort is normal.     Breath sounds: Decreased breath sounds (through out exam) present. No wheezing or rales.  Abdominal:     General: Bowel sounds are normal. There is no distension.     Palpations: Abdomen is soft.     Tenderness: There is no abdominal tenderness.  Musculoskeletal:     Right lower leg: No edema.     Left lower leg: No edema.  Lymphadenopathy:     Cervical: No cervical adenopathy.  Skin:    General: Skin is warm and dry.     Capillary Refill: Capillary refill takes less than 2 seconds.     Findings: No erythema or rash.  Neurological:     General: No focal deficit present.     Mental Status: He is alert and oriented to person, place, and time.     Motor: No weakness.     Coordination: Coordination normal.     Gait: Gait is intact. Gait normal.  Psychiatric:        Mood and Affect: Mood normal.        Behavior: Behavior normal. Behavior is cooperative.        Thought Content: Thought content normal.        Judgment: Judgment normal.      Lab Results:  CBC    Component Value Date/Time   WBC 6.7 10/23/2016 0954   RBC 5.27 10/23/2016 0954   HGB 15.7 10/23/2016 0954   HCT 46.8 10/23/2016 0954   PLT 268.0 10/23/2016 0954   MCV 88.8 10/23/2016 0954   MCH 31.6 04/28/2010 1621   MCHC 33.5 10/23/2016 0954   RDW 14.2 10/23/2016 0954   LYMPHSABS 1.1 10/23/2016 0954   MONOABS 0.6  10/23/2016 0954   EOSABS 0.1 10/23/2016 0954   BASOSABS 0.0 10/23/2016 0954    BMET    Component Value Date/Time   NA 140 04/28/2010 1644   K 5.2 (H) 04/28/2010 1644   CL 105 04/28/2010 1644   GLUCOSE 80 04/28/2010 1644   BUN 17  04/28/2010 1644   CREATININE 1.0 04/28/2010 1644    BNP No results found for: BNP  ProBNP No results found for: PROBNP    Assessment & Plan:   COPD (chronic obstructive pulmonary disease) Gold D Plan: Continue Trelegy Ellipta Could consider in the future AstraZeneca triple therapy inhaler when available in September/2020 Continue oxygen therapy as prescribed Walk today in office for proves that patient required 2 L pulsed O2 Continue to use rescue inhaler as needed for shortness of breath and wheezing every 6 hours Establish as a new patient with Dr. Melvyn Novas in 2 months  Chronic respiratory failure with hypoxia ([place]) Plan: Continue oxygen therapy as prescribed Monitor oxygen saturations to ensure the greater than 88% Contact our office if oxygen saturations are below 88% with oxygen on Continue to be maintained on 2 L O2    Return in about 2 months (around 11/05/2018), or if symptoms worsen or fail to improve, for Follow up with Dr. Melvyn Novas.   Lauraine Rinne, NP 09/05/2018   This appointment was 28 minutes long with over 50% of the time in direct face-to-face patient care, assessment, plan of care, and follow-up.

## 2018-09-05 NOTE — Patient Instructions (Addendum)
Walk today to qualify to oxygen   Continue Trelegy Ellipta  >>> 1 puff daily in the morning >>>rinse mouth out after use  >>> This inhaler contains 3 medications that help manage her respiratory status, contact our office if you cannot afford this medication or unable to remain on this medication  Only use your albuterol as a rescue medication to be used if you can't catch your breath by resting or doing a relaxed purse lip breathing pattern.  - The less you use it, the better it will work when you need it. - Ok to use up to 2 puffs every 4 hours if you must but call for immediate appointment if use goes up over your usual need - Don't leave home without it !! (think of it like the spare tire for your car)   Continue to work on physical activity and exercise I encourage you to start walking in your house 10 minutes twice a day and gradually increase 5 minutes each week Do arm and leg exercises while watching TV and when you are sitting  Resume daily physical activity at the [facility] when COVID-19 restrictions are lifted and you feel safe and comfortable to do so  Continue to practice good hand hygiene Continue to wear a mask when outside  Continue oxygen therapy as prescribed  >>>maintain oxygen saturations greater than 88 percent  >>>if unable to maintain oxygen saturations please contact the office  >>>do not smoke with oxygen  >>>can use nasal saline gel or nasal saline rinses to moisturize nose if oxygen causes dryness   Return in about 2 months (around 11/05/2018), or if symptoms worsen or fail to improve, for Follow up with Dr. Melvyn Novas.   Coronavirus (COVID-19) Are you at risk?  Are you at risk for the Coronavirus (COVID-19)?  To be considered HIGH RISK for Coronavirus (COVID-19), you have to meet the following criteria:  . Traveled to [place], [place], [place], [place] or [place]; or in the [place] to [place], [place], [place], or [place]; and have fever, cough,  and shortness of breath within the last 2 weeks of travel OR . Been in close contact with a person diagnosed with COVID-19 within the last 2 weeks and have fever, cough, and shortness of breath . IF YOU DO NOT MEET THESE CRITERIA, YOU ARE CONSIDERED LOW RISK FOR COVID-19.  What to do if you are HIGH RISK for COVID-19?  Marland Kitchen If you are having a medical emergency, call 911. . Seek medical care right away. Before you go to a doctor's office, urgent care or emergency department, call ahead and tell them about your recent travel, contact with someone diagnosed with COVID-19, and your symptoms. You should receive instructions from your physician's office regarding next steps of care.  . When you arrive at healthcare provider, tell the healthcare staff immediately you have returned from visiting [place], [place], [place], [place] or [place]; or traveled in the [place] to [place], [place], [place], or [place]; in the last two weeks or you have been in close contact with a person diagnosed with COVID-19 in the last 2 weeks.   . Tell the health care staff about your symptoms: fever, cough and shortness of breath. . After you have been seen by a medical provider, you will be either: o Tested for (COVID-19) and discharged home on quarantine except to seek medical care if symptoms worsen, and asked to  - Stay home and avoid contact with others  until you get your results (4-5 days)  - Avoid travel on public transportation if possible (such as bus, train, or airplane) or o Sent to the Emergency Department by EMS for evaluation, COVID-19 testing, and possible admission depending on your condition and test results.  What to do if you are LOW RISK for COVID-19?  Reduce your risk of any infection by using the same precautions used for avoiding the common cold or flu:  Marland Kitchen Wash your hands often with soap and warm water for at least 20 seconds.  If soap and water are not readily available, use an alcohol-based  hand sanitizer with at least 60% alcohol.  . If coughing or sneezing, cover your mouth and nose by coughing or sneezing into the elbow areas of your shirt or coat, into a tissue or into your sleeve (not your hands). . Avoid shaking hands with others and consider head nods or verbal greetings only. . Avoid touching your eyes, nose, or mouth with unwashed hands.  . Avoid close contact with people who are sick. . Avoid places or events with large numbers of people in one location, like concerts or sporting events. . Carefully consider travel plans you have or are making. . If you are planning any travel outside or inside the [place], visit the CDC's Travelers' Health webpage for the latest health notices. . If you have some symptoms but not all symptoms, continue to monitor at home and seek medical attention if your symptoms worsen. . If you are having a medical emergency, call 911.   [place] / e-Visit: [email or web address]         [facility]: [facility]: [phone]                   [facility]: [phone]           It is flu season:   >>> Best ways to protect herself from the flu: Receive the yearly flu vaccine, practice good hand hygiene washing with soap and also using hand sanitizer when available, eat a nutritious meals, get adequate rest, hydrate appropriately   Please contact the office if your symptoms worsen or you have concerns that you are not improving.   Thank you for choosing [facility] for your healthcare, and for allowing [place] to partner with you on your healthcare journey. I am thankful to be able to provide care to you today.   Wyn Quaker FNP-C     COPD and Physical Activity Chronic obstructive pulmonary disease (COPD) is a long-term (chronic) condition that affects the lungs. COPD is a general  term that can be used to describe many different lung problems that cause lung swelling (inflammation) and limit airflow, including chronic bronchitis and emphysema. The main symptom of COPD is shortness of breath, which makes it harder to do even simple tasks. This can also make it harder to exercise and be active. Talk with your health care provider about treatments to help you breathe better and actions you can take to prevent breathing problems during physical activity. What are the benefits of exercising with COPD? Exercising regularly is an important part of a healthy lifestyle. You can still exercise and do physical activities even though you have COPD. Exercise and physical activity improve your shortness of breath by increasing blood flow (circulation). This causes your heart to pump more oxygen through your body. Moderate exercise can improve your:  Oxygen use.  Energy level.  Shortness of breath.  Strength in your breathing muscles.  Heart health.  Sleep.  Self-esteem and feelings of self-worth.  Depression, stress, and anxiety levels. Exercise can benefit everyone with COPD. The severity of your disease may affect how hard you can exercise, especially at first, but everyone can benefit. Talk with your health care provider about how much exercise is safe for you, and which activities and exercises are safe for you. What actions can I take to prevent breathing problems during physical activity?  Sign up for a pulmonary rehabilitation program. This type of program may include: ? Education about lung diseases. ? Exercise classes that teach you how to exercise and be more active while improving your breathing. This usually involves:  Exercise using your lower extremities, such as a stationary bicycle.  About 30 minutes of exercise, 2 to 5 times per week, for 6 to 12 weeks  Strength training, such as push ups or leg lifts. ? Nutrition education. ? Group classes in which you can  talk with others who also have COPD and learn ways to manage stress.  If you use an oxygen tank, you should use it while you exercise. Work with your health care provider to adjust your oxygen for your physical activity. Your resting flow rate is different from your flow rate during physical activity.  While you are exercising: ? Take slow breaths. ? Pace yourself and do not try to go too fast. ? Purse your lips while breathing out. Pursing your lips is similar to a kissing or whistling position. ? If doing exercise that uses a quick burst of effort, such as weight lifting:  Breathe in before starting the exercise.  Breathe out during the hardest part of the exercise (such as raising the weights). Where to find support You can find support for exercising with COPD from:  Your health care provider.  A pulmonary rehabilitation program.  Your local health department or community health programs.  Support groups, online or in-person. Your health care provider may be able to recommend support groups. Where to find more information You can find more information about exercising with COPD from:  American Lung Association: ClassInsider.se.  COPD Foundation: https://www.rivera.net/. Contact a health care provider if:  Your symptoms get worse.  You have chest pain.  You have nausea.  You have a fever.  You have trouble talking or catching your breath.  You want to start a new exercise program or a new activity. Summary  COPD is a general term that can be used to describe many different lung problems that cause lung swelling (inflammation) and limit airflow. This includes chronic bronchitis and emphysema.  Exercise and physical activity improve your shortness of breath by increasing blood flow (circulation). This causes your heart to provide more oxygen to your body.  Contact your health care provider before starting any exercise program or new activity. Ask your health care provider what  exercises and activities are safe for you. This information is not intended to replace advice given to you by your health care provider. Make sure you discuss any questions you have with your health care provider. Document Released: 02/07/2017 Document Revised: 05/07/2018 Document Reviewed: 02/07/2017 Elsevier Patient Education  2020 [employer].

## 2018-09-05 NOTE — Assessment & Plan Note (Addendum)
Plan: Continue Trelegy Ellipta Could consider in the future AstraZeneca triple therapy inhaler when available in September/2020 Continue oxygen therapy as prescribed Walk today in office for proves that patient required 2 L pulsed O2 Continue to use rescue inhaler as needed for shortness of breath and wheezing every 6 hours Establish as a new patient with Dr. Melvyn Novas in 2 months Patient would like to receive the Pneumovax 23 vaccine as well as a flu vaccine at the same time when he is due for the flu vaccine in the fall

## 2018-09-13 NOTE — Progress Notes (Signed)
Reviewed, agree 

## 2018-09-14 ENCOUNTER — Telehealth: Payer: Self-pay | Admitting: Pulmonary Disease

## 2018-09-14 NOTE — Telephone Encounter (Signed)
09/14/2018 2058  Triage,   Pt has been approved to establish with Dr Melvyn Novas as he is a former BQ pt. Please make sure his upcoming appt with Dr.Wert is a 9min OV/Consult slot. Also please make sure the pt has all medications with him at that appt for Dr Melvyn Novas to review.   Wyn Quaker FNP

## 2018-09-15 NOTE — Telephone Encounter (Signed)
Pt has been scheduled a 30-min visit with Dr. Melvyn Novas 10/6. Nothing further needed.

## 2018-10-01 ENCOUNTER — Other Ambulatory Visit: Payer: Self-pay | Admitting: Pulmonary Disease

## 2018-10-20 DIAGNOSIS — F4323 Adjustment disorder with mixed anxiety and depressed mood: Secondary | ICD-10-CM | POA: Diagnosis not present

## 2018-10-24 DIAGNOSIS — R972 Elevated prostate specific antigen [PSA]: Secondary | ICD-10-CM | POA: Diagnosis not present

## 2018-10-31 DIAGNOSIS — N529 Male erectile dysfunction, unspecified: Secondary | ICD-10-CM | POA: Diagnosis not present

## 2018-11-04 ENCOUNTER — Ambulatory Visit: Payer: Medicare Other | Admitting: Internal Medicine

## 2018-11-12 ENCOUNTER — Ambulatory Visit (INDEPENDENT_AMBULATORY_CARE_PROVIDER_SITE_OTHER): Payer: Medicare Other | Admitting: Internal Medicine

## 2018-11-12 ENCOUNTER — Encounter: Payer: Self-pay | Admitting: Internal Medicine

## 2018-11-12 ENCOUNTER — Other Ambulatory Visit: Payer: Self-pay

## 2018-11-12 ENCOUNTER — Ambulatory Visit (INDEPENDENT_AMBULATORY_CARE_PROVIDER_SITE_OTHER): Payer: Medicare Other

## 2018-11-12 DIAGNOSIS — Z23 Encounter for immunization: Secondary | ICD-10-CM | POA: Diagnosis not present

## 2018-11-12 DIAGNOSIS — J449 Chronic obstructive pulmonary disease, unspecified: Secondary | ICD-10-CM | POA: Diagnosis not present

## 2018-11-12 DIAGNOSIS — J42 Unspecified chronic bronchitis: Secondary | ICD-10-CM

## 2018-11-12 DIAGNOSIS — J9611 Chronic respiratory failure with hypoxia: Secondary | ICD-10-CM | POA: Diagnosis not present

## 2018-11-12 MED ORDER — BREZTRI AEROSPHERE 160-9-4.8 MCG/ACT IN AERO: 2.0000 | INHALATION_SPRAY | Freq: Two times a day (BID) | RESPIRATORY_TRACT | 0 refills | Status: DC

## 2018-11-12 MED ORDER — PREDNISONE 10 MG PO TABS: ORAL_TABLET | ORAL | 0 refills | Status: DC

## 2018-11-12 MED ORDER — BREZTRI AEROSPHERE 160-9-4.8 MCG/ACT IN AERO: 2.0000 | INHALATION_SPRAY | Freq: Two times a day (BID) | RESPIRATORY_TRACT | 11 refills | Status: DC

## 2018-11-12 MED ORDER — FLUTTER DEVI: 0 refills | Status: AC

## 2018-11-12 MED ORDER — DOXYCYCLINE HYCLATE 100 MG PO TABS: 100.0000 mg | ORAL_TABLET | Freq: Two times a day (BID) | ORAL | 0 refills | Status: DC

## 2018-11-12 NOTE — Progress Notes (Signed)
Spoke with pt and notified of results per Dr. Wert. Pt verbalized understanding and denied any questions. 

## 2018-11-12 NOTE — Assessment & Plan Note (Addendum)
Quit smoking  2013 at wt = 170  Gilbert Jefferson 10/08/12: FeV1 29% FeV1/FVC 41% Alpha one antitrypsin 04/10/2013   MM  Level 145 Spiro 04/10/2013>> FeV1 26% FVC 56% FeV1/FVC 53% - 11/12/2018  After extensive coaching inhaler device,  effectiveness =    90% hfa so try Breztri 2 bid   DDX of  difficult airways management almost all start with A and  include Adherence, Ace Inhibitors, Acid Reflux, Active Sinus Disease, Alpha 1 Antitripsin deficiency, Anxiety masquerading as Airways dz,  ABPA,  Allergy(esp in young), Aspiration (esp in elderly), Adverse effects of meds,  Active smoking or vaping, A bunch of PE's (a small clot burden can't cause this syndrome unless there is already severe underlying pulm or vascular dz with poor reserve) plus two Bs  = Bronchiectasis and Beta blocker use..and one C= CHF   Adherence is always the initial "prime suspect" and is a multilayered concern that requires a "trust but verify" approach in every patient - starting with knowing how to use medications, especially inhalers, correctly, keeping up with refills and understanding the fundamental difference between maintenance and prns vs those medications only taken for a very short course and then stopped and not refilled.  - see hfa teaching   ? Allergy/asthmatic component > Prednisone 10 mg take  4 each am x 2 days,   2 each am x 2 days,  1 each am x 2 days and stop   ? Active sinus dz/ rhintis / bronchitis > doxy  ? Adverse effects of dpi > try off trelegy for now   ? Alpha one at def > already ruled out   ? Anxiety/depression/ deconditioning  > usually at the bottom of this list of usual suspects but should be much included on this pt's based on H and P and note already on psychotropics and may interfere with adherence and also interpretation of response or lack thereof to symptom management which can be quite subjective.   ? Bronchiectasis > feels flutter valve has helped so ok to add back for prn use but no other w/u or rx  needed empirically fo now.   ? chf > nothing to suggest   >>> f/u in 4 weeks, call sooner if needed

## 2018-11-12 NOTE — Progress Notes (Signed)
Gilbert Maes., male    DOB: [birth year],   MRN: [id number]   Brief patient profile:  15 yowm MM quit smoking 2013   With GOLD IV/Group D on 02 since around 2015   Baseline wt  170 @ quit smoking   History of Present Illness  11/12/2018  Pulmonary/ 1st office eval/Madysin Crisp  Chief Complaint  Patient presents with  . Follow-up    Breathing has been worse over the past 3 wks. He is using his albuterol inhaler 2 x per day and has not used neb.   Dyspnea:  Used to do treadmill at [facility]  slow speed/flat x hour daily while on 3lpm last done  in March 2020  Worse x 3 weeks doe, gradual  Cough: slt green and thick this am only, o/w very little mucus, some upper airway cough though  Sleep: 2 pillows, on side, bed is flat SABA use: twice daily plus / trelegy  02  Sleeps on 2.5 lpm , not using at rest or around the house, adjusts with activity out of house   No obvious day to day or daytime variability or assoc excess/ purulent sputum or mucus plugs or hemoptysis or cp or chest tightness, subjective wheeze or overt sinus or hb symptoms.   Sleeping as above without nocturnal    exacerbation  of respiratory  c/o's or need for noct saba. Also denies any obvious fluctuation of symptoms with weather or environmental changes or other aggravating or alleviating factors except as outlined above   No unusual exposure hx or h/o childhood pna/ asthma or knowledge of premature birth.  Current Allergies, Complete Past Medical History, Past Surgical History, Family History, and Social History were reviewed in [employer] record.  ROS  The following are not active complaints unless bolded Hoarseness, sore throat, dysphagia, dental problems, itching, sneezing,  nasal congestion or discharge of excess mucus or purulent secretions, ear ache,   fever, chills, sweats, unintended wt loss or wt gain, classically pleuritic or exertional cp,  orthopnea pnd or arm/hand swelling  or leg swelling,  presyncope, palpitations, abdominal pain, anorexia, nausea, vomiting, diarrhea  or change in bowel habits or change in bladder habits, change in stools or change in urine, dysuria, hematuria,  rash, arthralgias, visual complaints, headache, numbness, weakness or ataxia or problems with walking or coordination,  change in mood or  memory.           Past Medical History:  Diagnosis Date  . COPD (chronic obstructive pulmonary disease) ([place])   . Hypertension   . Osteoarthrosis   . Varicose veins     Outpatient Medications Prior to Visit  Medication Sig Dispense Refill  . albuterol (PROVENTIL) (2.5 MG/3ML) 0.083% nebulizer solution Take 3 mLs (2.5 mg total) by nebulization every 6 (six) hours as needed for wheezing or shortness of breath. 360 mL 11  . albuterol (VENTOLIN HFA) 108 (90 Base) MCG/ACT inhaler Inhale 1 puff into the lungs every 6 (six) hours as needed for wheezing or shortness of breath. 1 Inhaler 4  . busPIRone (BUSPAR) 15 MG tablet Take 0.5 tablets (7.5 mg total) by mouth 2 (two) times daily. 90 tablet 3  . Fluticasone-Umeclidin-Vilant (TRELEGY ELLIPTA) 100-62.5-25 MCG/INH AEPB Inhale 1 puff into the lungs daily. 60 each 5  . OXYGEN Oxygen 2.5 lpm with sleep and as needed during the day, 3lpm with exertion    . Respiratory Therapy Supplies (FLUTTER) DEVI 1 Device by Does not apply route as needed. 1 each  0      Objective:     BP 132/60 (BP Location: Left Arm, Cuff Size: Normal)   Pulse (!) 54   Temp (!) 97 F (36.1 C) (Temporal)   Ht 5\' 8"  (1.727 m)   Wt 206 lb 9.6 oz (93.7 kg)   SpO2 97% Comment: 3lpm pulsed o2  BMI 31.41 kg/m   SpO2: 97 %(3lpm pulsed o2) O2 Type: Pulse O2 O2 Flow Rate (L/min): 3 L/min      HEENT : pt wearing mask not removed for exam due to covid -19 concerns.    NECK :  without JVD/Nodes/TM/ nl carotid upstrokes bilaterally   LUNGS: no acc muscle use,  Mod barrel  contour chest wall with bilateral  Distant bs s audible wheeze and  without  cough on insp or exp maneuvers and mod  Hyperresonant  to  percussion bilaterally     CV:  RRR  no s3 or murmur or increase in P2, and no edema   ABD: Quite obese oft and nontender with pos mid insp Hoover's  in the supine position. No bruits or organomegaly appreciated, bowel sounds nl  MS:     ext warm without deformities, calf tenderness, cyanosis or clubbing No obvious joint restrictions   SKIN: warm and dry without lesions    NEURO:  alert, approp, nl sensorium with  no motor or cerebellar deficits apparent.   CXR PA and Lateral:   11/12/2018 :    I personally reviewed images and   impression as follows:    copd only          Assessment   No problem-specific Assessment & Plan notes found for this encounter.     Christinia Gully, MD 11/12/2018

## 2018-11-12 NOTE — Patient Instructions (Addendum)
Plan A = Automatic = Always=    Breztri in place of trelegy   Take 2 puffs first thing in am and then another 2 puffs about 12 hours later.   Work on inhaler technique:  relax and gently blow all the way out then take a nice smooth deep breath back in, triggering the inhaler at same time you start breathing in.  Hold for up to 5 seconds if you can. Blow out thru nose. Rinse and gargle with water when done       Plan B = Backup (to supplement plan A, not to replace it) Only use your albuterol inhaler as a rescue medication to be used if you can't catch your breath by resting or doing a relaxed purse lip breathing pattern.  - The less you use it, the better it will work when you need it. - Ok to use the inhaler up to 2 puffs  every 4 hours if you must but call for appointment if use goes up over your usual need - Don't leave home without it !!  (think of it like the spare tire for your car)   Plan C = Crisis (instead of Plan B but only if Plan B stops working) - only use your albuterol nebulizer if you first try Plan B and it fails to help > ok to use the nebulizer up to every 4 hours but if start needing it regularly call for immediate appointment   If you don't like the breztri go back to the trelegy    Prednisone 10 mg take  4 each am x 2 days,   2 each am x 2 days,  1 each am x 2 days and stop   Doxycycline 100 mg twice daily until done  Flutter valve as needed for cough/ congestion  Adjust your 02 flow to keep your sats above 90%   Please remember to go to the  x-ray department  for your tests - we will call you with the results when they are available     Please schedule a follow up office visit in 6 weeks, call sooner if needed  Add needs pneumovax on return.

## 2018-11-12 NOTE — Assessment & Plan Note (Addendum)
Wt 170 at smoking cessation     Body mass index is 31.41 kg/m.  -  Lab Results  Component Value Date   TSH 2.64 10/23/2016     Contributing to gerd risk/ doe/reviewed the need and the process to achieve and maintain neg calorie balance > defer f/u primary care including intermittently monitoring thyroid status.   Total time devoted to counseling  > 50 % of initial  40 min office visit with pt new to me:  review case with pt/ discussion of options/alternatives/ personally creating written customized instructions  in presence of pt  then going over those specific  Instructions directly with the pt including how to use all of the meds but in particular covering each new medication in detail and the difference between the maintenance= "automatic" meds and the prns using an action plan format for the latter (If this problem/symptom => do that organization reading Left to right).  Please see AVS from this visit for a full list of these instructions which I personally wrote for this pt and  are unique to this visit.

## 2018-11-12 NOTE — Assessment & Plan Note (Signed)
Exertional hypoxemia 04/10/2013   As of 11/12/2018  On 2.5 lpm hs and titrate daytime to keep sats > 90%   Adequate control on present rx, reviewed in detail with pt > no change in rx needed   - suspect he may have hypercarbic component now though no recent hc03 on record

## 2018-11-18 ENCOUNTER — Telehealth: Payer: Self-pay | Admitting: Internal Medicine

## 2018-11-18 MED ORDER — BREZTRI AEROSPHERE 160-9-4.8 MCG/ACT IN AERO: 2.0000 | INHALATION_SPRAY | Freq: Two times a day (BID) | RESPIRATORY_TRACT | 0 refills | Status: DC

## 2018-11-18 NOTE — Telephone Encounter (Signed)
Called and spoke with patient.  Patient stated his pharmacy will not have his [facility] prescription ready until next week.  Patient is going out of town and requested a sample till he receives his [place] prescription.  Patient stated he has had good results from [place]. Breztri prescription placed at front desk for pick up. Nothing further at this time.  Per MW 11/12/18- Instructions  Plan A = Automatic = Always=    Breztri in place of trelegy   Take 2 puffs first thing in am and then another 2 puffs about 12 hours later.   Work on inhaler technique:  relax and gently blow all the way out then take a nice smooth deep breath back in, triggering the inhaler at same time you start breathing in.  Hold for up to 5 seconds if you can. Blow out thru nose. Rinse and gargle with water when done

## 2018-11-24 ENCOUNTER — Telehealth: Payer: Self-pay | Admitting: Internal Medicine

## 2018-11-24 NOTE — Telephone Encounter (Signed)
LMTCB x1 for pt.  

## 2018-11-25 NOTE — Telephone Encounter (Signed)
Pt returned call and would like a call back.   °

## 2018-11-25 NOTE — Telephone Encounter (Signed)
Spoke with pt. States that Judithann Sauger is too expensive and he would like to go back on Trelegy.  MW - please advise. Thanks.

## 2018-11-25 NOTE — Telephone Encounter (Signed)
Called and spoke to patient. Patient stated that he did not notice a difference in cough with one inhaler verses the other. Patient stated he currently has supply of Trelegy to last through his next appointment so he didn't need any Rx sent in at this time. Patient will discuss at next visit.  Nothing further needed at this time.

## 2018-11-25 NOTE — Telephone Encounter (Signed)
Fine with me but be on the lookout for worse cough that can occur with the powder inhalers like trelegy -  that's why the trial of breztri was suggested ? Was his cough better one vs the other?    We'll re-eval this at f/u planned but can move up sooner ov if needed

## 2018-12-24 ENCOUNTER — Encounter: Payer: Self-pay | Admitting: Internal Medicine

## 2018-12-24 ENCOUNTER — Ambulatory Visit (INDEPENDENT_AMBULATORY_CARE_PROVIDER_SITE_OTHER): Payer: Medicare Other | Admitting: Internal Medicine

## 2018-12-24 ENCOUNTER — Other Ambulatory Visit: Payer: Self-pay

## 2018-12-24 DIAGNOSIS — J449 Chronic obstructive pulmonary disease, unspecified: Secondary | ICD-10-CM | POA: Diagnosis not present

## 2018-12-24 DIAGNOSIS — J9611 Chronic respiratory failure with hypoxia: Secondary | ICD-10-CM

## 2018-12-24 MED ORDER — TRELEGY ELLIPTA 100-62.5-25 MCG/INH IN AEPB: 1.0000 | INHALATION_SPRAY | Freq: Every day | RESPIRATORY_TRACT | Status: DC

## 2018-12-24 MED ORDER — PREDNISONE 10 MG PO TABS: ORAL_TABLET | ORAL | 0 refills | Status: DC

## 2018-12-24 MED ORDER — DOXYCYCLINE HYCLATE 100 MG PO TABS: 100.0000 mg | ORAL_TABLET | Freq: Two times a day (BID) | ORAL | 0 refills | Status: DC

## 2018-12-24 NOTE — Patient Instructions (Addendum)
Stay on trelegy daily   Prednisone 10 mg take  4 each am x 2 days,   2 each am x 2 days,  1 each am x 2 days and stop   Doxy 100 mg twice daily x 7 days    Please schedule a follow up office visit in 4 weeks, sooner if needed  with all medications /inhalers/ solutions in hand so we can verify exactly what you are taking. This includes all medications from all doctors and over the counters - also bring with you the drug formulary for 2021

## 2018-12-24 NOTE — Assessment & Plan Note (Signed)
Quit smoking  2013 at wt = 170  Spiro 10/08/12: FeV1 29% FeV1/FVC 41% Alpha one antitrypsin 04/10/2013   MM  Level 145 Spiro 04/10/2013>> FeV1 26% FVC 56% FeV1/FVC 53% - 11/12/2018  After extensive coaching inhaler device,  effectiveness =    90% hfa so try Breztri 2 bid > not on plan so resumed trelegy and flared first week in November 2020 > rx 12/24/2018 with doxy/pred x 6d   Mod exac with increased sputum vol, purulent rx as above   Clearly  Group D in terms of symptom/risk and laba/lama/ICS  therefore appropriate rx at this point >>>  trelegy ok for now but if continues to flare into new year return with formulary and consider rx with triple rx by neb  Advised:  formulary restrictions will be an ongoing challenge for the forseable future and I would be happy to pick an alternative if the pt will first  provide me a list of them -  pt  will need to return here for training for any new device that is required eg dpi vs hfa vs respimat.    In the meantime we can always provide samples so that the patient never runs out of any needed respiratory medications.   Each maintenance medication was reviewed in detail including most importantly the difference between maintenance and as needed and under what circumstances the prns are to be used.  Please see AVS for specific  Instructions which are unique to this visit and I personally typed out  which were reviewed in detail over the phone with the patient and a copy provided by mail

## 2018-12-24 NOTE — Progress Notes (Signed)
Gilbert Jefferson., male    DOB: 1950/06/09,   MRN: [id number]   Brief patient profile:  89 yowm MM quit smoking 2013   With GOLD IV/Group D on 02 since around 2015   Baseline wt  170 @ quit smoking   History of Present Illness  11/12/2018  Pulmonary/ 1st office eval/Shaliah Wann  Chief Complaint  Patient presents with  . Follow-up    Breathing has been worse over the past 3 wks. He is using his albuterol inhaler 2 x per day and has not used neb.   Dyspnea:  Used to do treadmill at [facility]  slow speed/flat x hour daily while on 3lpm last done  in March 2020  Worse x 3 weeks doe, gradual  Cough: slt green and thick this am only, o/w very little mucus, some upper airway cough though  Sleep: 2 pillows, on side, bed is flat SABA use: twice daily plus / trelegy  02  Sleeps on 2.5 lpm , not using at rest or around the house, adjusts with activity out of house  rec Plan A = Automatic = Always=    Breztri in place of trelegy   Take 2 puffs first thing in am and then another 2 puffs about 12 hours later.  Work on inhaler technique:  Plan B = Backup (to supplement plan A, not to replace it) Only use your albuterol inhaler as a rescue medication Plan C = Crisis (instead of Plan B but only if Plan B stops working) - only use your albuterol nebulizer if you first try Plan B and it fails to help > ok to use the nebulizer up to every 4 hours but if start needing it regularly call for immediate appointment If you don't like the breztri go back to the trelegy  Prednisone 10 mg take  4 each am x 2 days,   2 each am x 2 days,  1 each am x 2 days and stop  Doxycycline 100 mg twice daily until done Flutter valve as needed for cough/ congestion Adjust your 02 flow to keep your sats above 90%     Virtual Visit via Telephone Note 12/24/2018   I connected with Gilbert Jefferson. on 12/24/18 at 1045 AM EST by telephone and verified that I am speaking with the correct person using two identifiers.   I discussed the  limitations, risks, security and privacy concerns of performing an evaluation and management service by telephone and the availability of in person appointments. I also discussed with the patient that there may be a patient responsible charge related to this service. The patient expressed understanding and agreed to proceed.   History of Present Illness: Back on trelegy ? breztri better but could not afford on his insurance  Dyspnea:  Still doing some deer hunting x 150 ft s standing s 02  Cough: some worse x sev weeks green this am  Sleeping: 2 pillows, no resp symptoms,  SABA use: none needed   02: 2.5 hs -  Does use it at [employer] at 3lpm    No obvious day to day or daytime variability or assoc excess/ purulent sputum or mucus plugs or hemoptysis or cp or chest tightness, subjective wheeze or overt sinus or hb symptoms.    Also denies any obvious fluctuation of symptoms with weather or environmental changes or other aggravating or alleviating factors except as outlined above.   Meds reviewed/ med reconciliation completed       Observations/Objective: Sounds  fine on the phone   Assessment and Plan: See problem list for active a/p's   Follow Up Instructions: See avs for instructions unique to this ov which includes revised/ updated med list     I discussed the assessment and treatment plan with the patient. The patient was provided an opportunity to ask questions and all were answered. The patient agreed with the plan and demonstrated an understanding of the instructions.   The patient was advised to call back or seek an in-person evaluation if the symptoms worsen or if the condition fails to improve as anticipated.  I provided 25  minutes of non-face-to-face time during this encounter.   Christinia Gully, MD           Past Medical History:  Diagnosis Date  . COPD (chronic obstructive pulmonary disease) ([place])   . Hypertension   . Osteoarthrosis   . Varicose veins

## 2018-12-24 NOTE — Assessment & Plan Note (Addendum)
Exertional hypoxemia 04/10/2013   As of 12/24/2018  On 2.5 lpm hs and titrate daytime to keep sats > 90%   Advised:  Make sure you check your oxygen saturations at highest level of activity to be sure it stays over 90% and adjust upward to maintain this level if needed but remember to turn it back to previous settings when you stop (to conserve your supply).

## 2019-01-02 ENCOUNTER — Other Ambulatory Visit: Payer: Self-pay

## 2019-01-02 ENCOUNTER — Telehealth: Payer: Self-pay | Admitting: Internal Medicine

## 2019-01-02 ENCOUNTER — Ambulatory Visit (INDEPENDENT_AMBULATORY_CARE_PROVIDER_SITE_OTHER): Payer: Medicare Other | Admitting: Pulmonary Disease

## 2019-01-02 ENCOUNTER — Encounter: Payer: Self-pay | Admitting: Pulmonary Disease

## 2019-01-02 DIAGNOSIS — A159 Respiratory tuberculosis unspecified: Secondary | ICD-10-CM

## 2019-01-02 DIAGNOSIS — J449 Chronic obstructive pulmonary disease, unspecified: Secondary | ICD-10-CM

## 2019-01-02 DIAGNOSIS — J9611 Chronic respiratory failure with hypoxia: Secondary | ICD-10-CM | POA: Diagnosis not present

## 2019-01-02 MED ORDER — PREDNISONE 10 MG PO TABS: ORAL_TABLET | ORAL | 0 refills | Status: DC

## 2019-01-02 NOTE — Telephone Encounter (Signed)
Needs another televist, ok to give to NPs

## 2019-01-02 NOTE — Telephone Encounter (Signed)
Called and spoke with pt letting him know we needed to schedule televisit. Pt verbalized understanding. televisit scheduled with Gilbert Jefferson at 1:30.nothing further needed.

## 2019-01-02 NOTE — Telephone Encounter (Signed)
Called and spoke with pt who stated he had been on prednisone and doxycycline which he finished yesterday 12/3. Pt stated he is still having congestion of chest and is still coughing up mucus. Pt said the mucus is currently clear in color but due to still having symptoms pt is wanting to know if MW wanted to prescribe anything else.  Pt denies any complaints of fever. Pt said that he does have wheezing when he first wakes up in the morning. Pt did have a hard time sleeping last night 12/3.  Dr. Melvyn Novas, please advise. Thanks!

## 2019-01-02 NOTE — Telephone Encounter (Signed)
Patient is returning phone call. Patient phone number is [phone]. 

## 2019-01-02 NOTE — Assessment & Plan Note (Signed)
History of Mycobacterium tuberculosis infection 2018 No CT axial imaging ?  Bronchiectasis Patient reporting morning cough with clear sputum  Plan: Continue to monitor the patient clinically Patient declined sputum sample today Encourage patient to contact her office if he feels that he is coughing up more mucus or mucus becomes discolored so that we can test sputum Increase flutter valve use to 2 times daily 10 breaths each time

## 2019-01-02 NOTE — Assessment & Plan Note (Signed)
Plan: Prednisone taper today Encouraged flutter valve use 2 times daily, 10 breaths each time at least Continue Trelegy Ellipta Keep close follow-up with our office We will defer antibiotic therapy at this time as patient has clear sputum, decreased sputum production, recent antibiotic use, no current fevers

## 2019-01-02 NOTE — Progress Notes (Signed)
Virtual Visit via Telephone Note  I connected with Gilbert Jefferson. on 01/02/19 at  1:30 PM EST by telephone and verified that I am speaking with the correct person using two identifiers.  Location: Patient: Home Provider: [facility] - [id number], [place], [place], [place] [address]   I discussed the limitations, risks, security and privacy concerns of performing an evaluation and management service by telephone and the availability of in person appointments. I also discussed with the patient that there may be a patient responsible charge related to this service. The patient expressed understanding and agreed to proceed.  Patient consented to consult via telephone: Yes People present and their role in pt care: Pt     History of Present Illness:  68 year old male former smoker followed in our office for COPD GOLD IV and Chronic Resp Failure   PMH: TB in April 2018; completed treatment 11/16/2016 Smoker/ Smoking History:  Former Smoker. Quit 2013. 67.5 pack years.  Maintenance:  Trelegy Ellipta  Pt of: Dr. Melvyn Novas  Chief complaint: Continued congestion despite antibiotic use  68 year old male former smoker followed in our office by Dr. Melvyn Novas.  Patient has very severe COPD on 2019 pulmonary function test.  Patient also has a history of recurrent chronic bronchitis-like episodes.  He does have a flutter valve at home for this.  He was last treated in our office 2 weeks ago by doxycycline and prednisone by Dr. Melvyn Novas for suspected exacerbation.  Patient reports that he has gotten somewhat better but productive cough is still present with clear mucus.  At his baseline patient reports that he does not typically produce mucus.  He does have some occasional wheezing.  He continues to take Trelegy Ellipta as prescribed.  He does have a flutter valve he is only used it 2 times this week 30 breaths each time.  He also has used his [place] nebulized med 1 time daily.  Patient does not  believe that he is coughing up enough mucus to be able to get a sputum culture.  Previous history of tuberculosis in 2018.  No CT axial imaging on file.    Observations/Objective:  Sleep study: 02/28/2017 AHI 0.0, O2 saturation nadir 88% on 2L [place]  Overnight oximetry: 01/18/2012 ONO RA normal  PFT: Arlyce Harman 10/08/12: FeV1 29% FeV1/FVC 41% Spiro 04/10/2013>> FeV1 26% FVC 56% FeV1/FVC 53%  PFT 01/2017: R[place] 41% FEV1 0.88L (28% pred), FVC 2.15 L (52% pred)< DLCO 11.35 (40% pred)  Labs: Alpha one antitrypsin 04/10/2013>>normal 145  Micro: 01/2017 Sputum AFB culture negative  Social History   Tobacco Use  Smoking Status Former Smoker  . Packs/day: 1.50  . Years: 45.00  . Pack years: 67.50  . Types: Cigarettes  . Quit date: 12/31/2011  . Years since quitting: 7.0  Smokeless Tobacco Never Used   Immunization History  Administered Date(s) Administered  . Fluad Quad(high Dose 65+) 11/12/2018  . Influenza Split 01/10/2012  . Influenza, High Dose Seasonal PF 01/10/2012, 10/08/2012, 10/23/2013, 10/19/2014, 11/28/2015, 10/23/2016, 02/11/2018  . Influenza,inj,Quad PF,6+ Mos 10/08/2012, 10/23/2013, 10/19/2014, 11/28/2015  . Influenza-Unspecified 11/11/2015, 10/23/2016  . Pneumococcal Conjugate-13 01/09/2012, 12/21/2013  . Pneumococcal Polysaccharide-23 01/09/2012  . Zoster 05/27/2015    Assessment and Plan:  Chronic respiratory failure with hypoxia (HCC) Plan: Continue oxygen therapy as prescribed Keep follow-up in December/2020 with Dr. Melvyn Novas  COPD GOLD IV/ 02 dep  Plan: Prednisone taper today Encouraged flutter valve use 2 times daily, 10 breaths each time at least Continue Trelegy Ellipta  Keep close follow-up with our office We will defer antibiotic therapy at this time as patient has clear sputum, decreased sputum production, recent antibiotic use, no current fevers  Mycobacterium tuberculosis infection History of Mycobacterium tuberculosis infection 2018 No CT axial  imaging ?  Bronchiectasis Patient reporting morning cough with clear sputum  Plan: Continue to monitor the patient clinically Patient declined sputum sample today Encourage patient to contact her office if he feels that he is coughing up more mucus or mucus becomes discolored so that we can test sputum Increase flutter valve use to 2 times daily 10 breaths each time   Follow Up Instructions:  Return in about 4 weeks (around 01/30/2019), or if symptoms worsen or fail to improve, for Follow up with Dr. Melvyn Novas.   I discussed the assessment and treatment plan with the patient. The patient was provided an opportunity to ask questions and all were answered. The patient agreed with the plan and demonstrated an understanding of the instructions.   The patient was advised to call back or seek an in-person evaluation if the symptoms worsen or if the condition fails to improve as anticipated.  I provided 23 minutes of non-face-to-face time during this encounter.   Lauraine Rinne, NP

## 2019-01-02 NOTE — Assessment & Plan Note (Signed)
Plan: Continue oxygen therapy as prescribed Keep follow-up in December/2020 with Dr. Melvyn Novas

## 2019-01-02 NOTE — Patient Instructions (Signed)
You were seen today by Gilbert Rinne, NP  for:   1. COPD GOLD IV/ 02 dep   - predniSONE (DELTASONE) 10 MG tablet; 4 tabs for 2 days, then 3 tabs for 2 days, 2 tabs for 2 days, then 1 tab for 2 days, then stop  Dispense: 20 tablet; Refill: 0  Trelegy Ellipta  >>> 1 puff daily in the morning >>>rinse mouth out after use  >>> This inhaler contains 3 medications that help manage her respiratory status, contact our office if you cannot afford this medication or unable to remain on this medication  Only use your albuterol as a rescue medication to be used if you can't catch your breath by resting or doing a relaxed purse lip breathing pattern.  - The less you use it, the better it will work when you need it. - Ok to use up to 2 puffs  every 4 hours if you must but call for immediate appointment if use goes up over your usual need - Don't leave home without it !!  (think of it like the spare tire for your car)   Note your daily symptoms > remember "red flags" for COPD:   >>>Increase in cough >>>increase in sputum production >>>increase in shortness of breath or activity  intolerance.   If you notice these symptoms, please call the office to be seen.    Use a flutter valve 10 breaths twice a day or 4 to 5 breaths 4-5 times a day to help clear mucus out Let us know if you have cough with change in mucus color or fevers or chills.  At that point you would need an antibiotic. Maintain a healthy nutritious diet, eating whole foods Take your medications as prescribed    2. Chronic respiratory failure with hypoxia (HCC)  Continue oxygen therapy as prescribed  >>>maintain oxygen saturations greater than 88 percent  >>>if unable to maintain oxygen saturations please contact the office  >>>do not smoke with oxygen  >>>can use nasal saline gel or nasal saline rinses to moisturize nose if oxygen causes dryness  3. Mycobacterium tuberculosis infection    We recommend today:  No orders of the  defined types were placed in this encounter.  No orders of the defined types were placed in this encounter.  Meds ordered this encounter  Medications  . predniSONE (DELTASONE) 10 MG tablet    Sig: 4 tabs for 2 days, then 3 tabs for 2 days, 2 tabs for 2 days, then 1 tab for 2 days, then stop    Dispense:  20 tablet    Refill:  0    Follow Up:    No follow-ups on file.   Please do your part to reduce the spread of COVID-19:      Reduce your risk of any infection  and COVID19 by using the similar precautions used for avoiding the common cold or flu:  Marland Kitchen Wash your hands often with soap and warm water for at least 20 seconds.  If soap and water are not readily available, use an alcohol-based hand sanitizer with at least 60% alcohol.  . If coughing or sneezing, cover your mouth and nose by coughing or sneezing into the elbow areas of your shirt or coat, into a tissue or into your sleeve (not your hands). Langley Gauss A MASK when in public  . Avoid shaking hands with others and consider head nods or verbal greetings only. . Avoid touching your eyes, nose, or mouth with  unwashed hands.  . Avoid close contact with people who are sick. . Avoid places or events with large numbers of people in one location, like concerts or sporting events. . If you have some symptoms but not all symptoms, continue to monitor at home and seek medical attention if your symptoms worsen. . If you are having a medical emergency, call 911.   [place] / e-Visit: [email or web address]         [facility]: [facility]: [id number]                   [facility]: [id number]     It is flu season:   >>> Best ways to protect herself from the flu: Receive the yearly flu vaccine, practice good hand hygiene washing with soap and also using hand sanitizer when  available, eat a nutritious meals, get adequate rest, hydrate appropriately   Please contact the office if your symptoms worsen or you have concerns that you are not improving.   Thank you for choosing [facility] for your healthcare, and for allowing [place] to partner with you on your healthcare journey. I am thankful to be able to provide care to you today.   Gilbert Quaker FNP-C

## 2019-01-02 NOTE — Telephone Encounter (Signed)
Called pt but unable to reach. Left message for pt to return call. 

## 2019-01-21 ENCOUNTER — Encounter: Payer: Self-pay | Admitting: Internal Medicine

## 2019-01-21 ENCOUNTER — Ambulatory Visit (INDEPENDENT_AMBULATORY_CARE_PROVIDER_SITE_OTHER): Payer: Medicare Other | Admitting: Internal Medicine

## 2019-01-21 ENCOUNTER — Other Ambulatory Visit: Payer: Self-pay

## 2019-01-21 DIAGNOSIS — J449 Chronic obstructive pulmonary disease, unspecified: Secondary | ICD-10-CM | POA: Diagnosis not present

## 2019-01-21 DIAGNOSIS — J9611 Chronic respiratory failure with hypoxia: Secondary | ICD-10-CM

## 2019-01-21 MED ORDER — BREZTRI AEROSPHERE 160-9-4.8 MCG/ACT IN AERO: 2.0000 | INHALATION_SPRAY | Freq: Two times a day (BID) | RESPIRATORY_TRACT | Status: DC

## 2019-01-21 MED ORDER — BREZTRI AEROSPHERE 160-9-4.8 MCG/ACT IN AERO: 2.0000 | INHALATION_SPRAY | Freq: Two times a day (BID) | RESPIRATORY_TRACT | 0 refills | Status: DC

## 2019-01-21 MED ORDER — FAMOTIDINE 20 MG PO TABS: ORAL_TABLET | ORAL | 11 refills | Status: DC

## 2019-01-21 MED ORDER — PANTOPRAZOLE SODIUM 40 MG PO TBEC: 40.0000 mg | DELAYED_RELEASE_TABLET | Freq: Every day | ORAL | 2 refills | Status: DC

## 2019-01-21 NOTE — Assessment & Plan Note (Signed)
Exertional hypoxemia 04/10/2013  - ONO RA 01/21/2019 >>>   As of 01/21/2019  On 3 lpm hs and titrate daytime to keep sats > 90%   Advised: Make sure you check your oxygen saturations at highest level of activity to be sure it stays over 90% and adjust upward to maintain this level if needed but remember to turn it back to previous settings when you stop (to conserve your supply).    Each maintenance medication was reviewed in detail including most importantly the difference between maintenance and as needed and under what circumstances the prns are to be used.  Please see AVS for specific  Instructions which are unique to this visit and I personally typed out  which were reviewed in detail over the phone with the patient and a copy provided a copy (will come by to pick up samples and avs)

## 2019-01-21 NOTE — Progress Notes (Signed)
Gilbert Maes., male    DOB: 03-Aug-1950,   MRN: [id number]   Brief patient profile:  30 yowm MM quit smoking 2013   With GOLD IV/Group D on 02 since around 2015   Baseline wt  170 @ quit smoking   History of Present Illness  11/12/2018  Pulmonary/ 1st office eval/Geoffrey Hynes  Chief Complaint  Patient presents with  . Follow-up    Breathing has been worse over the past 3 wks. He is using his albuterol inhaler 2 x per day and has not used neb.   Dyspnea:  Used to do treadmill at [facility]  slow speed/flat x hour daily while on 3lpm last done  in March 2020  Worse x 3 weeks doe, gradual  Cough: slt green and thick this am only, o/w very little mucus, some upper airway cough though  Sleep: 2 pillows, on side, bed is flat SABA use: twice daily plus / trelegy  02  Sleeps on 2.5 lpm , not using at rest or around the house, adjusts with activity out of house  rec Plan A = Automatic = Always=    Breztri in place of trelegy   Take 2 puffs first thing in am and then another 2 puffs about 12 hours later.  Work on inhaler technique:  Plan B = Backup (to supplement plan A, not to replace it) Only use your albuterol inhaler as a rescue medication Plan C = Crisis (instead of Plan B but only if Plan B stops working) - only use your albuterol nebulizer if you first try Plan B and it fails to help > ok to use the nebulizer up to every 4 hours but if start needing it regularly call for immediate appointment If you don't like the breztri go back to the trelegy  Prednisone 10 mg take  4 each am x 2 days,   2 each am x 2 days,  1 each am x 2 days and stop  Doxycycline 100 mg twice daily until done Flutter valve as needed for cough/ congestion Adjust your 02 flow to keep your sats above 90%     Virtual Visit via Telephone Note 12/24/2018   I connected with Gilbert Maes. on 12/24/18 at 1045 AM EST by telephone and verified that I am speaking with the correct person using two identifiers.   I discussed the  limitations, risks, security and privacy concerns of performing an evaluation and management service by telephone and the availability of in person appointments. I also discussed with the patient that there may be a patient responsible charge related to this service. The patient expressed understanding and agreed to proceed.   History of Present Illness: Back on trelegy ? breztri better but could not afford on his insurance  Dyspnea:  Still doing some deer hunting x 150 ft s standing s 02  Cough: some worse x sev weeks green this am  Sleeping: 2 pillows, no resp symptoms,  SABA use: none needed   02: 2.5 hs -  Does use it at [place] at 3lpm  rec Stay on trelegy daily  Prednisone 10 mg take  4 each am x 2 days,   2 each am x 2 days,  1 each am x 2 days and stop  Doxy 100 mg twice daily x 7 days    01/02/2019  NP rec  Another round of Prednisone > no help   Virtual Visit via Telephone Note 01/21/2019   I connected with Gilbert Fitting  Jr. on 01/21/19 at  8:20 AM EST by telephone and verified that I am speaking with the correct person using two identifiers.   I discussed the limitations, risks, security and privacy concerns of performing an evaluation and management service by telephone and the availability of in person appointments. I also discussed with the patient that there may be a patient responsible charge related to this service. The patient expressed understanding and agreed to proceed.   History of Present Illness: Dyspnea:  Ok at rest not 02 / not on 02 walking around house  Cough: feels congested but not bringing up anything/flutter and neb not helping Sleeping: not waking up but to bathroom even on 02 SABA use: neb helps for few hours  02: 2.5 2lpm hs / 7 months    No obvious day to day or daytime variability or assoc excess/ purulent sputum or mucus plugs or hemoptysis or cp or chest tightness, subjective wheeze or overt sinus or hb symptoms.    Also denies any obvious  fluctuation of symptoms with weather or environmental changes or other aggravating or alleviating factors except as outlined above.   Meds reviewed/ med reconciliation completed        Observations/Objective: Actually sounds good / freq throat clearing        Assessment and Plan: See problem list for active a/p's   Follow Up Instructions: See avs for instructions unique to this ov which includes revised/ updated med list     I discussed the assessment and treatment plan with the patient. The patient was provided an opportunity to ask questions and all were answered. The patient agreed with the plan and demonstrated an understanding of the instructions.   The patient was advised to call back or seek an in-person evaluation if the symptoms worsen or if the condition fails to improve as anticipated.  I provided 25 minutes of non-face-to-face time during this encounter.   Christinia Gully, MD                Past Medical History:  Diagnosis Date  . COPD (chronic obstructive pulmonary disease) ([place])   . Hypertension   . Osteoarthrosis   . Varicose veins

## 2019-01-21 NOTE — Assessment & Plan Note (Signed)
Quit smoking  2013 at wt = 170  Spiro 10/08/12: FeV1 29% FeV1/FVC 41% Alpha one antitrypsin 04/10/2013   MM  Level 145 Spiro 04/10/2013>> FeV1 26% FVC 56% FeV1/FVC 53% - 11/12/2018  After extensive coaching inhaler device,  effectiveness =    90% hfa so try Breztri 2 bid > not on plan so resumed trelegy and flared first week in November 2020 > rx 12/24/2018 with doxy/pred x 6d  - 01/21/2019 changed back to [facility] due to UACS and rx with max gerd rx   DDX of  difficult airways management almost all start with A and  include Adherence, Ace Inhibitors, Acid Reflux, Active Sinus Disease, Alpha 1 Antitripsin deficiency, Anxiety masquerading as Airways dz,  ABPA,  Allergy(esp in young), Aspiration (esp in elderly), Adverse effects of meds,  Active smoking or vaping, A bunch of PE's (a small clot burden can't cause this syndrome unless there is already severe underlying pulm or vascular dz with poor reserve) plus two Bs  = Bronchiectasis and Beta blocker use..and one C= CHF   Adherence is always the initial "prime suspect" and is a multilayered concern that requires a "trust but verify" approach in every patient - starting with knowing how to use medications, especially inhalers, correctly, keeping up with refills and understanding the fundamental difference between maintenance and prns vs those medications only taken for a very short course and then stopped and not refilled.  - return with all meds in hand using a trust but verify approach to confirm accurate Medication  Reconciliation The principal here is that until we are certain that the  patients are doing what we've asked, it makes no sense to ask them to do more.   Adverse effects of dpi > discontinue trelegy and rx breztri samples Advised:  formulary restrictions will be an ongoing challenge for the forseable future and I would be happy to pick an alternative if the pt will first  provide me a list of them -  pt  will need to return here for training  for any new device that is required eg dpi vs hfa vs respimat.    In the meantime we can always provide samples so that the patient never runs out of any needed respiratory medications.   ? Acid (or non-acid) GERD > always difficult to exclude as up to 75% of pts in some series report no assoc GI/ Heartburn symptoms> rec max (24h)  acid suppression and diet restrictions/ reviewed and instructions given in writing.   F/u in 2 weeks

## 2019-01-21 NOTE — Addendum Note (Signed)
Addended by: Hildred Alamin I on: 01/21/2019 09:20 AM   Modules accepted: Orders

## 2019-01-21 NOTE — Addendum Note (Signed)
Addended by: Hildred Alamin I on: 01/21/2019 09:26 AM   Modules accepted: Orders

## 2019-01-21 NOTE — Patient Instructions (Addendum)
Increase you night time to 3lpm and we will order overnight sleep   Make sure you check your oxygen saturations at highest level of activity to be sure it stays over 90% and adjust upward to maintain this level if needed but remember to turn it back to previous settings when you stop (to conserve your supply).   Pick up Breztri samples x 2 weeks and stop trelegy   Only use your albuterol nebulizer as a rescue medication to be used if you can't catch your breath by resting or doing a relaxed purse lip breathing pattern.  - The less you use it, the better it will work when you need it. - Ok to use up to every 4 hours if you must but call for immediate appointment if use goes up over your usual need - Don't leave home without it !!  (think of it like the spare tire for your car)   Pantoprazole (protonix) 40 mg   Take  30-60 min before first meal of the day and Pepcid (famotidine)  20 mg one after supper  until return to office - this is the best way to tell whether stomach acid is contributing to your problem.    GERD (REFLUX)  is an extremely common cause of respiratory symptoms just like yours , many times with no obvious heartburn at all.    It can be treated with medication, but also with lifestyle changes including elevation of the head of your bed (ideally with 6 -8inch blocks under the headboard of your bed),  Smoking cessation, avoidance of late meals, excessive alcohol, and avoid fatty foods, chocolate, peppermint, colas, red wine, and acidic juices such as orange juice.  NO MINT OR MENTHOL PRODUCTS SO NO COUGH DROPS  USE SUGARLESS CANDY INSTEAD (Jolley ranchers or Stover's or Life Savers) or even ice chips will also do - the key is to swallow to prevent all throat clearing. NO OIL BASED VITAMINS - use powdered substitutes.  Avoid fish oil when coughing.    Please schedule a follow up office visit in 2 weeks, sooner if needed  With formulary all medications /inhalers/ solutions in hand so  we can verify exactly what you are taking. This includes all medications from all doctors and over the counters

## 2019-01-27 ENCOUNTER — Encounter: Payer: Self-pay | Admitting: Internal Medicine

## 2019-02-02 DIAGNOSIS — J9611 Chronic respiratory failure with hypoxia: Secondary | ICD-10-CM | POA: Diagnosis not present

## 2019-02-03 ENCOUNTER — Telehealth: Payer: Self-pay | Admitting: Internal Medicine

## 2019-02-03 NOTE — Telephone Encounter (Signed)
Received ONO on 2lpm  Per MW- looks fine on 2lpm  If needing to qualify for o2 can be repeated on RA  ATC the pt, NA and no option to leave msg

## 2019-02-05 MED ORDER — BREZTRI AEROSPHERE 160-9-4.8 MCG/ACT IN AERO: 2.0000 | INHALATION_SPRAY | Freq: Two times a day (BID) | RESPIRATORY_TRACT | 0 refills | Status: DC

## 2019-02-05 NOTE — Telephone Encounter (Signed)
Spoke with the pt and notified of ONO results  He verbalized understanding  He states that insurance already covers o2 so ONO on RA not needed at this time

## 2019-02-19 ENCOUNTER — Encounter: Payer: Self-pay | Admitting: Primary Care

## 2019-02-19 ENCOUNTER — Ambulatory Visit (INDEPENDENT_AMBULATORY_CARE_PROVIDER_SITE_OTHER): Payer: Medicare Other | Admitting: Primary Care

## 2019-02-19 ENCOUNTER — Ambulatory Visit: Payer: Medicare Other | Admitting: Internal Medicine

## 2019-02-19 ENCOUNTER — Other Ambulatory Visit: Payer: Self-pay

## 2019-02-19 ENCOUNTER — Telehealth: Payer: Self-pay | Admitting: Primary Care

## 2019-02-19 VITALS — BP 138/62 | HR 96 | Temp 97.0°F | Ht 68.0 in | Wt 214.0 lb

## 2019-02-19 DIAGNOSIS — J449 Chronic obstructive pulmonary disease, unspecified: Secondary | ICD-10-CM

## 2019-02-19 DIAGNOSIS — J9611 Chronic respiratory failure with hypoxia: Secondary | ICD-10-CM

## 2019-02-19 NOTE — Telephone Encounter (Signed)
Spoke with Carlette with Pulmonary Rehab. Advised her that [facility] is not doing pulmonary rehab at this time. She states that the pt has done their program before and will need a new order for the maintenance program. This order will need to be signed under [facility]. This order has been placed.

## 2019-02-19 NOTE — Assessment & Plan Note (Signed)
-   ONO on 2L in December 2020 with no desaturation - Continue nocturnal oxygen at 2L

## 2019-02-19 NOTE — Assessment & Plan Note (Addendum)
-   Breathing/shortness of breath no better on Breztri and prescription was too extensive. No symptoms of acute exacerbation. Denies cough, mucus production or wheezing.  - Resume Trelegy 1 puff daily; continue albuterol hfa/neb q 6 hours prn sob/wheezing - Refer back to pulmonary rehab  - FU in 3 months or sooner if needed

## 2019-02-19 NOTE — Patient Instructions (Addendum)
COPD: - Resume Trelegy -1 puff once daily - Continue albuterol rescue inhaler 2 puffs every 4-6 hours as needed for breakthrough shortness of breath or wheezing  Oxygen: - Continue to use oxygen during the day as needed to keep O2 greater than 88 to 90% - Use 2 L of oxygen at night  Referral: - Pulmonary rehab in [place]   Follow-up: - 2 to 3 months with Dr. Melvyn Novas  - OR sooner if he develops worsening respiratory symptoms or cough with purulent mucus

## 2019-02-19 NOTE — Progress Notes (Signed)
@Patient  ID: Gilbert Maes., male    DOB: 1950/05/13, 69 y.o.   MRN: [id number]  Chief Complaint  Patient presents with  . Follow-up    sob with exertion-same,no cough or wheeze    Referring provider: Maylon Peppers, MD  HPI: 69 year old male, former smoker quit in 2013 (67 pack-year history).  Past medical history significant for COPD Gold 4, chronic respiratory failure with hypoxemia (O2 dependent), Mycobacterium tuberculosis infection, hypertension, obesity.  Patient of Dr. Melvyn Novas, last seen 01/21/2019. Overnight oximetry fine on 2L. Trelegy was changed to BREZTRI.   02/19/2019 Patient presents today for 2-week follow-up office visit. He has seen no noticeable improvement in breathing/COPD symptoms since starting back on Breztri. Medication is also not covered by his insurance and will cost him upwards for $700 dollars. He would like to return back to Trelegy. Uses his Albuterol rescue inhaler 1-2 times a day. No reports of significant cough or mucus production. He usually does not require oxygen during the day when sitting at rest, uses when out of the house. Continues to wear 2L oxygen at night. He has been to pulmonary rehab in the past but it has been awhile since he last went. He would like to resume exercise routine but stopped d/t covid pandemic.  Allergies  Allergen Reactions  . Azithromycin     Wife noted memory loss when patient was on this chronically    Immunization History  Administered Date(s) Administered  . Fluad Quad(high Dose 65+) 11/12/2018  . Influenza Split 01/10/2012  . Influenza, High Dose Seasonal PF 01/10/2012, 10/08/2012, 10/23/2013, 10/19/2014, 11/28/2015, 10/23/2016, 02/11/2018  . Influenza,inj,Quad PF,6+ Mos 10/08/2012, 10/23/2013, 10/19/2014, 11/28/2015  . Influenza-Unspecified 11/11/2015, 10/23/2016  . Pneumococcal Conjugate-13 01/09/2012, 12/21/2013  . Pneumococcal Polysaccharide-23 01/09/2012  . Zoster 05/27/2015    Past Medical History:    Diagnosis Date  . COPD (chronic obstructive pulmonary disease) ([place])   . Hypertension   . Osteoarthrosis   . Varicose veins     Tobacco History: Social History   Tobacco Use  Smoking Status Former Smoker  . Packs/day: 1.50  . Years: 45.00  . Pack years: 67.50  . Types: Cigarettes  . Quit date: 12/31/2011  . Years since quitting: 7.1  Smokeless Tobacco Never Used   Counseling given: Not Answered   Outpatient Medications Prior to Visit  Medication Sig Dispense Refill  . albuterol (PROVENTIL) (2.5 MG/3ML) 0.083% nebulizer solution Take 3 mLs (2.5 mg total) by nebulization every 6 (six) hours as needed for wheezing or shortness of breath. 360 mL 11  . albuterol (VENTOLIN HFA) 108 (90 Base) MCG/ACT inhaler Inhale 1 puff into the lungs every 6 (six) hours as needed for wheezing or shortness of breath. 1 Inhaler 4  . Budeson-Glycopyrrol-Formoterol (BREZTRI AEROSPHERE) 160-9-4.8 MCG/ACT AERO Inhale 2 puffs into the lungs 2 (two) times daily.    . busPIRone (BUSPAR) 15 MG tablet Take 0.5 tablets (7.5 mg total) by mouth 2 (two) times daily. 90 tablet 3  . famotidine (PEPCID) 20 MG tablet One after supper 30 tablet 11  . OXYGEN Oxygen 2.5 lpm with sleep and as needed during the day, 3lpm with exertion    . pantoprazole (PROTONIX) 40 MG tablet Take 1 tablet (40 mg total) by mouth daily. Take 30-60 min before first meal of the day 30 tablet 2  . Respiratory Therapy Supplies (FLUTTER) DEVI Use as directed 1 each 0  . Budeson-Glycopyrrol-Formoterol (BREZTRI AEROSPHERE) 160-9-4.8 MCG/ACT AERO Inhale 2 puffs into the lungs 2 (two) times  daily. (Patient not taking: Reported on 02/19/2019) 10.7 g 0  . Budeson-Glycopyrrol-Formoterol (BREZTRI AEROSPHERE) 160-9-4.8 MCG/ACT AERO Inhale 2 puffs into the lungs 2 (two) times daily. (Patient not taking: Reported on 02/19/2019) 5.9 g 0   No facility-administered medications prior to visit.   Review of Systems  Review of Systems  Constitutional: Negative.    Respiratory: Positive for shortness of breath. Negative for cough, chest tightness and wheezing.   Cardiovascular: Negative.    Physical Exam  BP 138/62 (BP Location: Left Arm, Cuff Size: Normal)   Pulse 96   Temp (!) 97 F (36.1 C) (Temporal)   Ht 5\' 8"  (1.727 m)   Wt 214 lb (97.1 kg)   SpO2 96%   BMI 32.54 kg/m  Physical Exam Constitutional:      General: He is not in acute distress.    Appearance: Normal appearance. He is not ill-appearing.  Cardiovascular:     Rate and Rhythm: Normal rate and regular rhythm.  Pulmonary:     Effort: Pulmonary effort is normal.     Breath sounds: No wheezing or rhonchi.     Comments: Clear, diminished t/o. Mild dyspnea. On pulsed oxygen 2l.  Musculoskeletal:        General: Normal range of motion.  Neurological:     General: No focal deficit present.     Mental Status: He is alert and oriented to person, place, and time. Mental status is at baseline.  Psychiatric:        Mood and Affect: Mood normal.        Behavior: Behavior normal.        Thought Content: Thought content normal.        Judgment: Judgment normal.      Lab Results:  CBC    Component Value Date/Time   WBC 6.7 10/23/2016 0954   RBC 5.27 10/23/2016 0954   HGB 15.7 10/23/2016 0954   HCT 46.8 10/23/2016 0954   PLT 268.0 10/23/2016 0954   MCV 88.8 10/23/2016 0954   MCH 31.6 04/28/2010 1621   MCHC 33.5 10/23/2016 0954   RDW 14.2 10/23/2016 0954   LYMPHSABS 1.1 10/23/2016 0954   MONOABS 0.6 10/23/2016 0954   EOSABS 0.1 10/23/2016 0954   BASOSABS 0.0 10/23/2016 0954    BMET    Component Value Date/Time   NA 140 04/28/2010 1644   K 5.2 (H) 04/28/2010 1644   CL 105 04/28/2010 1644   GLUCOSE 80 04/28/2010 1644   BUN 17 04/28/2010 1644   CREATININE 1.0 04/28/2010 1644    BNP No results found for: BNP  ProBNP No results found for: PROBNP  Imaging: No results found.   Assessment & Plan:   COPD GOLD IV/ 02 dep  - Breathing/shortness of breath no  better on Breztri and prescription was too extensive. No symptoms of acute exacerbation. Denies cough, mucus production or wheezing.  - Resume Trelegy 1 puff daily; continue albuterol hfa/neb q 6 hours prn sob/wheezing - Refer back to pulmonary rehab  - FU in 3 months or sooner if needed  Chronic respiratory failure with hypoxia ([place]) - ONO on 2L in December 2020 with no desaturation - Continue nocturnal oxygen at [place], NP 02/19/2019

## 2019-02-24 ENCOUNTER — Telehealth (HOSPITAL_COMMUNITY): Payer: Self-pay | Admitting: *Deleted

## 2019-02-24 NOTE — Telephone Encounter (Signed)
Called and spoke to pt who is well known by our pulmonary rehab staff.  Pt participated previously in our pulmonary maintenance program for several year. Pt wants to get reestablished with exercise.  Pt exercised at the [facility] until they closed due to Covid.  Although the [facility] has reopened, he does not feel comfortable in that setting. Advised pt that presently we have paused our onsite cardiac and pulmonary rehab program due to deployment of staff to areas inpatient and in the community in response to the surge of covid+ patients. Unsure when we will phase in maintenance patients. Pt asked about the fitness center at [facility].  Pt states they are closer to me than you are.  Printed off information for pt to preview regarding the fitness center at [facility]. Cautioned pt that like our rehab program, community gym and fitness center, he must wear a mask during exercise.  Pt stated that he could wear one over his mouth but not his nose.  Suggested trying different face mask that are made specifically for exercise to see if he could tolerate. Mailed information to pt.  Await a response from pt on how he would like to proceed. Cherre Huger, BSN Cardiac and Training and development officer

## 2019-03-02 ENCOUNTER — Ambulatory Visit: Payer: Medicare Other | Admitting: Internal Medicine

## 2019-04-14 ENCOUNTER — Other Ambulatory Visit: Payer: Self-pay | Admitting: Internal Medicine

## 2019-04-14 DIAGNOSIS — J449 Chronic obstructive pulmonary disease, unspecified: Secondary | ICD-10-CM

## 2019-04-18 ENCOUNTER — Other Ambulatory Visit: Payer: Self-pay | Admitting: Pulmonary Disease

## 2019-04-20 DIAGNOSIS — G8929 Other chronic pain: Secondary | ICD-10-CM | POA: Diagnosis not present

## 2019-04-20 DIAGNOSIS — H5711 Ocular pain, right eye: Secondary | ICD-10-CM | POA: Diagnosis not present

## 2019-04-20 DIAGNOSIS — Z0000 Encounter for general adult medical examination without abnormal findings: Secondary | ICD-10-CM | POA: Diagnosis not present

## 2019-04-20 DIAGNOSIS — M25511 Pain in right shoulder: Secondary | ICD-10-CM | POA: Diagnosis not present

## 2019-04-20 DIAGNOSIS — J9611 Chronic respiratory failure with hypoxia: Secondary | ICD-10-CM | POA: Diagnosis not present

## 2019-04-20 DIAGNOSIS — F4323 Adjustment disorder with mixed anxiety and depressed mood: Secondary | ICD-10-CM | POA: Diagnosis not present

## 2019-04-20 DIAGNOSIS — Z9981 Dependence on supplemental oxygen: Secondary | ICD-10-CM | POA: Diagnosis not present

## 2019-04-29 ENCOUNTER — Other Ambulatory Visit: Payer: Self-pay | Admitting: Pulmonary Disease

## 2019-04-29 DIAGNOSIS — J9611 Chronic respiratory failure with hypoxia: Secondary | ICD-10-CM

## 2019-04-29 DIAGNOSIS — J449 Chronic obstructive pulmonary disease, unspecified: Secondary | ICD-10-CM

## 2019-05-21 ENCOUNTER — Ambulatory Visit (INDEPENDENT_AMBULATORY_CARE_PROVIDER_SITE_OTHER): Payer: Medicare Other | Admitting: Primary Care

## 2019-05-21 ENCOUNTER — Encounter: Payer: Self-pay | Admitting: Primary Care

## 2019-05-21 ENCOUNTER — Ambulatory Visit: Payer: Medicare Other | Admitting: Internal Medicine

## 2019-05-21 ENCOUNTER — Other Ambulatory Visit: Payer: Self-pay

## 2019-05-21 DIAGNOSIS — J9611 Chronic respiratory failure with hypoxia: Secondary | ICD-10-CM | POA: Diagnosis not present

## 2019-05-21 DIAGNOSIS — J449 Chronic obstructive pulmonary disease, unspecified: Secondary | ICD-10-CM

## 2019-05-21 MED ORDER — FLUTICASONE PROPIONATE 50 MCG/ACT NA SUSP: 1.0000 | Freq: Every day | NASAL | 2 refills | Status: AC

## 2019-05-21 MED ORDER — LORATADINE 10 MG PO TABS: 10.0000 mg | ORAL_TABLET | Freq: Every day | ORAL | 1 refills | Status: DC

## 2019-05-21 NOTE — Progress Notes (Signed)
@Patient  ID: Gilbert Jefferson., male    DOB: Jul 16, 1950, 69 y.o.   MRN: [id number]  Chief Complaint  Patient presents with  . Follow-up    Increased sob good/bad days,wheezing in am    Referring provider: Maylon Peppers, MD  HPI: 69 year old male, former smoker quit in 2013 (67 pack-year history).  Past medical history significant for COPD Gold 4, chronic respiratory failure with hypoxemia (O2 dependent), Mycobacterium tuberculosis infection, hypertension, obesity.  Patient of Dr. Melvyn Novas, last seen 01/21/2019. Overnight oximetry fine on 2L. Trelegy was changed to [facility].   Previous  02/19/2019 Patient presents today for 2-week follow-up office visit. He has seen no noticeable improvement in breathing/COPD symptoms since starting back on Breztri. Medication is also not covered by his insurance and will cost him upwards for $700 dollars. He would like to return back to Trelegy. Uses his Albuterol rescue inhaler 1-2 times a day. No reports of significant cough or mucus production. He usually does not require oxygen during the day when sitting at rest, uses when out of the house. Continues to wear 2L oxygen at night. He has been to pulmonary rehab in the past but it has been awhile since he last went. He would like to resume exercise routine but stopped d/t covid pandemic.  05/21/2019 Patient presents today for 3 month follow-up. He has not had any recent COPD exacerbation or hospitalizations since his last visit. States that he has his good days and bad when it comes to his breathing. He has attended pulmonary rehab, interested in returning but would like to go to [facility]. He saw no difference in his breathing compared to Trelegy which is more affordable. He uses albuterol hfa one puff twice a day with improvement . He remains on 2L oxygen continuous. He wears oxygen at night. He had an ONO back in December 2020 which showed no desaturation. States that the pollen has seemed to worsen his  COPD symptoms. He has some post nasal drip/nasal congestion. He is not currently taking any over the counter medication. Denies chest tightness, wheezing and cough. He has had both covid vaccines.    Allergies  Allergen Reactions  . Azithromycin     Wife noted memory loss when patient was on this chronically    Immunization History  Administered Date(s) Administered  . Fluad Quad(high Dose 65+) 11/12/2018  . Influenza Split 01/10/2012  . Influenza, High Dose Seasonal PF 01/10/2012, 10/08/2012, 10/23/2013, 10/19/2014, 11/28/2015, 10/23/2016, 02/11/2018  . Influenza,inj,Quad PF,6+ Mos 10/08/2012, 10/23/2013, 10/19/2014, 11/28/2015  . Influenza-Unspecified 11/11/2015, 10/23/2016  . PFIZER SARS-COV-2 Vaccination 05/08/2019  . Pneumococcal Conjugate-13 01/09/2012, 12/21/2013  . Pneumococcal Polysaccharide-23 01/09/2012  . Zoster 05/27/2015    Past Medical History:  Diagnosis Date  . COPD (chronic obstructive pulmonary disease) ([place])   . Hypertension   . Osteoarthrosis   . Varicose veins     Tobacco History: Social History   Tobacco Use  Smoking Status Former Smoker  . Packs/day: 1.50  . Years: 45.00  . Pack years: 67.50  . Types: Cigarettes  . Quit date: 12/31/2011  . Years since quitting: 7.3  Smokeless Tobacco Never Used   Counseling given: Not Answered   Outpatient Medications Prior to Visit  Medication Sig Dispense Refill  . albuterol (PROVENTIL) (2.5 MG/3ML) 0.083% nebulizer solution Take 3 mLs (2.5 mg total) by nebulization every 6 (six) hours as needed for wheezing or shortness of breath. 360 mL 11  . albuterol (VENTOLIN HFA) 108 (90 Base) MCG/ACT inhaler INHALE  1 PUFF INTO THE LUNGS EVERY 6 (SIX) HOURS AS NEEDED FOR WHEEZING OR SHORTNESS OF BREATH. 8 g 4  . busPIRone (BUSPAR) 15 MG tablet Take 0.5 tablets (7.5 mg total) by mouth 2 (two) times daily. 90 tablet 3  . OXYGEN Oxygen 2.5 lpm with sleep and as needed during the day, 3lpm with exertion    . Respiratory  Therapy Supplies (FLUTTER) DEVI Use as directed 1 each 0  . TRELEGY ELLIPTA 100-62.5-25 MCG/INH AEPB TAKE 1 PUFF BY MOUTH EVERY DAY 60 each 5  . famotidine (PEPCID) 20 MG tablet One after supper (Patient not taking: Reported on 05/21/2019) 30 tablet 11  . pantoprazole (PROTONIX) 40 MG tablet TAKE 1 TABLET (40 MG TOTAL) BY MOUTH DAILY. TAKE 30-60 MIN BEFORE FIRST MEAL OF THE DAY (Patient not taking: Reported on 05/21/2019) 90 tablet 2  . Budeson-Glycopyrrol-Formoterol (BREZTRI AEROSPHERE) 160-9-4.8 MCG/ACT AERO Inhale 2 puffs into the lungs 2 (two) times daily. (Patient not taking: Reported on 05/21/2019)    . Budeson-Glycopyrrol-Formoterol (BREZTRI AEROSPHERE) 160-9-4.8 MCG/ACT AERO Inhale 2 puffs into the lungs 2 (two) times daily. (Patient not taking: Reported on 02/19/2019) 10.7 g 0  . Budeson-Glycopyrrol-Formoterol (BREZTRI AEROSPHERE) 160-9-4.8 MCG/ACT AERO Inhale 2 puffs into the lungs 2 (two) times daily. (Patient not taking: Reported on 02/19/2019) 5.9 g 0   No facility-administered medications prior to visit.   Review of Systems  Review of Systems  Constitutional: Negative.   Respiratory: Positive for shortness of breath. Negative for cough, chest tightness and wheezing.    Physical Exam  BP 136/68 (BP Location: Right Arm, Cuff Size: Normal)   Pulse 63   Temp (!) 97 F (36.1 C) (Temporal)   Ht 5\' 8"  (1.727 m)   Wt 212 lb 9.6 oz (96.4 kg)   SpO2 100%   BMI 32.33 kg/m  Physical Exam Constitutional:      General: He is not in acute distress.    Appearance: Normal appearance. He is not ill-appearing.  Cardiovascular:     Rate and Rhythm: Normal rate and regular rhythm.     Comments: No edema Pulmonary:     Effort: Pulmonary effort is normal.     Breath sounds: No wheezing.     Comments: LS diminished t/o; on 2L pulsed oxygen  Neurological:     General: No focal deficit present.     Mental Status: He is alert and oriented to person, place, and time. Mental status is at  baseline.  Psychiatric:        Mood and Affect: Mood normal.        Behavior: Behavior normal.        Thought Content: Thought content normal.        Judgment: Judgment normal.      Lab Results:  CBC    Component Value Date/Time   WBC 6.7 10/23/2016 0954   RBC 5.27 10/23/2016 0954   HGB 15.7 10/23/2016 0954   HCT 46.8 10/23/2016 0954   PLT 268.0 10/23/2016 0954   MCV 88.8 10/23/2016 0954   MCH 31.6 04/28/2010 1621   MCHC 33.5 10/23/2016 0954   RDW 14.2 10/23/2016 0954   LYMPHSABS 1.1 10/23/2016 0954   MONOABS 0.6 10/23/2016 0954   EOSABS 0.1 10/23/2016 0954   BASOSABS 0.0 10/23/2016 0954    BMET    Component Value Date/Time   NA 140 04/28/2010 1644   K 5.2 (H) 04/28/2010 1644   CL 105 04/28/2010 1644   GLUCOSE 80 04/28/2010 1644   BUN  17 04/28/2010 1644   CREATININE 1.0 04/28/2010 1644    BNP No results found for: BNP  ProBNP No results found for: PROBNP  Imaging: No results found.   Assessment & Plan:   COPD GOLD IV/ 02 dep  - Breathing is baseline, no recent exacerbations. Experiences dyspnea on exertion. No chest tightness, wheezing or cough  - Continue Trelegy 1 puff daily (rinse mouth after use) - Use albuterol inhaler OR nebulizer every 4-6 hours as needed for breakthrough shortness of breath or wheezing - Plan re-refer to Pulmonary rehab in [facility] if taking new patients  - Follow-up: 4 months with Dr. Melvyn Novas    Chronic respiratory failure with hypoxia ([place]) - Stable on 2L oxygen continuous, ONO in Dec 2020 showed no nocturnal desaturation   Martyn Ehrich, NP 05/21/2019

## 2019-05-21 NOTE — Patient Instructions (Addendum)
Recommendations: - Continue Trelegy 1 puff daily (rinse mouth after use) - Use albuterol inhaler OR nebulizer every 4-6 hours as needed for breakthrough shortness of breath or wheezing - Adding Claritin and flonase nasal spray for seasonal allergic rhinitis - take as needed for the next 2-4 weeks (prescription sent to pharmacy) - Continue 2L oxygen continuously during the day and at night   Referral: - Pulmonary rehab in [place] only if taking new patient   Follow-up: - 4 months with Dr. Melvyn Novas    COPD and Physical Activity Chronic obstructive pulmonary disease (COPD) is a long-term (chronic) condition that affects the lungs. COPD is a general term that can be used to describe many different lung problems that cause lung swelling (inflammation) and limit airflow, including chronic bronchitis and emphysema. The main symptom of COPD is shortness of breath, which makes it harder to do even simple tasks. This can also make it harder to exercise and be active. Talk with your health care provider about treatments to help you breathe better and actions you can take to prevent breathing problems during physical activity. What are the benefits of exercising with COPD? Exercising regularly is an important part of a healthy lifestyle. You can still exercise and do physical activities even though you have COPD. Exercise and physical activity improve your shortness of breath by increasing blood flow (circulation). This causes your heart to pump more oxygen through your body. Moderate exercise can improve your:  Oxygen use.  Energy level.  Shortness of breath.  Strength in your breathing muscles.  Heart health.  Sleep.  Self-esteem and feelings of self-worth.  Depression, stress, and anxiety levels. Exercise can benefit everyone with COPD. The severity of your disease may affect how hard you can exercise, especially at first, but everyone can benefit. Talk with your health care provider  about how much exercise is safe for you, and which activities and exercises are safe for you. What actions can I take to prevent breathing problems during physical activity?  Sign up for a pulmonary rehabilitation program. This type of program may include: ? Education about lung diseases. ? Exercise classes that teach you how to exercise and be more active while improving your breathing. This usually involves:  Exercise using your lower extremities, such as a stationary bicycle.  About 30 minutes of exercise, 2 to 5 times per week, for 6 to 12 weeks  Strength training, such as push ups or leg lifts. ? Nutrition education. ? Group classes in which you can talk with others who also have COPD and learn ways to manage stress.  If you use an oxygen tank, you should use it while you exercise. Work with your health care provider to adjust your oxygen for your physical activity. Your resting flow rate is different from your flow rate during physical activity.  While you are exercising: ? Take slow breaths. ? Pace yourself and do not try to go too fast. ? Purse your lips while breathing out. Pursing your lips is similar to a kissing or whistling position. ? If doing exercise that uses a quick burst of effort, such as weight lifting:  Breathe in before starting the exercise.  Breathe out during the hardest part of the exercise (such as raising the weights). Where to find support You can find support for exercising with COPD from:  Your health care provider.  A pulmonary rehabilitation program.  Your local health department or community health programs.  Support groups, online or in-person. Your  health care provider may be able to recommend support groups. Where to find more information You can find more information about exercising with COPD from:  American Lung Association: ClassInsider.se.  COPD Foundation: https://www.rivera.net/. Contact a health care provider if:  Your symptoms get  worse.  You have chest pain.  You have nausea.  You have a fever.  You have trouble talking or catching your breath.  You want to start a new exercise program or a new activity. Summary  COPD is a general term that can be used to describe many different lung problems that cause lung swelling (inflammation) and limit airflow. This includes chronic bronchitis and emphysema.  Exercise and physical activity improve your shortness of breath by increasing blood flow (circulation). This causes your heart to provide more oxygen to your body.  Contact your health care provider before starting any exercise program or new activity. Ask your health care provider what exercises and activities are safe for you. This information is not intended to replace advice given to you by your health care provider. Make sure you discuss any questions you have with your health care provider. Document Revised: 05/07/2018 Document Reviewed: 02/07/2017 Elsevier Patient Education  2020 [employer].

## 2019-05-21 NOTE — Assessment & Plan Note (Addendum)
-   Breathing is baseline, no recent exacerbations. Experiences dyspnea on exertion. No chest tightness, wheezing or cough  - Continue Trelegy 1 puff daily (rinse mouth after use) - Use albuterol inhaler OR nebulizer every 4-6 hours as needed for breakthrough shortness of breath or wheezing - Plan re-refer to Pulmonary rehab in [facility] if taking new patients  - Follow-up: 4 months with Dr. Melvyn Novas

## 2019-05-21 NOTE — Assessment & Plan Note (Addendum)
-   Stable on 2L oxygen continuous, ONO in Dec 2020 showed no nocturnal desaturation

## 2019-06-03 ENCOUNTER — Telehealth: Payer: Self-pay | Admitting: Internal Medicine

## 2019-06-03 DIAGNOSIS — R1013 Epigastric pain: Secondary | ICD-10-CM | POA: Diagnosis not present

## 2019-06-03 DIAGNOSIS — R140 Abdominal distension (gaseous): Secondary | ICD-10-CM | POA: Diagnosis not present

## 2019-06-03 MED ORDER — PREDNISONE 10 MG PO TABS: ORAL_TABLET | ORAL | 0 refills | Status: DC

## 2019-06-03 NOTE — Telephone Encounter (Signed)
Called and spoke with pt letting him know that we were going to send pred taper to pharmacy for him. Stated to him after rx if still having problems that we would need to schedule a visit and pt verbalized understanding. Verified pt's preferred pharmacy and sent rx in for pt. Nothing further needed.

## 2019-06-03 NOTE — Telephone Encounter (Signed)
Prednisone 10 mg take  4 each am x 2 days,   2 each am x 2 days,  1 each am x 2 days and stop  

## 2019-06-03 NOTE — Telephone Encounter (Signed)
Called and spoke with pt who is requesting a pred rx. Pt stated over the past week, his breathing has been worse. Pt states he has been using the nebulizer at least 2 times daily which he states has helped for short term. Pt states he has had to use his rescue inhaler at least three times daily.  Pt denies any complaints of fever. Pt states he has had some mild wheezing and states he has had tightness in chest. Pt states at night he has coughed occ.  Dr. Melvyn Novas, please advise if you are okay prescribing prednisone for pt as he states this usually helps when he has a flare up.

## 2019-06-04 DIAGNOSIS — R140 Abdominal distension (gaseous): Secondary | ICD-10-CM | POA: Diagnosis not present

## 2019-06-04 DIAGNOSIS — K760 Fatty (change of) liver, not elsewhere classified: Secondary | ICD-10-CM | POA: Diagnosis not present

## 2019-06-04 DIAGNOSIS — R1013 Epigastric pain: Secondary | ICD-10-CM | POA: Diagnosis not present

## 2019-06-08 DIAGNOSIS — D72829 Elevated white blood cell count, unspecified: Secondary | ICD-10-CM | POA: Diagnosis not present

## 2019-06-12 ENCOUNTER — Other Ambulatory Visit: Payer: Self-pay | Admitting: Primary Care

## 2019-06-27 ENCOUNTER — Other Ambulatory Visit: Payer: Self-pay | Admitting: Primary Care

## 2019-07-07 ENCOUNTER — Telehealth: Payer: Self-pay | Admitting: Internal Medicine

## 2019-07-07 NOTE — Telephone Encounter (Signed)
Spoke with Gilbert Jefferson. He received a letter from [facility] that he will have to requalified for his oxygen before 09/28/2019. Gilbert Jefferson has an appointment with Dr. Melvyn Novas in August, advised him that we could do his walk test then or we could move his appointment up. He is fine with waiting until his appointment in August. Nothing further was needed.

## 2019-07-29 ENCOUNTER — Ambulatory Visit (INDEPENDENT_AMBULATORY_CARE_PROVIDER_SITE_OTHER): Payer: Medicare Other | Admitting: Cardiovascular Disease

## 2019-07-29 ENCOUNTER — Other Ambulatory Visit: Payer: Self-pay

## 2019-07-29 ENCOUNTER — Encounter: Payer: Self-pay | Admitting: Cardiovascular Disease

## 2019-07-29 VITALS — BP 110/72 | HR 93 | Ht 68.0 in | Wt 205.8 lb

## 2019-07-29 DIAGNOSIS — I700 Atherosclerosis of aorta: Secondary | ICD-10-CM | POA: Diagnosis not present

## 2019-07-29 DIAGNOSIS — I2510 Atherosclerotic heart disease of native coronary artery without angina pectoris: Secondary | ICD-10-CM

## 2019-07-29 MED ORDER — ASPIRIN EC 81 MG PO TBEC: 81.0000 mg | DELAYED_RELEASE_TABLET | Freq: Every day | ORAL | 3 refills | Status: AC

## 2019-07-29 MED ORDER — ROSUVASTATIN CALCIUM 5 MG PO TABS: 5.0000 mg | ORAL_TABLET | Freq: Every day | ORAL | 3 refills | Status: DC

## 2019-07-29 NOTE — Patient Instructions (Signed)
Medication Instructions:  Your physician has recommended you make the following change in your medication:  1.) start aspirin 81 mg (enteric coated) - one tablet daily 2.) start rosuvastatin (Crestor) 5 mg - one tablet daily  *If you need a refill on your cardiac medications before your next appointment, please call your pharmacy*   Lab Work: In 12 weeks - fasting lipids, liver function  Testing/Procedures: Your physician has requested that you have an echocardiogram. Echocardiography is a painless test that uses sound waves to create images of your heart. It provides your doctor with information about the size and shape of your heart and how well your heart's chambers and valves are working. This procedure takes approximately one hour. There are no restrictions for this procedure.   Follow-Up: At [facility], you and your health needs are our priority.  As part of our continuing mission to provide you with exceptional heart care, we have created designated Provider Care Teams.  These Care Teams include your primary Cardiologist (physician) and Advanced Practice Providers (APPs -  Physician Assistants and Nurse Practitioners) who all work together to provide you with the care you need, when you need it.  Your next appointment:   12 month(s)  The format for your next appointment:   In Person  Provider:   You may see Lauree Chandler, MD or one of the following Advanced Practice Providers on your designated Care Team:    Melina Copa, PA-C  Ermalinda Barrios, PA-C    Other Instructions

## 2019-07-29 NOTE — Progress Notes (Signed)
Chief Complaint  Patient presents with  . New Patient (Initial Visit)    aortic atherosclerosis      History of Present Illness: 69 yo male with history of aortic atherosclerosis, COPD, former tobacco abuse, HTN and morbid obesity who is here today as a new consult, referred by Dr. Lenna [place], to establish cardiology care given finding of aortic and LAD atherosclerosis seen on CTA. He tells me today that he feels great. He has baseline dyspnea due to his COPD. No chest pain.   Primary Care Physician: Maylon Peppers, MD   Past Medical History:  Diagnosis Date  . Abdominal bloating   . Atherosclerosis of aorta ([place])   . Chronic respiratory failure with hypoxia ([facility]) 04/10/2013   Exertional hypoxemia 04/10/2013  - ONO 2 lpm 01/28/2019 >>> sats ok , no desat      . COPD (chronic obstructive pulmonary disease) ([place])   . COPD GOLD IV/ 02 dep     Quit smoking  2013 at wt = 170  Spiro 10/08/12: FeV1 29% FeV1/FVC 41% Alpha one antitrypsin 04/10/2013   MM  Level 145 Spiro 04/10/2013>> FeV1 26% FVC 56% FeV1/FVC 53% - 11/12/2018  After extensive coaching inhaler device,  effectiveness =    90% hfa so try Breztri 2 bid > not on plan so resumed trelegy and flared first week in November 2020 > rx 12/24/2018 with doxy/pred x 6d  - 01/21/2019 changed b  . Epigastric abdominal pain   . Flank pain 03/24/2015  . Hypertension   . Morbid obesity due to excess calories ([place])  03/20/2016   Wt 170 at smoking cessation   . Mycobacterium tuberculosis infection 05/01/2016   03/2016 AFB sputum culture positive  . Mycobacterium tuberculosis infection   . Osteoarthritis of multiple joints   . Osteoarthrosis   . Oxygen deficiency   . Tobacco abuse, in remission 01/04/2012   No smoking since December 2013   . Varicose veins     Past Surgical History:  Procedure Laterality Date  . none      Current Outpatient Medications  Medication Sig Dispense Refill  . albuterol (PROVENTIL) (2.5 MG/3ML) 0.083% nebulizer solution  Take 3 mLs (2.5 mg total) by nebulization every 6 (six) hours as needed for wheezing or shortness of breath. 360 mL 11  . albuterol (VENTOLIN HFA) 108 (90 Base) MCG/ACT inhaler INHALE 1 PUFF INTO THE LUNGS EVERY 6 (SIX) HOURS AS NEEDED FOR WHEEZING OR SHORTNESS OF BREATH. 8 g 4  . busPIRone (BUSPAR) 15 MG tablet Take 0.5 tablets (7.5 mg total) by mouth 2 (two) times daily. 90 tablet 3  . fluticasone (FLONASE) 50 MCG/ACT nasal spray Place 1 spray into both nostrils daily. 16 g 2  . loratadine (CLARITIN) 10 MG tablet TAKE 1 TABLET BY MOUTH EVERY DAY 90 tablet 1  . OXYGEN Oxygen 2.5 lpm with sleep and as needed during the day, 3lpm with exertion    . Respiratory Therapy Supplies (FLUTTER) DEVI Use as directed 1 each 0  . TRELEGY ELLIPTA 100-62.5-25 MCG/INH AEPB TAKE 1 PUFF BY MOUTH EVERY DAY 60 each 5  . aspirin EC 81 MG tablet Take 1 tablet (81 mg total) by mouth daily. Swallow whole. 90 tablet 3  . rosuvastatin (CRESTOR) 5 MG tablet Take 1 tablet (5 mg total) by mouth daily. 90 tablet 3   No current facility-administered medications for this visit.    Allergies  Allergen Reactions  . Azithromycin     Wife noted memory loss when  patient was on this chronically    Social History   Socioeconomic History  . Marital status: Married    Spouse name: Not on file  . Number of children: 6  . Years of education: Not on file  . Highest education level: Not on file  Occupational History  . 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 Smoking status: Former Smoker    Packs/day: 1.50    Years: 45.00    Pack years: 67.50    Types: Cigarettes    Quit date: 12/31/2011    Years since quitting: 7.5  . Smokeless tobacco: Never Used  Vaping Use  . Vaping Use: Never used  Substance and Sexual Activity  . Alcohol use: No  . Drug use: No  . Sexual activity: Not on file  Other Topics Concern  . Not on file  Social History Narrative  . Not on file   Social Determinants  of Health   Financial Resource Strain:   . Difficulty of Paying Living Expenses:   Food Insecurity:   . Worried About Charity fundraiser in the Last Year:   . Arboriculturist in the Last Year:   Transportation Needs:   . Film/video editor (Medical):   Marland Kitchen Lack of Transportation (Non-Medical):   Physical Activity:   . Days of Exercise per Week:   . Minutes of Exercise per Session:   Stress:   . Feeling of Stress :   Social Connections:   . Frequency of Communication with Friends and Family:   . Frequency of Social Gatherings with Friends and Family:   . Attends Religious Services:   . Active Member of Clubs or Organizations:   . Attends Archivist Meetings:   Marland Kitchen Marital Status:   Intimate Partner Violence:   . Fear of Current or Ex-Partner:   . Emotionally Abused:   Marland Kitchen Physically Abused:   . Sexually Abused:     Family History  Problem Relation Age of Onset  . Gastric cancer Mother   . COPD Father     Review of Systems:  As stated in the HPI and otherwise negative.   BP 110/72   Pulse 93   Ht 5\' 8"  (1.727 m)   Wt 205 lb 12.8 oz (93.4 kg)   SpO2 98%   BMI 31.29 kg/m   Physical Examination: General: Well developed, well nourished, NAD  HEENT: OP clear, mucus membranes moist  SKIN: warm, dry. No rashes. Neuro: No focal deficits  Musculoskeletal: Muscle strength 5/5 all ext  Psychiatric: Mood and affect normal  Neck: No JVD, no carotid bruits, no thyromegaly, no lymphadenopathy.  Lungs:Clear bilaterally, no wheezes, rhonci, crackles Cardiovascular: Regular rate and rhythm. No murmurs, gallops or rubs. Abdomen:Soft. Bowel sounds present. Non-tender.  Extremities: No lower extremity edema. Pulses are 2 + in the bilateral DP/PT.  EKG:  EKG is ordered today. The ekg ordered today demonstrates sinus  Recent Labs: No results found for requested labs within last 8760 hours.   Lipid Panel No results found for: CHOL, TRIG, HDL, CHOLHDL, VLDL, LDLCALC,  LDLDIRECT   Wt Readings from Last 3 Encounters:  07/29/19 205 lb 12.8 oz (93.4 kg)  05/21/19 212 lb 9.6 oz (96.4 kg)  02/19/19 214 lb (97.1 kg)      Assessment and Plan:   1. Aortic atherosclerosis/LAD plaque on CT: No symptoms to suggest angina. EKG is normal. Exam is normal. Will arrange an echo  to assess LV systolic function. Start ASA 81 mg daily. Start Crestor 5 mg daily. Lipids and LFTs in 12 weeks.   Current medicines are reviewed at length with the patient today.  The patient does not have concerns regarding medicines.  The following changes have been made:  no change  Labs/ tests ordered today include:   Orders Placed This Encounter  Procedures  . Hepatic function panel  . Lipid panel  . EKG 12-Lead  . ECHOCARDIOGRAM COMPLETE     Disposition:   FU with me in one year.    Signed, Lauree Chandler, MD 07/29/2019 10:52 AM    [facility] [address], [place], [place]  [address] Phone: [phone]; Fax: [phone]

## 2019-08-18 ENCOUNTER — Encounter (HOSPITAL_COMMUNITY): Payer: Self-pay

## 2019-08-18 ENCOUNTER — Other Ambulatory Visit (HOSPITAL_COMMUNITY): Payer: Medicare Other

## 2019-08-18 NOTE — Progress Notes (Unsigned)
Verified appointment "no show" status with Durenda Age at [facility].

## 2019-09-01 ENCOUNTER — Ambulatory Visit (HOSPITAL_COMMUNITY): Payer: Medicare Other | Attending: Cardiovascular Disease

## 2019-09-01 ENCOUNTER — Other Ambulatory Visit: Payer: Self-pay

## 2019-09-01 DIAGNOSIS — I700 Atherosclerosis of aorta: Secondary | ICD-10-CM | POA: Diagnosis not present

## 2019-09-01 DIAGNOSIS — I2510 Atherosclerotic heart disease of native coronary artery without angina pectoris: Secondary | ICD-10-CM | POA: Insufficient documentation

## 2019-09-01 LAB — ECHOCARDIOGRAM COMPLETE
Area-P 1/2: 3.91 cm2
S' Lateral: 3.3 cm

## 2019-09-20 NOTE — Patient Instructions (Addendum)
Nice seeing you today [facility]  Recommendations; Continue Trelegy one puff daily Due for Covid Booster in Jan/Feb Recommend getting flu vaccine in mid-late September   Orders: Re-qualify for oxygen  Follow-up: 4-6 months with Dr. Melvyn Novas   COPD and Physical Activity Chronic obstructive pulmonary disease (COPD) is a long-term (chronic) condition that affects the lungs. COPD is a general term that can be used to describe many different lung problems that cause lung swelling (inflammation) and limit airflow, including chronic bronchitis and emphysema. The main symptom of COPD is shortness of breath, which makes it harder to do even simple tasks. This can also make it harder to exercise and be active. Talk with your health care provider about treatments to help you breathe better and actions you can take to prevent breathing problems during physical activity. What are the benefits of exercising with COPD? Exercising regularly is an important part of a healthy lifestyle. You can still exercise and do physical activities even though you have COPD. Exercise and physical activity improve your shortness of breath by increasing blood flow (circulation). This causes your heart to pump more oxygen through your body. Moderate exercise can improve your:  Oxygen use.  Energy level.  Shortness of breath.  Strength in your breathing muscles.  Heart health.  Sleep.  Self-esteem and feelings of self-worth.  Depression, stress, and anxiety levels. Exercise can benefit everyone with COPD. The severity of your disease may affect how hard you can exercise, especially at first, but everyone can benefit. Talk with your health care provider about how much exercise is safe for you, and which activities and exercises are safe for you. What actions can I take to prevent breathing problems during physical activity?  Sign up for a pulmonary rehabilitation program. This type of program may include: ? Education  about lung diseases. ? Exercise classes that teach you how to exercise and be more active while improving your breathing. This usually involves:  Exercise using your lower extremities, such as a stationary bicycle.  About 30 minutes of exercise, 2 to 5 times per week, for 6 to 12 weeks  Strength training, such as push ups or leg lifts. ? Nutrition education. ? Group classes in which you can talk with others who also have COPD and learn ways to manage stress.  If you use an oxygen tank, you should use it while you exercise. Work with your health care provider to adjust your oxygen for your physical activity. Your resting flow rate is different from your flow rate during physical activity.  While you are exercising: ? Take slow breaths. ? Pace yourself and do not try to go too fast. ? Purse your lips while breathing out. Pursing your lips is similar to a kissing or whistling position. ? If doing exercise that uses a quick burst of effort, such as weight lifting:  Breathe in before starting the exercise.  Breathe out during the hardest part of the exercise (such as raising the weights). Where to find support You can find support for exercising with COPD from:  Your health care provider.  A pulmonary rehabilitation program.  Your local health department or community health programs.  Support groups, online or in-person. Your health care provider may be able to recommend support groups. Where to find more information You can find more information about exercising with COPD from:  American Lung Association: ClassInsider.se.  COPD Foundation: https://www.rivera.net/. Contact a health care provider if:  Your symptoms get worse.  You have chest  pain.  You have nausea.  You have a fever.  You have trouble talking or catching your breath.  You want to start a new exercise program or a new activity. Summary  COPD is a general term that can be used to describe many different lung problems  that cause lung swelling (inflammation) and limit airflow. This includes chronic bronchitis and emphysema.  Exercise and physical activity improve your shortness of breath by increasing blood flow (circulation). This causes your heart to provide more oxygen to your body.  Contact your health care provider before starting any exercise program or new activity. Ask your health care provider what exercises and activities are safe for you. This information is not intended to replace advice given to you by your health care provider. Make sure you discuss any questions you have with your health care provider. Document Revised: 05/07/2018 Document Reviewed: 02/07/2017 Elsevier Patient Education  2020 [employer].

## 2019-09-20 NOTE — Progress Notes (Signed)
@Patient  ID: Gilbert Maes., male    DOB: 1950-12-08, 69 y.o.   MRN: [id number]  Chief Complaint  Patient presents with  . Follow-up    Pt states he has been doing good since last visit and denies any current complaints. Pt wears 3L pulse with exertion and wears 2L at home with concentrator. DME: [employer]    Referring provider: Maylon Peppers, MD  HPI: 69 year old male, former smoker quit in 2013 (67 pack-year history).  Past medical history significant for COPD Gold 4, chronic respiratory failure with hypoxemia (O2 dependent), Mycobacterium tuberculosis infection, hypertension, obesity. Patient of Dr. Melvyn Novas. Changed back to Trelegy d/t cost.   Previous [facility] pulmonary encounters: 02/19/2019 Patient presents today for 2-week follow-up office visit. He has seen no noticeable improvement in breathing/COPD symptoms since starting back on Breztri. Medication is also not covered by his insurance and will cost him upwards for $700 dollars. He would like to return back to Trelegy. Uses his Albuterol rescue inhaler 1-2 times a day. No reports of significant cough or mucus production. He usually does not require oxygen during the day when sitting at rest, uses when out of the house. Continues to wear 2L oxygen at night. He has been to pulmonary rehab in the past but it has been awhile since he last went. He would like to resume exercise routine but stopped d/t covid pandemic.  05/21/2019 Patient presents today for 3 month follow-up. He has not had any recent COPD exacerbation or hospitalizations since his last visit. States that he has his good days and bad when it comes to his breathing. He has attended pulmonary rehab, interested in returning but would like to go to [facility]. He saw no difference in his breathing compared to Trelegy which is more affordable. He uses albuterol hfa one puff twice a day with improvement . He remains on 2L oxygen continuous. He wears oxygen at night. He had an ONO  back in December 2020 which showed no desaturation. States that the pollen has seemed to worsen his COPD symptoms. He has some post nasal drip/nasal congestion. He is not currently taking any over the counter medication. Denies chest tightness, wheezing and cough. He has had both covid vaccines.   09/21/2019- Interim hx Patient presents today for 4 month follow-up. During last visit, patient was re-referred to pulmonary rehab at [facility]. He needs to re-qualify for oxygen today. He has been doing well lately. No acute symptoms. No recent exacerbations or hospitalization. He needed to use his nebulizer once last week d/t heat/humdity. He continues Trelegy 100 one puff daily. He is in donut hole. He was contacted by pulmonary rehab with [place] but he had wanted to go to [facility]. He had to quit going to [facility] d/t covid. He recently purchased treadmill and plans to start walking at home. He was recently started on Crestor by cardiologist.    Allergies  Allergen Reactions  . Azithromycin     Wife noted memory loss when patient was on this chronically    Immunization History  Administered Date(s) Administered  . Fluad Quad(high Dose 65+) 11/12/2018  . Influenza Split 01/10/2012  . Influenza, High Dose Seasonal PF 01/10/2012, 10/08/2012, 10/23/2013, 10/19/2014, 11/28/2015, 10/23/2016, 02/11/2018  . Influenza,inj,Quad PF,6+ Mos 10/08/2012, 10/23/2013, 10/19/2014, 11/28/2015  . Influenza-Unspecified 11/11/2015, 10/23/2016  . PFIZER SARS-COV-2 Vaccination 05/08/2019  . Pneumococcal Conjugate-13 01/09/2012, 12/21/2013  . Pneumococcal Polysaccharide-23 01/09/2012  . Zoster 05/27/2015    Past Medical History:  Diagnosis Date  .  Abdominal bloating   . Atherosclerosis of aorta ([place])   . Chronic respiratory failure with hypoxia (HCC) 04/10/2013   Exertional hypoxemia 04/10/2013  - ONO 2 lpm 01/28/2019 >>> sats ok , no desat      . COPD (chronic obstructive pulmonary disease)  ([place])   . COPD GOLD IV/ 02 dep     Quit smoking  2013 at wt = 170  Spiro 10/08/12: FeV1 29% FeV1/FVC 41% Alpha one antitrypsin 04/10/2013   MM  Level 145 Spiro 04/10/2013>> FeV1 26% FVC 56% FeV1/FVC 53% - 11/12/2018  After extensive coaching inhaler device,  effectiveness =    90% hfa so try Breztri 2 bid > not on plan so resumed trelegy and flared first week in November 2020 > rx 12/24/2018 with doxy/pred x 6d  - 01/21/2019 changed b  . Epigastric abdominal pain   . Flank pain 03/24/2015  . Hypertension   . Morbid obesity due to excess calories ([place])  03/20/2016   Wt 170 at smoking cessation   . Mycobacterium tuberculosis infection 05/01/2016   03/2016 AFB sputum culture positive  . Mycobacterium tuberculosis infection   . Osteoarthritis of multiple joints   . Osteoarthrosis   . Oxygen deficiency   . Tobacco abuse, in remission 01/04/2012   No smoking since December 2013   . Varicose veins     Tobacco History: Social History   Tobacco Use  Smoking Status Former Smoker  . Packs/day: 1.50  . Years: 45.00  . Pack years: 67.50  . Types: Cigarettes  . Quit date: 12/31/2011  . Years since quitting: 7.7  Smokeless Tobacco Never Used   Counseling given: Not Answered   Outpatient Medications Prior to Visit  Medication Sig Dispense Refill  . albuterol (PROVENTIL) (2.5 MG/3ML) 0.083% nebulizer solution Take 3 mLs (2.5 mg total) by nebulization every 6 (six) hours as needed for wheezing or shortness of breath. 360 mL 11  . albuterol (VENTOLIN HFA) 108 (90 Base) MCG/ACT inhaler INHALE 1 PUFF INTO THE LUNGS EVERY 6 (SIX) HOURS AS NEEDED FOR WHEEZING OR SHORTNESS OF BREATH. 8 g 4  . aspirin EC 81 MG tablet Take 1 tablet (81 mg total) by mouth daily. Swallow whole. 90 tablet 3  . busPIRone (BUSPAR) 15 MG tablet Take 0.5 tablets (7.5 mg total) by mouth 2 (two) times daily. 90 tablet 3  . fluticasone (FLONASE) 50 MCG/ACT nasal spray Place 1 spray into both nostrils daily. 16 g 2  . loratadine  (CLARITIN) 10 MG tablet TAKE 1 TABLET BY MOUTH EVERY DAY 90 tablet 1  . OXYGEN Oxygen 2.5 lpm with sleep and as needed during the day, 3lpm with exertion    . Respiratory Therapy Supplies (FLUTTER) DEVI Use as directed 1 each 0  . rosuvastatin (CRESTOR) 5 MG tablet Take 1 tablet (5 mg total) by mouth daily. 90 tablet 3  . TRELEGY ELLIPTA 100-62.5-25 MCG/INH AEPB TAKE 1 PUFF BY MOUTH EVERY DAY 60 each 5   No facility-administered medications prior to visit.    Review of Systems  Review of Systems  Constitutional: Negative.   Respiratory: Negative for cough, chest tightness, shortness of breath and wheezing.    Physical Exam  BP 136/78 (BP Location: Right Arm, Cuff Size: Normal)   Pulse 83   Ht 5\' 8"  (1.727 m)   Wt 206 lb 6.4 oz (93.6 kg)   SpO2 97%   BMI 31.38 kg/m  Physical Exam Constitutional:      General: He is not in acute  distress.    Appearance: Normal appearance. He is not ill-appearing.  HENT:     Mouth/Throat:     Mouth: Mucous membranes are moist.     Pharynx: Oropharynx is clear.  Cardiovascular:     Rate and Rhythm: Normal rate and regular rhythm.     Comments: Trace LE edema Pulmonary:     Effort: Pulmonary effort is normal.     Breath sounds: Normal breath sounds. No wheezing or rhonchi.  Musculoskeletal:        General: Normal range of motion.     Cervical back: Normal range of motion and neck supple.  Skin:    General: Skin is warm and dry.  Neurological:     General: No focal deficit present.     Mental Status: He is alert and oriented to person, place, and time. Mental status is at baseline.  Psychiatric:        Mood and Affect: Mood normal.        Behavior: Behavior normal.        Thought Content: Thought content normal.        Judgment: Judgment normal.      Lab Results:  CBC    Component Value Date/Time   WBC 6.7 10/23/2016 0954   RBC 5.27 10/23/2016 0954   HGB 15.7 10/23/2016 0954   HCT 46.8 10/23/2016 0954   PLT 268.0 10/23/2016  0954   MCV 88.8 10/23/2016 0954   MCH 31.6 04/28/2010 1621   MCHC 33.5 10/23/2016 0954   RDW 14.2 10/23/2016 0954   LYMPHSABS 1.1 10/23/2016 0954   MONOABS 0.6 10/23/2016 0954   EOSABS 0.1 10/23/2016 0954   BASOSABS 0.0 10/23/2016 0954    BMET    Component Value Date/Time   NA 140 04/28/2010 1644   K 5.2 (H) 04/28/2010 1644   CL 105 04/28/2010 1644   GLUCOSE 80 04/28/2010 1644   BUN 17 04/28/2010 1644   CREATININE 1.0 04/28/2010 1644    BNP No results found for: BNP  ProBNP No results found for: PROBNP  Imaging: ECHOCARDIOGRAM COMPLETE  Result Date: 09/01/2019    ECHOCARDIOGRAM REPORT   Patient Name:   Esten Dollar. Date of Exam: 09/01/2019 Medical Rec #:  [id number]       Height:       68.0 in Accession #:    [id number]      Weight:       205.8 lb Date of Birth:  06-05-1950       BSA:          2.069 m Patient Age:    19 years        BP:           110/72 mmHg Patient Gender: M               HR:           89 bpm. Exam Location:  [place] Procedure: 2D Echo, 3D Echo, Cardiac Doppler, Color Doppler and Strain Analysis Indications:    I25.1 CAD  History:        Patient has no prior history of Echocardiogram examinations.                 COPD; Risk Factors:Hypertension, Former Smoker and Morbid                 obesity.  Sonographer:    Jessee Avers, RDCS Referring Phys: [place]  1. Left ventricular ejection  fraction, by estimation, is 60 to 65%. The left ventricle has normal function. The left ventricle has no regional wall motion abnormalities. Left ventricular diastolic parameters were normal.  2. Right ventricular systolic function is normal. The right ventricular size is normal.  3. The mitral valve is normal in structure. No evidence of mitral valve regurgitation. No evidence of mitral stenosis.  4. The aortic valve is tricuspid. Aortic valve regurgitation is not visualized. No aortic stenosis is present.  5. The inferior vena cava is normal in size  with greater than 50% respiratory variability, suggesting right atrial pressure of 3 mmHg. FINDINGS  Left Ventricle: Left ventricular ejection fraction, by estimation, is 60 to 65%. The left ventricle has normal function. The left ventricle has no regional wall motion abnormalities. The left ventricular internal cavity size was normal in size. There is  no left ventricular hypertrophy. Left ventricular diastolic parameters were normal. Right Ventricle: The right ventricular size is normal.Right ventricular systolic function is normal. Left Atrium: Left atrial size was normal in size. Right Atrium: Right atrial size was normal in size. Pericardium: There is no evidence of pericardial effusion. Mitral Valve: The mitral valve is normal in structure. Normal mobility of the mitral valve leaflets. No evidence of mitral valve regurgitation. No evidence of mitral valve stenosis. Tricuspid Valve: The tricuspid valve is normal in structure. Tricuspid valve regurgitation is trivial. No evidence of tricuspid stenosis. Aortic Valve: The aortic valve is tricuspid. Aortic valve regurgitation is not visualized. No aortic stenosis is present. Pulmonic Valve: The pulmonic valve was not well visualized. Pulmonic valve regurgitation is not visualized. No evidence of pulmonic stenosis. Aorta: The aortic root is normal in size and structure. Venous: The inferior vena cava is normal in size with greater than 50% respiratory variability, suggesting right atrial pressure of 3 mmHg. IAS/Shunts: The interatrial septum appears to be lipomatous. No atrial level shunt detected by color flow Doppler.  LEFT VENTRICLE PLAX 2D LVIDd:         4.80 cm  Diastology LVIDs:         3.30 cm  LV e' lateral:   10.50 cm/s LV PW:         0.95 cm  LV E/e' lateral: 8.6 LV IVS:        0.85 cm  LV e' medial:    8.76 cm/s LVOT diam:     2.00 cm  LV E/e' medial:  10.3 LV SV:         63 LV SV Index:   30       2D Longitudinal Strain LVOT Area:     3.14 cm 2D Strain  GLS (A2C):   -17.7 %                         2D Strain GLS (A3C):   -21.1 %                         2D Strain GLS (A4C):   -20.4 %                         2D Strain GLS Avg:     -19.7 %                          3D Volume EF:  3D EF:        55 %                         LV EDV:       129 ml                         LV ESV:       58 ml                         LV SV:        70 ml RIGHT VENTRICLE RV Basal diam:  3.20 cm RV S prime:     11.00 cm/s TAPSE (M-mode): 2.3 cm LEFT ATRIUM             Index       RIGHT ATRIUM           Index LA diam:        3.90 cm 1.89 cm/m  RA Pressure: 3.00 mmHg LA Vol (A2C):   28.2 ml 13.63 ml/m RA Area:     8.59 cm LA Vol (A4C):   25.5 ml 12.33 ml/m RA Volume:   16.60 ml  8.02 ml/m LA Biplane Vol: 27.6 ml 13.34 ml/m  AORTIC VALVE LVOT Vmax:   111.00 cm/s LVOT Vmean:  63.000 cm/s LVOT VTI:    0.199 m  AORTA Ao Root diam: 3.30 cm Ao Asc diam:  3.70 cm MITRAL VALVE               TRICUSPID VALVE                            Estimated RAP:  3.00 mmHg  MV E velocity: 90.20 cm/s  SHUNTS MV A velocity: 90.20 cm/s  Systemic VTI:  0.20 m MV E/A ratio:  1.00        Systemic Diam: 2.00 cm Kirk Ruths MD Electronically signed by Kirk Ruths MD Signature Date/Time: 09/01/2019/4:21:10 PM    Final      Assessment & Plan:   COPD GOLD IV/ 02 dep  - Stable interval. No acute complaints, recent exacerbations or hospitalizations. - Continue Trelegy 100 one puff daily (RX card for 30 day supply given to patient) - Recommend Covid Booster in Jan/Feb and flu vaccine in mid-late September  - Follow-up: 4-6 months with Dr. Melvyn Novas   Chronic respiratory failure with hypoxia [facility]) - Patient re-qualified for home O2 today - O2 87% RA after 1-2 laps, requiring 3L POC   Martyn Ehrich, NP 09/21/2019

## 2019-09-21 ENCOUNTER — Ambulatory Visit (INDEPENDENT_AMBULATORY_CARE_PROVIDER_SITE_OTHER): Payer: Medicare Other | Admitting: Primary Care

## 2019-09-21 ENCOUNTER — Ambulatory Visit: Payer: Medicare Other | Admitting: Internal Medicine

## 2019-09-21 ENCOUNTER — Encounter: Payer: Self-pay | Admitting: Primary Care

## 2019-09-21 DIAGNOSIS — I2510 Atherosclerotic heart disease of native coronary artery without angina pectoris: Secondary | ICD-10-CM

## 2019-09-21 DIAGNOSIS — J449 Chronic obstructive pulmonary disease, unspecified: Secondary | ICD-10-CM

## 2019-09-21 DIAGNOSIS — J9611 Chronic respiratory failure with hypoxia: Secondary | ICD-10-CM | POA: Diagnosis not present

## 2019-09-21 MED ORDER — TRELEGY ELLIPTA 100-62.5-25 MCG/INH IN AEPB: INHALATION_SPRAY | RESPIRATORY_TRACT | 0 refills | Status: DC

## 2019-09-21 NOTE — Assessment & Plan Note (Signed)
-   Stable interval. No acute complaints, recent exacerbations or hospitalizations. - Continue Trelegy 100 one puff daily (RX card for 30 day supply given to patient) - Recommend Covid Booster in Jan/Feb and flu vaccine in mid-late September  - Follow-up: 4-6 months with Dr. Melvyn Novas

## 2019-09-21 NOTE — Assessment & Plan Note (Signed)
-   Patient re-qualified for home O2 today - O2 87% RA after 1-2 laps, requiring 3L POC

## 2019-10-21 ENCOUNTER — Other Ambulatory Visit: Payer: Self-pay

## 2019-10-21 ENCOUNTER — Other Ambulatory Visit: Payer: Medicare Other

## 2019-10-21 DIAGNOSIS — I700 Atherosclerosis of aorta: Secondary | ICD-10-CM

## 2019-10-21 DIAGNOSIS — I2510 Atherosclerotic heart disease of native coronary artery without angina pectoris: Secondary | ICD-10-CM | POA: Diagnosis not present

## 2019-10-21 LAB — HEPATIC FUNCTION PANEL
ALT: 10 IU/L (ref 0–44)
AST: 14 IU/L (ref 0–40)
Albumin: 4.6 g/dL (ref 3.8–4.8)
Alkaline Phosphatase: 94 IU/L (ref 44–121)
Bilirubin Total: 0.4 mg/dL (ref 0.0–1.2)
Bilirubin, Direct: 0.15 mg/dL (ref 0.00–0.40)
Total Protein: 6.8 g/dL (ref 6.0–8.5)

## 2019-10-21 LAB — LIPID PANEL
Chol/HDL Ratio: 2.3 ratio (ref 0.0–5.0)
Cholesterol, Total: 110 mg/dL (ref 100–199)
HDL: 48 mg/dL (ref >39)
LDL Chol Calc (NIH): 47 mg/dL (ref 0–99)
Triglycerides: 73 mg/dL (ref 0–149)
VLDL Cholesterol Cal: 15 mg/dL (ref 5–40)

## 2019-10-29 DIAGNOSIS — E7800 Pure hypercholesterolemia, unspecified: Secondary | ICD-10-CM | POA: Diagnosis not present

## 2019-10-29 DIAGNOSIS — F4323 Adjustment disorder with mixed anxiety and depressed mood: Secondary | ICD-10-CM | POA: Diagnosis not present

## 2019-10-29 DIAGNOSIS — I10 Essential (primary) hypertension: Secondary | ICD-10-CM | POA: Diagnosis not present

## 2019-10-29 DIAGNOSIS — R972 Elevated prostate specific antigen [PSA]: Secondary | ICD-10-CM | POA: Diagnosis not present

## 2019-10-29 DIAGNOSIS — Z79899 Other long term (current) drug therapy: Secondary | ICD-10-CM | POA: Diagnosis not present

## 2019-11-05 DIAGNOSIS — N400 Enlarged prostate without lower urinary tract symptoms: Secondary | ICD-10-CM | POA: Diagnosis not present

## 2019-11-05 DIAGNOSIS — N529 Male erectile dysfunction, unspecified: Secondary | ICD-10-CM | POA: Diagnosis not present

## 2019-11-09 ENCOUNTER — Telehealth: Payer: Self-pay | Admitting: Internal Medicine

## 2019-11-09 MED ORDER — DOXYCYCLINE HYCLATE 100 MG PO TABS: 100.0000 mg | ORAL_TABLET | Freq: Two times a day (BID) | ORAL | 0 refills | Status: DC

## 2019-11-09 MED ORDER — PREDNISONE 10 MG PO TABS: ORAL_TABLET | ORAL | 0 refills | Status: DC

## 2019-11-09 NOTE — Telephone Encounter (Signed)
Prednisone 10 mg take  4 each am x 2 days,   2 each am x 2 days,  1 each am x 2 days and stop zpak 

## 2019-11-09 NOTE — Telephone Encounter (Signed)
Called and spoke to patient.  Patient stated that he has developed chest congestion, increased sob and prod cough with light green mucus x6d. Denied fever, chills or sweats.  Using mucinex 600mg  BID, albuterol neb solution twice in the past week and trelegy daily with no relief in sx.  Patient has had both covid vaccines.  Patient is requesting Rx for prednisone and zpak.   Dr. Melvyn Novas, please advise. Thanks

## 2019-11-09 NOTE — Telephone Encounter (Signed)
Spoke to patient and relayed below recommendations.  Patient is allergic to Azithromycin. Rx for prednisone has been sent to preferred pharmacy.    Dr. Melvyn Novas please advise on abx. Thanks

## 2019-11-09 NOTE — Telephone Encounter (Signed)
ATC patient unable to reach LM to call back office (x1)  

## 2019-11-09 NOTE — Telephone Encounter (Signed)
Let him know he's listed as allergic to zpak so I sent in doxy 100 mg bid take before eating with glass of water

## 2019-11-09 NOTE — Telephone Encounter (Signed)
Pt returning a phone call. Pt can be reached at [phone].

## 2019-11-10 NOTE — Telephone Encounter (Signed)
Spoke with the pt and notified that rx for doxy sent in place of zpack  Nothing further needed

## 2019-11-10 NOTE — Telephone Encounter (Signed)
LMTCB

## 2019-11-10 NOTE — Telephone Encounter (Signed)
Patient is returning phone call. Patient phone number is [phone].

## 2019-11-17 ENCOUNTER — Other Ambulatory Visit: Payer: Self-pay

## 2019-11-17 ENCOUNTER — Ambulatory Visit (INDEPENDENT_AMBULATORY_CARE_PROVIDER_SITE_OTHER): Payer: Medicare Other

## 2019-11-17 ENCOUNTER — Telehealth: Payer: Self-pay | Admitting: Internal Medicine

## 2019-11-17 ENCOUNTER — Encounter: Payer: Self-pay | Admitting: Primary Care

## 2019-11-17 ENCOUNTER — Ambulatory Visit (INDEPENDENT_AMBULATORY_CARE_PROVIDER_SITE_OTHER): Payer: Medicare Other | Admitting: Primary Care

## 2019-11-17 VITALS — BP 128/78 | HR 98 | Temp 97.3°F | Ht 68.0 in | Wt 208.8 lb

## 2019-11-17 DIAGNOSIS — J449 Chronic obstructive pulmonary disease, unspecified: Secondary | ICD-10-CM | POA: Diagnosis not present

## 2019-11-17 DIAGNOSIS — J441 Chronic obstructive pulmonary disease with (acute) exacerbation: Secondary | ICD-10-CM

## 2019-11-17 DIAGNOSIS — R059 Cough, unspecified: Secondary | ICD-10-CM | POA: Diagnosis not present

## 2019-11-17 MED ORDER — PREDNISONE 10 MG PO TABS: ORAL_TABLET | ORAL | 0 refills | Status: DC

## 2019-11-17 MED ORDER — DOXYCYCLINE HYCLATE 100 MG PO TABS: 100.0000 mg | ORAL_TABLET | Freq: Two times a day (BID) | ORAL | 0 refills | Status: DC

## 2019-11-17 NOTE — Assessment & Plan Note (Signed)
Treated for AECOPD on 11/09/19, he is some better but continues to have chest/head congestion, productive cough with green mucus and wheezing. He had expiratory wheezing t/o lung fields on exam. Recommend checking CXR today. Plan extend Doxycycline additional 3 days and prednisone taper. Continue Trelegy 100 one puff daily; Albuterol nebulier every 6 hours as needed for breakthrough shortness of breath/wheezing. He is to take mucinex 600-1200mg  twice daily with full glass of water and use flutter valve three times a day. Follow-up in 7-10 days or symptoms worsen

## 2019-11-17 NOTE — Telephone Encounter (Signed)
Spoke with pt. States that he is still having issues with chest congestion after last week. He called in and was given antibiotic. Pt has since finished that and is still having issues. He is not able to cough any of the congestion up at this time. Pt would like Dr. Gustavus Bryant recommendations.  Dr. Melvyn Novas - please advise. Thanks.

## 2019-11-17 NOTE — Telephone Encounter (Signed)
Spoke with pt. He is aware of MW's response. OV has been scheduled for today at 1630 with Beth. Nothing further was needed.

## 2019-11-17 NOTE — Patient Instructions (Addendum)
Orders: - CXR today re: COPD exacerbation  Recommendations: - Continue Trelegy 1 puff daily (rinse mouth after use) - Use Albuterol nebulizer every 6 hours as needed for breakthrough shortness of breath/wheezing - Continue mucinex 600-1200mg  twice daily with full glass of water - Use flutter valve three times a day   Rx: - Extending doxycycline course additional 3 days and prednisone taper   Follow-up: - If not better in 7-10 days or symptoms worsen

## 2019-11-17 NOTE — Progress Notes (Signed)
@Patient  ID: Gilbert Jefferson., male    DOB: 10-Jan-1951, 69 y.o.   MRN: [id number]  Chief Complaint  Patient presents with  . Follow-up    Pt states he is still having a lot of chest congestion as well as head congestion after recent round of abx and steroids. Pt does have an occ cough with light green phlegm and also has some wheezing.    Referring provider: Maylon Peppers, MD  HPI: 69 year old male, former smoker quit in 2013 (67 pack-year history).  Past medical history significant for COPD Gold 4, chronic respiratory failure with hypoxemia (O2 dependent), Mycobacterium tuberculosis infection, hypertension, obesity. Patient of Dr. Melvyn Novas. Changed back to Trelegy d/t cost.   Previous [facility] pulmonary encounters: 02/19/2019 Patient presents today for 2-week follow-up office visit. He has seen no noticeable improvement in breathing/COPD symptoms since starting back on Breztri. Medication is also not covered by his insurance and will cost him upwards for $700 dollars. He would like to return back to Trelegy. Uses his Albuterol rescue inhaler 1-2 times a day. No reports of significant cough or mucus production. He usually does not require oxygen during the day when sitting at rest, uses when out of the house. Continues to wear 2L oxygen at night. He has been to pulmonary rehab in the past but it has been awhile since he last went. He would like to resume exercise routine but stopped d/t covid pandemic.  05/21/2019 Patient presents today for 3 month follow-up. He has not had any recent COPD exacerbation or hospitalizations since his last visit. States that he has his good days and bad when it comes to his breathing. He has attended pulmonary rehab, interested in returning but would like to go to [facility]. He saw no difference in his breathing compared to Trelegy which is more affordable. He uses albuterol hfa one puff twice a day with improvement . He remains on 2L oxygen continuous. He wears  oxygen at night. He had an ONO back in December 2020 which showed no desaturation. States that the pollen has seemed to worsen his COPD symptoms. He has some post nasal drip/nasal congestion. He is not currently taking any over the counter medication. Denies chest tightness, wheezing and cough. He has had both covid vaccines.   09/21/2019 Patient presents today for 4 month follow-up. During last visit, patient was re-referred to pulmonary rehab at [facility]. He needs to re-qualify for oxygen today. He has been doing well lately. No acute symptoms. No recent exacerbations or hospitalization. He needed to use his nebulizer once last week d/t heat/humdity. He continues Trelegy 100 one puff daily. He is in donut hole. He was contacted by pulmonary rehab with [facility] but he had wanted to go to [facility]. He had to quit going to [facility] d/t covid. He recently purchased treadmill and plans to start walking at home. He was recently started on Crestor by cardiologist.   11/17/2019- Interim hx Patient presents today for acute visit, increased chest congestion. He called our office on 11/09/19 with reports of chest congestion, productive cough and shortness of breath. He was sent in prescription for Doxycycline and prednisone taper. He is doing some better but not back to baseline. He finished doxycline yesterday. Continues to have congestion in chest and head. Associated chest tightness and wheezing. He is getting up light green phlegm. He is taking mucinex twice daily as well as flonase nasal spray.   Pulmonary function test: 02/25/17- FVC 2.15 (52%), FEV1  0.88 (28%), ratio 41 Severe obstructive airway disease with severe diffusion capacity consistent with COPD/emphysema    Allergies  Allergen Reactions  . Azithromycin     Wife noted memory loss when patient was on this chronically    Immunization History  Administered Date(s) Administered  . Fluad Quad(high Dose 65+) 11/12/2018  .  Influenza Split 01/10/2012  . Influenza, High Dose Seasonal PF 01/10/2012, 10/08/2012, 10/23/2013, 10/19/2014, 11/28/2015, 10/23/2016, 02/11/2018  . Influenza,inj,Quad PF,6+ Mos 10/08/2012, 10/23/2013, 10/19/2014, 11/28/2015  . Influenza-Unspecified 11/11/2015, 10/23/2016  . PFIZER SARS-COV-2 Vaccination 05/08/2019  . Pneumococcal Conjugate-13 01/09/2012, 12/21/2013  . Pneumococcal Polysaccharide-23 01/09/2012  . Zoster 05/27/2015    Past Medical History:  Diagnosis Date  . Abdominal bloating   . Atherosclerosis of aorta ([place])   . Chronic respiratory failure with hypoxia (HCC) 04/10/2013   Exertional hypoxemia 04/10/2013  - ONO 2 lpm 01/28/2019 >>> sats ok , no desat      . COPD (chronic obstructive pulmonary disease) ([place])   . COPD GOLD IV/ 02 dep     Quit smoking  2013 at wt = 170  Spiro 10/08/12: FeV1 29% FeV1/FVC 41% Alpha one antitrypsin 04/10/2013   MM  Level 145 Spiro 04/10/2013>> FeV1 26% FVC 56% FeV1/FVC 53% - 11/12/2018  After extensive coaching inhaler device,  effectiveness =    90% hfa so try Breztri 2 bid > not on plan so resumed trelegy and flared first week in November 2020 > rx 12/24/2018 with doxy/pred x 6d  - 01/21/2019 changed b  . Epigastric abdominal pain   . Flank pain 03/24/2015  . Hypertension   . Morbid obesity due to excess calories ([place])  03/20/2016   Wt 170 at smoking cessation   . Mycobacterium tuberculosis infection 05/01/2016   03/2016 AFB sputum culture positive  . Mycobacterium tuberculosis infection   . Osteoarthritis of multiple joints   . Osteoarthrosis   . Oxygen deficiency   . Tobacco abuse, in remission 01/04/2012   No smoking since December 2013   . Varicose veins     Tobacco History: Social History   Tobacco Use  Smoking Status Former Smoker  . Packs/day: 1.50  . Years: 45.00  . Pack years: 67.50  . Types: Cigarettes  . Quit date: 12/31/2011  . Years since quitting: 7.8  Smokeless Tobacco Never Used   Counseling given: Not  Answered   Outpatient Medications Prior to Visit  Medication Sig Dispense Refill  . albuterol (PROVENTIL) (2.5 MG/3ML) 0.083% nebulizer solution Take 3 mLs (2.5 mg total) by nebulization every 6 (six) hours as needed for wheezing or shortness of breath. 360 mL 11  . albuterol (VENTOLIN HFA) 108 (90 Base) MCG/ACT inhaler INHALE 1 PUFF INTO THE LUNGS EVERY 6 (SIX) HOURS AS NEEDED FOR WHEEZING OR SHORTNESS OF BREATH. 8 g 4  . aspirin EC 81 MG tablet Take 1 tablet (81 mg total) by mouth daily. Swallow whole. 90 tablet 3  . busPIRone (BUSPAR) 15 MG tablet Take 0.5 tablets (7.5 mg total) by mouth 2 (two) times daily. 90 tablet 3  . fluticasone (FLONASE) 50 MCG/ACT nasal spray Place 1 spray into both nostrils daily. 16 g 2  . Fluticasone-Umeclidin-Vilant (TRELEGY ELLIPTA) 100-62.5-25 MCG/INH AEPB TAKE 1 PUFF BY MOUTH EVERY DAY 28 each 0  . loratadine (CLARITIN) 10 MG tablet TAKE 1 TABLET BY MOUTH EVERY DAY 90 tablet 1  . OXYGEN Oxygen 2.5 lpm with sleep and as needed during the day, 3lpm with exertion    . Respiratory Therapy  Supplies (FLUTTER) DEVI Use as directed 1 each 0  . rosuvastatin (CRESTOR) 5 MG tablet Take 1 tablet (5 mg total) by mouth daily. 90 tablet 3  . doxycycline (VIBRA-TABS) 100 MG tablet Take 1 tablet (100 mg total) by mouth 2 (two) times daily. 14 tablet 0  . predniSONE (DELTASONE) 10 MG tablet 4tab x2d, 2tab x2d, 1tab x 2d then stop 14 tablet 0   No facility-administered medications prior to visit.   Review of Systems  Review of Systems  Constitutional: Negative.   HENT: Positive for congestion.   Respiratory: Positive for cough, chest tightness and wheezing.    Physical Exam  BP 128/78 (BP Location: Right Arm, Cuff Size: Normal)   Pulse 98   Temp (!) 97.3 F (36.3 C) (Other (Comment)) Comment (Src): wrist  Ht 5\' 8"  (1.727 m)   Wt 208 lb 12.8 oz (94.7 kg)   SpO2 100%   BMI 31.75 kg/m  Physical Exam Constitutional:      Appearance: Normal appearance.  HENT:      Head: Normocephalic and atraumatic.     Mouth/Throat:     Mouth: Mucous membranes are moist.     Pharynx: Oropharynx is clear.  Cardiovascular:     Rate and Rhythm: Normal rate and regular rhythm.  Pulmonary:     Effort: Pulmonary effort is normal.     Breath sounds: Normal breath sounds. No rhonchi or rales.     Comments: On oxygen Musculoskeletal:     Cervical back: Normal range of motion and neck supple.  Neurological:     General: No focal deficit present.     Mental Status: He is alert and oriented to person, place, and time. Mental status is at baseline.  Psychiatric:        Mood and Affect: Mood normal.        Behavior: Behavior normal.        Thought Content: Thought content normal.        Judgment: Judgment normal.      Lab Results:  CBC    Component Value Date/Time   WBC 6.7 10/23/2016 0954   RBC 5.27 10/23/2016 0954   HGB 15.7 10/23/2016 0954   HCT 46.8 10/23/2016 0954   PLT 268.0 10/23/2016 0954   MCV 88.8 10/23/2016 0954   MCH 31.6 04/28/2010 1621   MCHC 33.5 10/23/2016 0954   RDW 14.2 10/23/2016 0954   LYMPHSABS 1.1 10/23/2016 0954   MONOABS 0.6 10/23/2016 0954   EOSABS 0.1 10/23/2016 0954   BASOSABS 0.0 10/23/2016 0954    BMET    Component Value Date/Time   NA 140 04/28/2010 1644   K 5.2 (H) 04/28/2010 1644   CL 105 04/28/2010 1644   GLUCOSE 80 04/28/2010 1644   BUN 17 04/28/2010 1644   CREATININE 1.0 04/28/2010 1644    BNP No results found for: BNP  ProBNP No results found for: PROBNP  Imaging: No results found.   Assessment & Plan:   COPD GOLD IV/ 02 dep  Treated for AECOPD on 11/09/19, he is some better but continues to have chest/head congestion, productive cough with green mucus and wheezing. He had expiratory wheezing t/o lung fields on exam. Recommend checking CXR today. Plan extend Doxycycline additional 3 days and prednisone taper. Continue Trelegy 100 one puff daily; Albuterol nebulier every 6 hours as needed for breakthrough  shortness of breath/wheezing. He is to take mucinex 600-1200mg  twice daily with full glass of water and use flutter valve three times a  day. Follow-up in 7-10 days or symptoms worsen     Martyn Ehrich, NP 11/17/2019

## 2019-11-17 NOTE — Telephone Encounter (Signed)
Nothing else to suggest over the phone, needs ov with all meds in hand to see NP if can't for my next opening

## 2019-11-19 NOTE — Progress Notes (Signed)
Please let patient know CXR showed no evidence of pneumonia. He has some chronic scarring which is stable.

## 2019-12-22 ENCOUNTER — Telehealth: Payer: Self-pay | Admitting: Internal Medicine

## 2019-12-22 DIAGNOSIS — J9611 Chronic respiratory failure with hypoxia: Secondary | ICD-10-CM

## 2019-12-22 DIAGNOSIS — J449 Chronic obstructive pulmonary disease, unspecified: Secondary | ICD-10-CM

## 2019-12-22 MED ORDER — TRELEGY ELLIPTA 100-62.5-25 MCG/INH IN AEPB: INHALATION_SPRAY | RESPIRATORY_TRACT | 5 refills | Status: DC

## 2019-12-22 NOTE — Telephone Encounter (Signed)
Called and spoke to pt. Pt is requesting a refill of Trelegy. Rx sent to preferred pharmacy. Pt verbalized understanding and denied any further questions or concerns at this time.

## 2019-12-28 ENCOUNTER — Telehealth: Payer: Self-pay | Admitting: Internal Medicine

## 2019-12-28 NOTE — Telephone Encounter (Signed)
Called and spoke with pt who states he has had worsening congestion and coughing and has coughed up some bloody mucus. Pt stated symptoms were bad yesterday 11/28 but states that he may be some better today but wife stated that his symptoms are still bad today 11/29.  Pt has had to do at least 2-3 neb treatments which he said has helped with his symptoms. Pt has not had to use his rescue inhaler.  Pt is using his trelegy inhaler as prescribed and also has been using his flutter valve.  Pt has had to turn his POC up to 4L to help with his breathing. Stated to pt that we need to get him in for an appt and he verbalized understanding. appt scheduled for pt tomorrow 11/30 with Beth. Nothing further needed.

## 2019-12-29 ENCOUNTER — Other Ambulatory Visit: Payer: Self-pay

## 2019-12-29 ENCOUNTER — Ambulatory Visit (INDEPENDENT_AMBULATORY_CARE_PROVIDER_SITE_OTHER): Payer: Medicare Other

## 2019-12-29 ENCOUNTER — Encounter: Payer: Self-pay | Admitting: Primary Care

## 2019-12-29 ENCOUNTER — Ambulatory Visit (INDEPENDENT_AMBULATORY_CARE_PROVIDER_SITE_OTHER): Payer: Medicare Other | Admitting: Primary Care

## 2019-12-29 ENCOUNTER — Ambulatory Visit (HOSPITAL_COMMUNITY)
Admission: RE | Admit: 2019-12-29 | Discharge: 2019-12-29 | Disposition: A | Discharge status: Home / Self Care | Payer: Medicare Other | Source: Ambulatory Visit | Attending: Primary Care | Admitting: Primary Care

## 2019-12-29 VITALS — BP 150/80 | HR 111 | Ht 68.0 in | Wt 207.4 lb

## 2019-12-29 DIAGNOSIS — R0602 Shortness of breath: Secondary | ICD-10-CM

## 2019-12-29 DIAGNOSIS — R071 Chest pain on breathing: Secondary | ICD-10-CM

## 2019-12-29 DIAGNOSIS — R079 Chest pain, unspecified: Secondary | ICD-10-CM | POA: Diagnosis not present

## 2019-12-29 DIAGNOSIS — J441 Chronic obstructive pulmonary disease with (acute) exacerbation: Secondary | ICD-10-CM

## 2019-12-29 DIAGNOSIS — R042 Hemoptysis: Secondary | ICD-10-CM | POA: Diagnosis not present

## 2019-12-29 DIAGNOSIS — J449 Chronic obstructive pulmonary disease, unspecified: Secondary | ICD-10-CM | POA: Diagnosis not present

## 2019-12-29 DIAGNOSIS — J439 Emphysema, unspecified: Secondary | ICD-10-CM | POA: Diagnosis not present

## 2019-12-29 LAB — D-DIMER, QUANTITATIVE: D-Dimer, Quant: 1.28 mcg/mL FEU — ABNORMAL HIGH (ref <0.50)

## 2019-12-29 LAB — BASIC METABOLIC PANEL
BUN: 18 mg/dL (ref 6–23)
CO2: 31 mEq/L (ref 19–32)
Calcium: 9.2 mg/dL (ref 8.4–10.5)
Chloride: 98 mEq/L (ref 96–112)
Creatinine, Ser: 0.95 mg/dL (ref 0.40–1.50)
GFR: 82.43 mL/min (ref >60.00)
Glucose, Bld: 90 mg/dL (ref 70–99)
Potassium: 4.4 mEq/L (ref 3.5–5.1)
Sodium: 136 mEq/L (ref 135–145)

## 2019-12-29 MED ORDER — CEFDINIR 300 MG PO CAPS: 300.0000 mg | ORAL_CAPSULE | Freq: Two times a day (BID) | ORAL | 0 refills | Status: DC

## 2019-12-29 MED ORDER — METHYLPREDNISOLONE ACETATE 80 MG/ML IJ SUSP
80.0000 mg | Freq: Once | INTRAMUSCULAR | Status: AC
  Administered 2019-12-29: 80 mg via INTRAMUSCULAR

## 2019-12-29 MED ORDER — PREDNISONE 10 MG PO TABS: ORAL_TABLET | ORAL | 0 refills | Status: DC

## 2019-12-29 MED ORDER — IOHEXOL 350 MG/ML SOLN
60.0000 mL | Freq: Once | INTRAVENOUS | Status: AC | PRN
  Administered 2019-12-29: 60 mL via INTRAVENOUS

## 2019-12-29 NOTE — Addendum Note (Signed)
Addended by: Lorretta Harp on: 12/29/2019 10:04 AM   Modules accepted: Orders

## 2019-12-29 NOTE — Progress Notes (Signed)
@Patient  ID: Gilbert Maes., male    DOB: August 04, 1950, 69 y.o.   MRN: [id number]  Chief Complaint  Patient presents with  . Acute Visit    Pt has had increased SOB and is coughing more. Two days ago pt coughed up green phlegm that had some blood mixed in. Pt has had to use more O2.    Referring provider: Maylon Peppers, MD  HPI: 69 year old male, former smoker quit in 2013 (67 pack-year history).  Past medical history significant for COPD Gold 4, chronic respiratory failure with hypoxemia (O2 dependent), Mycobacterium tuberculosis infection, hypertension, obesity. Patient of Dr. Melvyn Novas. Changed back to Trelegy d/t cost.   Previous [facility] pulmonary encounters: 02/19/2019 Patient presents today for 2-week follow-up office visit. He has seen no noticeable improvement in breathing/COPD symptoms since starting back on Breztri. Medication is also not covered by his insurance and will cost him upwards for $700 dollars. He would like to return back to Trelegy. Uses his Albuterol rescue inhaler 1-2 times a day. No reports of significant cough or mucus production. He usually does not require oxygen during the day when sitting at rest, uses when out of the house. Continues to wear 2L oxygen at night. He has been to pulmonary rehab in the past but it has been awhile since he last went. He would like to resume exercise routine but stopped d/t covid pandemic.  05/21/2019 Patient presents today for 3 month follow-up. He has not had any recent COPD exacerbation or hospitalizations since his last visit. States that he has his good days and bad when it comes to his breathing. He has attended pulmonary rehab, interested in returning but would like to go to [facility]. He saw no difference in his breathing compared to Trelegy which is more affordable. He uses albuterol hfa one puff twice a day with improvement . He remains on 2L oxygen continuous. He wears oxygen at night. He had an ONO back in December 2020  which showed no desaturation. States that the pollen has seemed to worsen his COPD symptoms. He has some post nasal drip/nasal congestion. He is not currently taking any over the counter medication. Denies chest tightness, wheezing and cough. He has had both covid vaccines.   09/21/2019 Patient presents today for 4 month follow-up. During last visit, patient was re-referred to pulmonary rehab at [facility]. He needs to re-qualify for oxygen today. He has been doing well lately. No acute symptoms. No recent exacerbations or hospitalization. He needed to use his nebulizer once last week d/t heat/humdity. He continues Trelegy 100 one puff daily. He is in donut hole. He was contacted by pulmonary rehab with [place] but he had wanted to go to [facility]. He had to quit going to [facility] d/t covid. He recently purchased treadmill and plans to start walking at home. He was recently started on Crestor by cardiologist.   11/17/2019 Patient presents today for acute visit, increased chest congestion. He called our office on 11/09/19 with reports of chest congestion, productive cough and shortness of breath. He was sent in prescription for Doxycycline and prednisone taper. He is doing some better but not back to baseline. He finished doxycline yesterday. Continues to have congestion in chest and head. Associated chest tightness and wheezing. He is getting up light green phlegm. He is taking mucinex twice daily as well as flonase nasal spray.   12/29/2019-  Interim hx Patient presents today for acute visit. Reports increased shortness of breath with productive  cough with green/bloody tinge mucus. Symptoms started 3-4 days ago. He was outside at a football game on Saturday in rain/cold before feeling unwell. He has not noticed any hemoptysis since Sunday. He recently had to increase oxygen to 3L pulsed. His father passed away last month. He does have some chest tightness and discomfort in his back. No  significant wheezing.  Patient is reluctant to go to the hospital. Denies f/c/s, nasal congested, N/V/D.   Pulmonary function test: 02/25/17- FVC 2.15 (52%), FEV1 0.88 (28%), ratio 41 Severe obstructive airway disease with severe diffusion capacity consistent with COPD/emphysema   Allergies  Allergen Reactions  . Azithromycin     Wife noted memory loss when patient was on this chronically    Immunization History  Administered Date(s) Administered  . Fluad Quad(high Dose 65+) 11/12/2018  . Influenza Split 01/10/2012  . Influenza, High Dose Seasonal PF 01/10/2012, 10/08/2012, 10/23/2013, 10/19/2014, 11/28/2015, 10/23/2016, 02/11/2018  . Influenza,inj,Quad PF,6+ Mos 10/08/2012, 10/23/2013, 10/19/2014, 11/28/2015  . Influenza-Unspecified 11/11/2015, 10/23/2016  . PFIZER SARS-COV-2 Vaccination 04/17/2019, 05/08/2019  . Pneumococcal Conjugate-13 01/09/2012, 12/21/2013  . Pneumococcal Polysaccharide-23 01/09/2012  . Zoster 05/27/2015    Past Medical History:  Diagnosis Date  . Abdominal bloating   . Atherosclerosis of aorta ([place])   . Chronic respiratory failure with hypoxia (HCC) 04/10/2013   Exertional hypoxemia 04/10/2013  - ONO 2 lpm 01/28/2019 >>> sats ok , no desat      . COPD (chronic obstructive pulmonary disease) ([place])   . COPD GOLD IV/ 02 dep     Quit smoking  2013 at wt = 170  Spiro 10/08/12: FeV1 29% FeV1/FVC 41% Alpha one antitrypsin 04/10/2013   MM  Level 145 Spiro 04/10/2013>> FeV1 26% FVC 56% FeV1/FVC 53% - 11/12/2018  After extensive coaching inhaler device,  effectiveness =    90% hfa so try Breztri 2 bid > not on plan so resumed trelegy and flared first week in November 2020 > rx 12/24/2018 with doxy/pred x 6d  - 01/21/2019 changed b  . Epigastric abdominal pain   . Flank pain 03/24/2015  . Hypertension   . Morbid obesity due to excess calories ([place])  03/20/2016   Wt 170 at smoking cessation   . Mycobacterium tuberculosis infection 05/01/2016   03/2016 AFB sputum culture  positive  . Mycobacterium tuberculosis infection   . Osteoarthritis of multiple joints   . Osteoarthrosis   . Oxygen deficiency   . Tobacco abuse, in remission 01/04/2012   No smoking since December 2013   . Varicose veins     Tobacco History: Social History   Tobacco Use  Smoking Status Former Smoker  . Packs/day: 1.50  . Years: 45.00  . Pack years: 67.50  . Types: Cigarettes  . Quit date: 12/31/2011  . Years since quitting: 8.0  Smokeless Tobacco Never Used   Counseling given: Not Answered   Outpatient Medications Prior to Visit  Medication Sig Dispense Refill  . albuterol (PROVENTIL) (2.5 MG/3ML) 0.083% nebulizer solution Take 3 mLs (2.5 mg total) by nebulization every 6 (six) hours as needed for wheezing or shortness of breath. 360 mL 11  . albuterol (VENTOLIN HFA) 108 (90 Base) MCG/ACT inhaler INHALE 1 PUFF INTO THE LUNGS EVERY 6 (SIX) HOURS AS NEEDED FOR WHEEZING OR SHORTNESS OF BREATH. 8 g 4  . aspirin EC 81 MG tablet Take 1 tablet (81 mg total) by mouth daily. Swallow whole. 90 tablet 3  . busPIRone (BUSPAR) 15 MG tablet Take 0.5 tablets (7.5 mg total)  by mouth 2 (two) times daily. 90 tablet 3  . fluticasone (FLONASE) 50 MCG/ACT nasal spray Place 1 spray into both nostrils daily. 16 g 2  . Fluticasone-Umeclidin-Vilant (TRELEGY ELLIPTA) 100-62.5-25 MCG/INH AEPB TAKE 1 PUFF BY MOUTH EVERY DAY 28 each 5  . OXYGEN Oxygen 2.5 lpm with sleep and as needed during the day, 3lpm with exertion    . Respiratory Therapy Supplies (FLUTTER) DEVI Use as directed 1 each 0  . rosuvastatin (CRESTOR) 5 MG tablet Take 1 tablet (5 mg total) by mouth daily. 90 tablet 3  . doxycycline (VIBRA-TABS) 100 MG tablet Take 1 tablet (100 mg total) by mouth 2 (two) times daily. 6 tablet 0  . loratadine (CLARITIN) 10 MG tablet TAKE 1 TABLET BY MOUTH EVERY DAY 90 tablet 1  . predniSONE (DELTASONE) 10 MG tablet Take 4 tabs po daily x 2 days; then 3 tabs for 2 days; then 2 tabs for 2 days; then 1 tab for 2  days 20 tablet 0   No facility-administered medications prior to visit.   Review of Systems  Review of Systems  Constitutional: Negative for chills, diaphoresis and fever.  Respiratory: Positive for cough, chest tightness and shortness of breath. Negative for wheezing.    Physical Exam  BP (!) 150/80 (BP Location: Left Arm, Cuff Size: Large)   Pulse (!) 111   Ht 5\' 8"  (1.727 m)   Wt 207 lb 6.4 oz (94.1 kg)   SpO2 92%   BMI 31.54 kg/m  Physical Exam Constitutional:      Appearance: Normal appearance.  HENT:     Head: Normocephalic and atraumatic.  Cardiovascular:     Rate and Rhythm: Normal rate and regular rhythm.  Pulmonary:     Breath sounds: No wheezing.     Comments: LS diminished  Musculoskeletal:     Comments: In [facility]  Neurological:     Mental Status: He is alert.  Psychiatric:        Mood and Affect: Mood normal.        Behavior: Behavior normal.        Thought Content: Thought content normal.        Judgment: Judgment normal.      Lab Results:  CBC    Component Value Date/Time   WBC 6.7 10/23/2016 0954   RBC 5.27 10/23/2016 0954   HGB 15.7 10/23/2016 0954   HCT 46.8 10/23/2016 0954   PLT 268.0 10/23/2016 0954   MCV 88.8 10/23/2016 0954   MCH 31.6 04/28/2010 1621   MCHC 33.5 10/23/2016 0954   RDW 14.2 10/23/2016 0954   LYMPHSABS 1.1 10/23/2016 0954   MONOABS 0.6 10/23/2016 0954   EOSABS 0.1 10/23/2016 0954   BASOSABS 0.0 10/23/2016 0954    BMET    Component Value Date/Time   NA 140 04/28/2010 1644   K 5.2 (H) 04/28/2010 1644   CL 105 04/28/2010 1644   GLUCOSE 80 04/28/2010 1644   BUN 17 04/28/2010 1644   CREATININE 1.0 04/28/2010 1644    BNP No results found for: BNP  ProBNP No results found for: PROBNP  Imaging: DG Chest 2 View  Result Date: 12/29/2019 CLINICAL DATA:  Hemoptysis.  History of COPD. EXAM: CHEST - 2 VIEW COMPARISON:  PA and lateral chest 11/17/2019 06/04/2016. FINDINGS: The chest is hyperexpanded with attenuation of  the pulmonary vasculature. Lungs are clear. No pneumothorax or pleural fluid. Heart size is normal. No acute or focal bony abnormality. IMPRESSION: No acute disease. Emphysema (ICD10-J43.9). Electronically  Signed   By: Inge Rise M.D.   On: 12/29/2019 09:40     Assessment & Plan:   COPD GOLD IV/ 02 dep  - Reports increased shortness of breath and productive cough x 3-4 days. Cough is green with slight blood tinge. He has had no hemoptysis since 12/27/19. He is not on blood thinner. CXR today showed no acute disease, emphysema. Checking d-dimer to rule out blood clot. If elevated needs stat CTA. Patient declined ED evaluation. Given depo-medrol 80mg  IM injection today in office. Sending in RX for omnicef and prednisone taper for AECOPD. He has a follow-up with Dr. Melvyn Novas in 1 week. He is aware to present to ED if symptoms worsen or O2 <88%.    Martyn Ehrich, NP 12/29/2019

## 2019-12-29 NOTE — Addendum Note (Signed)
Addended by: Martyn Ehrich on: 12/29/2019 12:27 PM   Modules accepted: Orders

## 2019-12-29 NOTE — Assessment & Plan Note (Addendum)
-   Reports increased shortness of breath and productive cough x 3-4 days. Cough is green with slight blood tinge. He has had no hemoptysis since 12/27/19. He is not on blood thinner. CXR today showed no acute disease, emphysema. Checking d-dimer to rule out blood clot. If elevated needs stat CTA. Patient declined ED evaluation. Given depo-medrol 80mg  IM injection today in office. Sending in RX for omnicef and prednisone taper for AECOPD. He has a follow-up with Dr. Melvyn Novas in 1 week. He is aware to present to ED if symptoms worsen or O2 <88%.

## 2019-12-29 NOTE — Patient Instructions (Addendum)
Recommendations: Continue Trelegy 100 one puff daily Use Proventil nebulizer every 4-6 hours scheduled  Monitor O2 if sustaining <90% increase O2 if you do not recover need to seek emergency care   Office treatment: Depo-medrol 80mg  IM injection  Orders: CXR stat today Labs (D-dimer, BMET)  RX: Omnicef1 tab daily x 7 days Prednisone taper   Follow-up: 1 week FU with Gilbert Jefferson

## 2019-12-29 NOTE — Progress Notes (Signed)
D-dimer was elevated, needs CTA stat. We will need to hold him until read.

## 2020-01-04 ENCOUNTER — Telehealth: Payer: Self-pay | Admitting: Internal Medicine

## 2020-01-04 NOTE — Telephone Encounter (Signed)
Called and spoke with pt and advised of recommendations per Dr Melvyn Novas. Pt verbalized understanding and is aware to keep his appt on 01/05/20. Pt advised to call us back if symptoms worsen before then. Nothing further needed at this time.

## 2020-01-04 NOTE — Telephone Encounter (Signed)
Called and spoke with Patient's Wife Gilbert Jefferson (DPR). Gilbert Jefferson stated Patient was prescribed Omnicef 12/29/19, by Beth,NP. Gilbert Jefferson stated Patient's face swelled, redness on his face, and hives on his body Saturday 01/02/20. Patient took Benadryl and hives, swelling went away. Patient has not taken [name] since Saturday. Patient is still having productive cough,with  green sputum. Gilbert Jefferson stated Patient is scheduled with Dr. Melvyn Novas 01/05/20 at 1015. Gilbert Jefferson did not know if Dr. Melvyn Novas wanted to prescribe Patient a different antibiotic, or wait until OV 01/05/20.   LOV 12/29/19, with Beth,NP  Instructions  Recommendations: Continue Trelegy 100 one puff daily Use Proventil nebulizer every 4-6 hours scheduled  Monitor O2 if sustaining <90% increase O2 if you do not recover need to seek emergency care   Office treatment: Depo-medrol 80mg  IM injection  Orders: CXR stat today Labs (D-dimer, BMET)  RX: Omnicef1 tab daily x 7 days Prednisone taper   Follow-up: 1 week FU with Dr. Melvyn Novas      Message routed to Dr. Melvyn Novas to advise

## 2020-01-04 NOTE — Telephone Encounter (Signed)
Unless resp symptoms are worsening would like to wait and regroup at [facility]

## 2020-01-05 ENCOUNTER — Encounter: Payer: Self-pay | Admitting: Internal Medicine

## 2020-01-05 ENCOUNTER — Other Ambulatory Visit: Payer: Self-pay

## 2020-01-05 ENCOUNTER — Ambulatory Visit (INDEPENDENT_AMBULATORY_CARE_PROVIDER_SITE_OTHER): Payer: Medicare Other

## 2020-01-05 ENCOUNTER — Ambulatory Visit (INDEPENDENT_AMBULATORY_CARE_PROVIDER_SITE_OTHER): Payer: Medicare Other | Admitting: Internal Medicine

## 2020-01-05 DIAGNOSIS — J441 Chronic obstructive pulmonary disease with (acute) exacerbation: Secondary | ICD-10-CM

## 2020-01-05 DIAGNOSIS — J9611 Chronic respiratory failure with hypoxia: Secondary | ICD-10-CM

## 2020-01-05 DIAGNOSIS — R059 Cough, unspecified: Secondary | ICD-10-CM | POA: Diagnosis not present

## 2020-01-05 DIAGNOSIS — I2510 Atherosclerotic heart disease of native coronary artery without angina pectoris: Secondary | ICD-10-CM

## 2020-01-05 DIAGNOSIS — J439 Emphysema, unspecified: Secondary | ICD-10-CM | POA: Diagnosis not present

## 2020-01-05 DIAGNOSIS — J449 Chronic obstructive pulmonary disease, unspecified: Secondary | ICD-10-CM | POA: Diagnosis not present

## 2020-01-05 DIAGNOSIS — J90 Pleural effusion, not elsewhere classified: Secondary | ICD-10-CM | POA: Diagnosis not present

## 2020-01-05 MED ORDER — PREDNISONE 10 MG PO TABS: ORAL_TABLET | ORAL | 0 refills | Status: DC

## 2020-01-05 MED ORDER — DOXYCYCLINE HYCLATE 100 MG PO TABS: 100.0000 mg | ORAL_TABLET | Freq: Two times a day (BID) | ORAL | 0 refills | Status: DC

## 2020-01-05 NOTE — Assessment & Plan Note (Signed)
Onset tgiving w/e  2021 with purulent sputum and allergy to omnicef   rec Doxy x 10 days ac bid  Pred x 12 day taper Albuterol neb up to every 4 hours with goal of back to just needing alb hfa less than once or twice daily   Re saba: I spent extra time with pt today reviewing appropriate use of albuterol for prn use on exertion with the following points: 1) saba is for relief of sob that does not improve by walking a slower pace or resting but rather if the pt does not improve after trying this first. 2) If the pt is convinced, as many are, that saba helps recover from activity faster then it's easy to tell if this is the case by re-challenging : ie stop, take the inhaler, then p 5 minutes try the exact same activity (intensity of workload) that just caused the symptoms and see if they are substantially diminished or not after saba 3) if there is an activity that reproducibly causes the symptoms, try the saba 15 min before the activity on alternate days   If in fact the saba really does help, then fine to continue to use it prn but advised may need to look closer at the maintenance regimen being used to achieve better control of airways disease with exertion >> for now  Group D in terms of symptom/risk and laba/lama/ICS  therefore appropriate rx at this point >>>  Continue symb 160 and spiriva and f/u in 3 m, sooner if needed

## 2020-01-05 NOTE — Assessment & Plan Note (Signed)
Exertional hypoxemia 04/10/2013  - ONO 2 lpm 01/28/2019    Reviewed goal of maint > 90%  At all times         Each maintenance medication was reviewed in detail including emphasizing most importantly the difference between maintenance and prns and under what circumstances the prns are to be triggered using an action plan format where appropriate.  Total time for H and P, chart review, counseling, teaching device and generating customized AVS unique to this acute office visit / charting  > 40 min

## 2020-01-05 NOTE — Progress Notes (Signed)
Mordecai Maes., male    DOB: Jul 21, 1950,   MRN: [id number]   Brief patient profile:  72   yowm MM quit smoking 2013   With GOLD IV/Group D on 02 since around 2015   Baseline wt  170 @ quit smoking   History of Present Illness  11/12/2018  Pulmonary/ 1st office eval/Eshawn Coor  Chief Complaint  Patient presents with  . Follow-up    Breathing has been worse over the past 3 wks. He is using his albuterol inhaler 2 x per day and has not used neb.   Dyspnea:  Used to do treadmill at [facility]  slow speed/flat x hour daily while on 3lpm last done  in March 2020  Worse x 3 weeks doe, gradual  Cough: slt green and thick this am only, o/w very little mucus, some upper airway cough though  Sleep: 2 pillows, on side, bed is flat SABA use: twice daily plus / trelegy  02  Sleeps on 2.5 lpm , not using at rest or around the house, adjusts with activity out of house rec Plan A = Automatic = Always=   Breztri in place of trelegy   Take 2 puffs first thing in am and then another 2 puffs about 12 hours later.  Work on inhaler technique:  Plan B = Backup (to supplement plan A, not to replace it) Only use your albuterol inhaler as a rescue medication Plan C = Crisis (instead of Plan B but only if Plan B stops working) - only use your albuterol nebulizer if you first try Plan B  If you don't like the breztri go back to the trelegy  Prednisone 10 mg take  4 each am x 2 days,   2 each am x 2 days,  1 each am x 2 days and stop  Doxycycline 100 mg twice daily until done Flutter valve as needed for cough/ congestion Adjust your 02 flow to keep your sats above 90%  Please schedule a follow up office visit in 6 weeks, call sooner if needed  Add needs pneumovax on return.     01/05/2020  f/u ov/Amoy Steeves re: GOLD IV / 02 dep  Flare really Thxgiving weekend  Chief Complaint  Patient presents with  . Follow-up    Patient has severe shortness of breath with exertion, gets out of breath very quick with any exertion,  congestion in his chest feels like he cant get it up.   Dyspnea:  Just getting out of chair on last day of prednisone  Cough: rattling congested cough/ still green last dose of omnicef 01/02/20  Using flutter valve and mucinex d  Sleeping: ok on side/ bed  Is flat/ 2 pillows  SABA MPN:TIRWERXV no more than hfa 2x, now using both up to 3 x  02: 2.5 lpm hs but having to turn up to 3 POC now daytime    No obvious day to day or daytime variability or assoc  mucus plugs or hemoptysis or cp or chest tightness, subjective wheeze or overt sinus or hb symptoms.   Sleeping  without nocturnal  or early am exacerbation  of respiratory  c/o's or need for noct saba. Also denies any obvious fluctuation of symptoms with weather or environmental changes or other aggravating or alleviating factors except as outlined above   No unusual exposure hx or h/o childhood pna/ asthma or knowledge of premature birth.  Current Allergies, Complete Past Medical History, Past Surgical History, Family History, and Social History were  reviewed in [facility] record.  ROS  The following are not active complaints unless bolded Hoarseness, sore throat, dysphagia, dental problems, itching, sneezing,  nasal congestion or discharge of excess mucus or purulent secretions, ear ache,   fever, chills, sweats, unintended wt loss or wt gain, classically pleuritic or exertional cp,  orthopnea pnd or arm/hand swelling  or leg swelling, presyncope, palpitations, abdominal pain, anorexia, nausea, vomiting, diarrhea  or change in bowel habits or change in bladder habits, change in stools or change in urine, dysuria, hematuria,  rash, arthralgias, visual complaints, headache, numbness, weakness or ataxia or problems with walking or coordination,  change in mood or  memory.        Current Meds  Medication Sig  . albuterol (PROVENTIL) (2.5 MG/3ML) 0.083% nebulizer solution Take 3 mLs (2.5 mg total) by nebulization every 6  (six) hours as needed for wheezing or shortness of breath.  Marland Kitchen albuterol (VENTOLIN HFA) 108 (90 Base) MCG/ACT inhaler INHALE 1 PUFF INTO THE LUNGS EVERY 6 (SIX) HOURS AS NEEDED FOR WHEEZING OR SHORTNESS OF BREATH.  Marland Kitchen aspirin EC 81 MG tablet Take 1 tablet (81 mg total) by mouth daily. Swallow whole.  . busPIRone (BUSPAR) 15 MG tablet Take 0.5 tablets (7.5 mg total) by mouth 2 (two) times daily.  . fluticasone (FLONASE) 50 MCG/ACT nasal spray Place 1 spray into both nostrils daily.  . Fluticasone-Umeclidin-Vilant (TRELEGY ELLIPTA) 100-62.5-25 MCG/INH AEPB TAKE 1 PUFF BY MOUTH EVERY DAY  . OXYGEN Oxygen 2.5 lpm with sleep and as needed during the day, 3lpm with exertion  . predniSONE (DELTASONE) 10 MG tablet Take 4 tabs po daily x 2 days; then 3 tabs for 2 days; then 2 tabs for 2 days; then 1 tab for 2 days  . Respiratory Therapy Supplies (FLUTTER) DEVI Use as directed  . rosuvastatin (CRESTOR) 5 MG tablet Take 1 tablet (5 mg total) by mouth daily.        Past Medical History:  Diagnosis Date  . COPD (chronic obstructive pulmonary disease) ([place])   . Hypertension   . Osteoarthrosis   . Varicose veins        Objective:     Wt Readings from Last 3 Encounters:  01/05/20 202 lb 12.8 oz (92 kg)  12/29/19 207 lb 6.4 oz (94.1 kg)  11/17/19 208 lb 12.8 oz (94.7 kg)     Vital signs reviewed - Note on arrival 01/05/2020  02 sats  88% on 3lpm POC and up to 94%        HEENT : pt wearing mask not removed for exam due to covid -19 concerns.    NECK :  without JVD/Nodes/TM/ nl carotid upstrokes bilaterally   LUNGS: no acc muscle use,  Mod barrel  contour chest wall with bilateral  Distant bs s audible wheeze and  without cough on insp or exp maneuvers and mod  Hyperresonant  to  percussion bilaterally     CV:  RRR  no s3 or murmur or increase in P2, and no edema   ABD:  soft and nontender with pos mid insp Hoover's  in the supine position. No bruits or organomegaly appreciated, bowel sounds  nl  MS:     ext warm without deformities, calf tenderness, cyanosis or clubbing No obvious joint restrictions   SKIN: warm and dry without lesions    NEURO:  alert, approp, nl sensorium with  no motor or cerebellar deficits apparent.        CXR PA  and Lateral:   01/05/2020 :    I personally reviewed images and  impression as follows:   Mod copd / no acute changes        Assessment        Christinia Gully, MD 11/12/2018

## 2020-01-05 NOTE — Patient Instructions (Addendum)
Plan A = Automatic = Always=    trelegy   Plan B = Backup (to supplement plan A, not to replace it) Only use your albuterol inhaler as a rescue medication to be used if you can't catch your breath by resting or doing a relaxed purse lip breathing pattern.  - The less you use it, the better it will work when you need it. - Ok to use the inhaler up to 2 puffs  every 4 hours if you must but call for appointment if use goes up over your usual need - Don't leave home without it !!  (think of it like the spare tire for your car)   Plan C = Crisis (instead of Plan B but only if Plan B stops working) - only use your albuterol nebulizer if you first try Plan B and it fails to help > ok to use the nebulizer up to every 4 hours but if start needing it regularly call for immediate appointment  For cough / congestion max dose of mucinex 1200  Mg every 12 hours with the flutter valve   Doxycline 100 mg twice daily x 10 days  - take it before meals with glass of water   Take Prednisone 10 mg x  4 for three days 3 for three days 2 for three days 1 for three days and stop   Try prilosec otc 20mg   Take 30-60 min before first meal of the day and Pepcid ac (famotidine) 20 mg one after supper until cough is completely gone for at least a week without the need for cough suppression  GERD (REFLUX)  is an extremely common cause of respiratory symptoms just like yours , many times with no obvious heartburn at all.    It can be treated with medication, but also with lifestyle changes including elevation of the head of your bed (ideally with 6 -8inch blocks under the headboard of your bed),  Smoking cessation, avoidance of late meals, excessive alcohol, and avoid fatty foods, chocolate, peppermint, colas, red wine, and acidic juices such as orange juice.  NO MINT OR MENTHOL PRODUCTS SO NO COUGH DROPS  USE SUGARLESS CANDY INSTEAD (Jolley ranchers or Stover's or Life Savers) or even ice chips will also do - the key is to  swallow to prevent all throat clearing. NO OIL BASED VITAMINS - use powdered substitutes.  Avoid fish oil when coughing.  Please remember to go to the  x-ray department  for your tests - we will call you with the results when they are available    Please schedule a follow up visit in 3 months but call sooner if needed

## 2020-01-06 ENCOUNTER — Encounter: Payer: Self-pay | Admitting: *Deleted

## 2020-04-08 ENCOUNTER — Telehealth: Payer: Self-pay | Admitting: Internal Medicine

## 2020-04-08 MED ORDER — DOXYCYCLINE HYCLATE 100 MG PO TABS: 100.0000 mg | ORAL_TABLET | Freq: Two times a day (BID) | ORAL | 0 refills | Status: DC

## 2020-04-08 MED ORDER — PREDNISONE 10 MG PO TABS: ORAL_TABLET | ORAL | 0 refills | Status: DC

## 2020-04-08 NOTE — Telephone Encounter (Signed)
Called and spoke with patient. He verbalized understanding of MW's recommendations. Will go ahead and send in the doxy and prednisone to [facility] [place].   Nothing further needed at time of call.

## 2020-04-08 NOTE — Telephone Encounter (Signed)
Primary Pulmonologist: MW Last office visit and with whom: 01/05/20 What do we see them for (pulmonary problems): COPD Last OV assessment/plan:Plan A = Automatic = Always=    trelegy   Plan B = Backup (to supplement plan A, not to replace it) Only use your albuterol inhaler as a rescue medication to be used if you can't catch your breath by resting or doing a relaxed purse lip breathing pattern.  - The less you use it, the better it will work when you need it. - Ok to use the inhaler up to 2 puffs  every 4 hours if you must but call for appointment if use goes up over your usual need - Don't leave home without it !!  (think of it like the spare tire for your car)   Plan C = Crisis (instead of Plan B but only if Plan B stops working) - only use your albuterol nebulizer if you first try Plan B and it fails to help > ok to use the nebulizer up to every 4 hours but if start needing it regularly call for immediate appointment  For cough / congestion max dose of mucinex 1200  Mg every 12 hours with the flutter valve   Doxycline 100 mg twice daily x 10 days  - take it before meals with glass of water   Take Prednisone 10 mg x  4 for three days 3 for three days 2 for three days 1 for three days and stop   Try prilosec otc 20mg   Take 30-60 min before first meal of the day and Pepcid ac (famotidine) 20 mg one after supper until cough is completely gone for at least a week without the need for cough suppression  GERD (REFLUX)  is an extremely common cause of respiratory symptoms just like yours , many times with no obvious heartburn at all.    It can be treated with medication, but also with lifestyle changes including elevation of the head of your bed (ideally with 6 -8inch blocks under the headboard of your bed),  Smoking cessation, avoidance of late meals, excessive alcohol, and avoid fatty foods, chocolate, peppermint, colas, red wine, and acidic juices such as orange juice.  NO MINT OR  MENTHOL PRODUCTS SO NO COUGH DROPS  USE SUGARLESS CANDY INSTEAD (Jolley ranchers or Stover's or Life Savers) or even ice chips will also do - the key is to swallow to prevent all throat clearing. NO OIL BASED VITAMINS - use powdered substitutes.  Avoid fish oil when coughing.  Please remember to go to the  x-ray department  for your tests - we will call you with the results when they are available    Please schedule a follow up visit in 3 months but call sooner if needed    Was appointment offered to patient (explain)?  Patient wanted recommendations first.    Reason for call: Spoke with patient. He stated that he has noticed an increase in his chest congestion that started on 04/05/20. He started doing neb treatments twice daily for relief. The congestion has continued but his SOB has gone back to baseline. He has a productive cough with clear phlegm. He denied any body aches or fevers. He is still using his Trelegy 141mcg daily. Also denied any chest pain or back pain. He stated that he normally gets like with extreme weather changes.   He wants to know if MW will send in an antibiotic because he gets worse. [facility]  [place].   MW, can you please advise? Thanks.   (examples of things to ask: : When did symptoms start? Fever? Cough? Productive? Color to sputum? More sputum than usual? Wheezing? Have you needed increased oxygen? Are you taking your respiratory medications? What over the counter measures have you tried?)  Allergies  Allergen Reactions  . Cefdinir Hives  . Azithromycin     Wife noted memory loss when patient was on this chronically    Immunization History  Administered Date(s) Administered  . Fluad Quad(high Dose 65+) 11/12/2018  . Influenza Split 01/10/2012  . Influenza, High Dose Seasonal PF 01/10/2012, 10/08/2012, 10/23/2013, 10/19/2014, 11/28/2015, 10/23/2016, 02/11/2018  . Influenza,inj,Quad PF,6+ Mos 10/08/2012, 10/23/2013, 10/19/2014, 11/28/2015  .  Influenza-Unspecified 11/11/2015, 10/23/2016  . PFIZER(Purple Top)SARS-COV-2 Vaccination 04/17/2019, 05/08/2019  . Pneumococcal Conjugate-13 01/09/2012, 12/21/2013  . Pneumococcal Polysaccharide-23 01/09/2012  . Zoster 05/27/2015

## 2020-04-08 NOTE — Telephone Encounter (Signed)
Generally abx don't work unless mucus is discolored but since going into weekend rec :  Doxy x 7 days 100 mg bid to have on hand if mucus darkens  For worse sob/cough/ congestion or increase need for neb over baseline> Prednisone 10 mg take  4 each am x 2 days,   2 each am x 2 days,  1 each am x 2 days and stop   The two are not mutually exclusive (can do both at same time as at last ov)  Keep appt as planned - call sooner if needed

## 2020-04-13 ENCOUNTER — Telehealth: Payer: Self-pay | Admitting: Internal Medicine

## 2020-04-13 NOTE — Telephone Encounter (Signed)
This was received and processed by me today while doing results/refills.  Nothing further needed at this time- will close encounter.

## 2020-04-15 ENCOUNTER — Telehealth: Payer: Self-pay | Admitting: Internal Medicine

## 2020-04-15 MED ORDER — ALBUTEROL SULFATE (2.5 MG/3ML) 0.083% IN NEBU: 2.5000 mg | INHALATION_SOLUTION | Freq: Four times a day (QID) | RESPIRATORY_TRACT | 2 refills | Status: DC | PRN

## 2020-04-15 NOTE — Telephone Encounter (Signed)
Gilbert Paganini do you have any papers for [employer] on this medication?  thanks

## 2020-04-15 NOTE — Telephone Encounter (Signed)
I have not received a fax  Went ahead and sent refill on albuterol nebs electronically to [facility]

## 2020-04-19 ENCOUNTER — Telehealth: Payer: Self-pay | Admitting: Internal Medicine

## 2020-04-19 IMAGING — DX DG CHEST 2V
2 series · 2 of 2 positions shown · non-contrast
Comparison: 02/11/2018

CLINICAL DATA: COPD, chronic bronchitis.

EXAM:
CHEST - 2 VIEW

[chest pa]
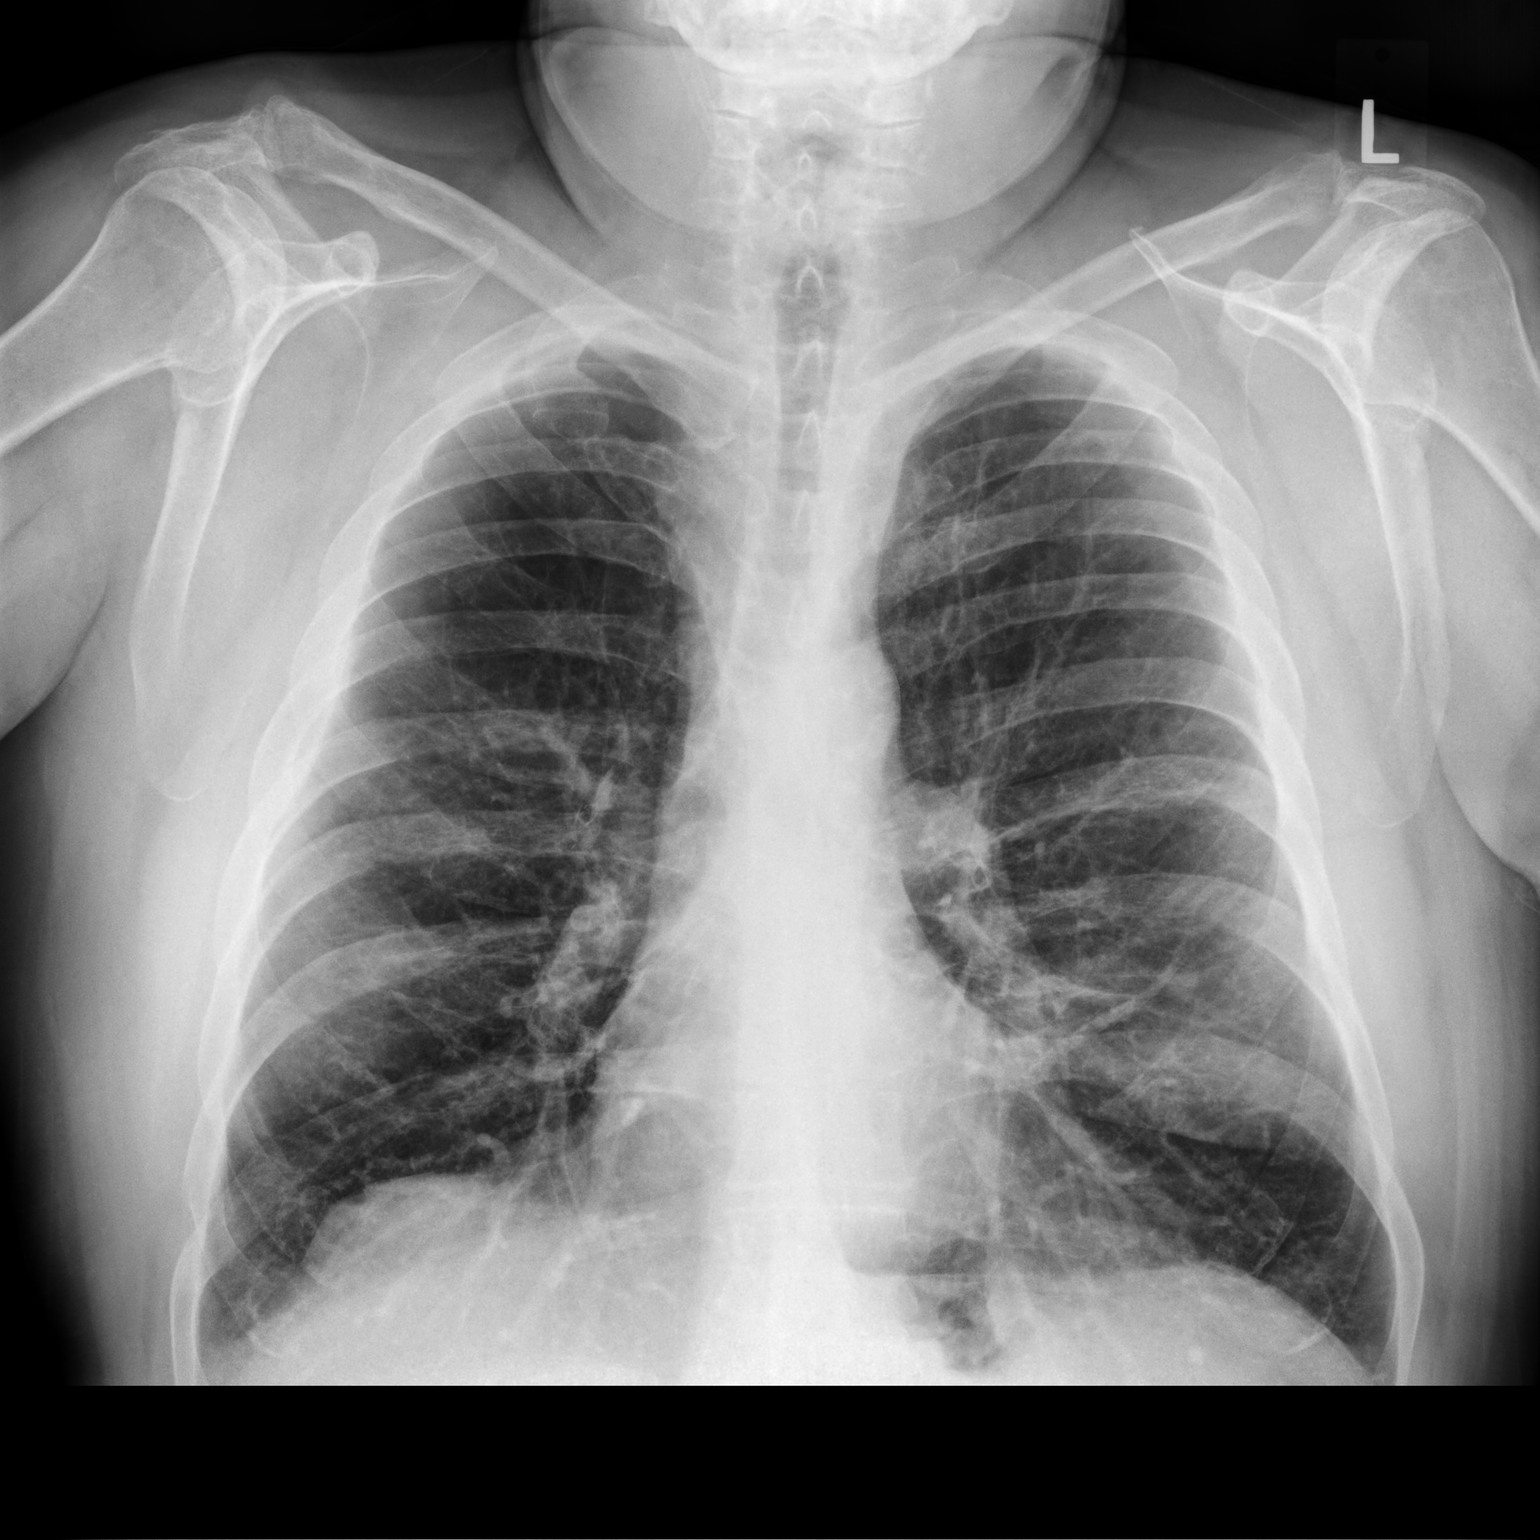

[chest lat]
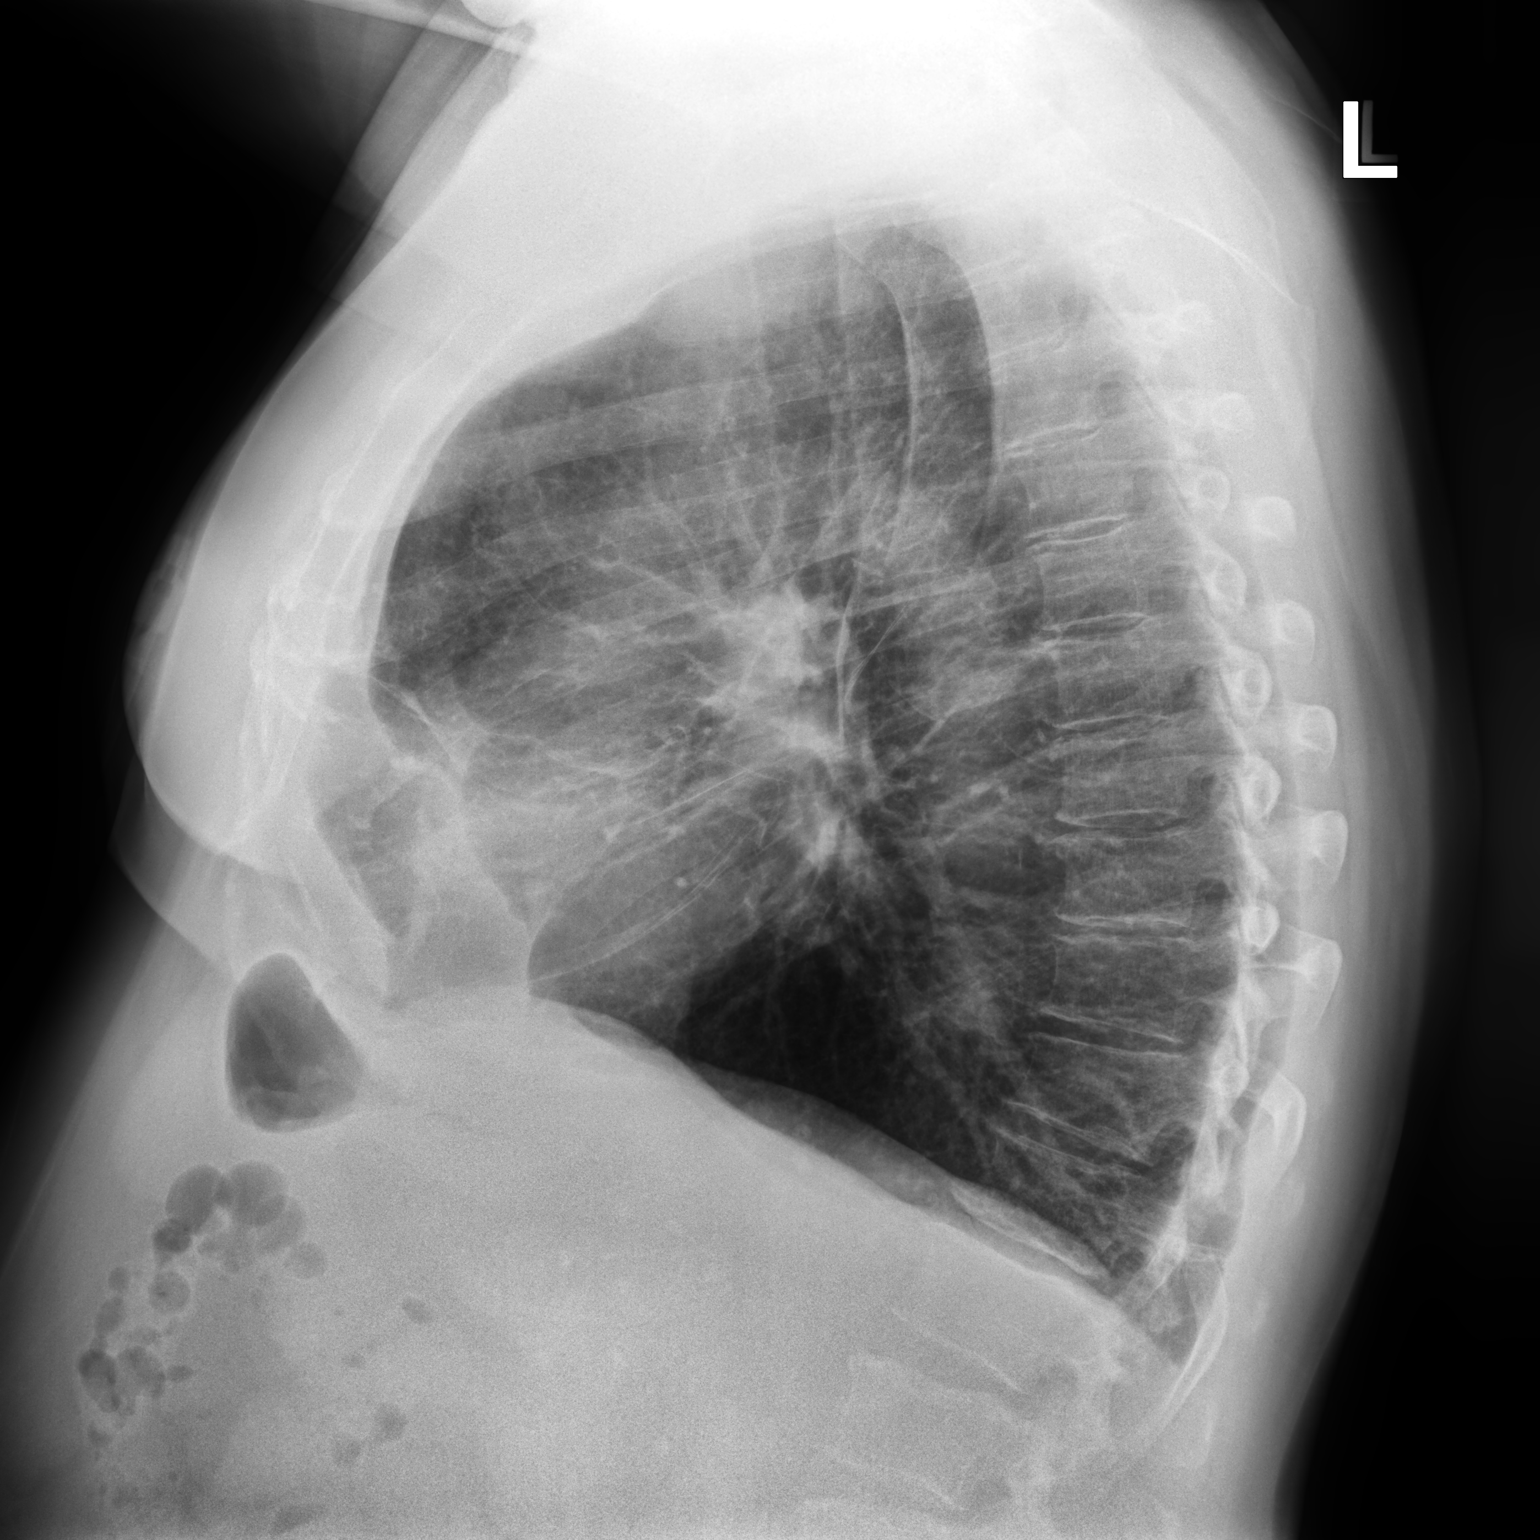

[2 of 2 positions shown; findings below may reference images not displayed]

FINDINGS: Lungs are hyperinflated.  There is mild central airway thickening.

Cardiomediastinal contours are stable.

No acute bone process.
IMPRESSION: Hyperexpansion without acute cardiopulmonary disease.

## 2020-04-19 MED ORDER — ALBUTEROL SULFATE (2.5 MG/3ML) 0.083% IN NEBU: 2.5000 mg | INHALATION_SOLUTION | Freq: Four times a day (QID) | RESPIRATORY_TRACT | 2 refills | Status: DC | PRN

## 2020-04-19 NOTE — Telephone Encounter (Signed)
rx has been sent to [facility] per request for refill.

## 2020-05-23 ENCOUNTER — Other Ambulatory Visit: Payer: Self-pay | Admitting: Internal Medicine

## 2020-06-20 ENCOUNTER — Other Ambulatory Visit: Payer: Self-pay | Admitting: Pulmonary Disease

## 2020-07-15 ENCOUNTER — Other Ambulatory Visit: Payer: Self-pay | Admitting: Cardiovascular Disease

## 2020-07-15 ENCOUNTER — Other Ambulatory Visit: Payer: Self-pay | Admitting: Primary Care

## 2020-07-15 DIAGNOSIS — J449 Chronic obstructive pulmonary disease, unspecified: Secondary | ICD-10-CM

## 2020-07-15 DIAGNOSIS — J9611 Chronic respiratory failure with hypoxia: Secondary | ICD-10-CM

## 2020-10-02 ENCOUNTER — Other Ambulatory Visit: Payer: Self-pay | Admitting: Cardiovascular Disease

## 2020-10-13 ENCOUNTER — Other Ambulatory Visit: Payer: Self-pay | Admitting: Cardiovascular Disease

## 2020-10-30 ENCOUNTER — Other Ambulatory Visit: Payer: Self-pay | Admitting: Cardiovascular Disease

## 2020-11-09 ENCOUNTER — Other Ambulatory Visit: Payer: Self-pay | Admitting: Cardiovascular Disease

## 2020-11-29 ENCOUNTER — Other Ambulatory Visit: Payer: Self-pay | Admitting: Internal Medicine

## 2020-11-30 ENCOUNTER — Telehealth: Payer: Self-pay | Admitting: Internal Medicine

## 2020-11-30 MED ORDER — PAXLOVID (300/100) 20 X 150 MG & 10 X 100MG PO TBPK: ORAL_TABLET | ORAL | 0 refills | Status: DC

## 2020-11-30 NOTE — Telephone Encounter (Signed)
Called and spoke with patient. He verbalized understanding. RX has been sent to the pharmacy.   Nothing further needed at time of call.  

## 2020-11-30 NOTE — Telephone Encounter (Signed)
Call made to patient, confirmed DOB. He wanted to let Dr Melvyn Novas know he tested positive for Covid. He is requesting the covid pill be sent in to his pharmacy. He states he woke up this morning sweaty and a little more SOB than his baseline. No fever. Denies chest pain, chills, or body aches. He confirms he is using his trelegy daily and albuterol neb twice daily since yesterday. Denies all other symptoms. Denies he is taking anything OTC. Reports he is just weak and fatigued.

## 2020-11-30 NOTE — Telephone Encounter (Signed)
I have not seen in almost a year and usually prefer PCP rx but his creatinine is normal so ok to rx paxlovid with f/u by PCP in 2 weeks and our office if resp status declines.

## 2020-12-03 ENCOUNTER — Other Ambulatory Visit: Payer: Self-pay | Admitting: Cardiovascular Disease

## 2021-01-25 ENCOUNTER — Telehealth: Payer: Self-pay | Admitting: Internal Medicine

## 2021-01-25 ENCOUNTER — Other Ambulatory Visit: Payer: Self-pay | Admitting: Primary Care

## 2021-01-25 DIAGNOSIS — J9611 Chronic respiratory failure with hypoxia: Secondary | ICD-10-CM

## 2021-01-25 DIAGNOSIS — J449 Chronic obstructive pulmonary disease, unspecified: Secondary | ICD-10-CM

## 2021-01-26 ENCOUNTER — Other Ambulatory Visit: Payer: Self-pay | Admitting: Internal Medicine

## 2021-01-26 MED ORDER — TRELEGY ELLIPTA 100-62.5-25 MCG/ACT IN AEPB: 1.0000 | INHALATION_SPRAY | Freq: Every day | RESPIRATORY_TRACT | 0 refills | Status: DC

## 2021-01-26 NOTE — Telephone Encounter (Signed)
Patient is past due for appt.  One month supply of trelegy has been sent to preferred pharmacy.  Appt scheduled for 02/14/2021 at 4:15. Patient is aware and voiced his understanding.  Nothing further needed at this time.

## 2021-01-26 NOTE — Addendum Note (Signed)
Addended by: Claudette Head A on: 01/26/2021 12:43 PM   Modules accepted: Orders

## 2021-02-14 ENCOUNTER — Other Ambulatory Visit: Payer: Self-pay

## 2021-02-14 ENCOUNTER — Encounter: Payer: Self-pay | Admitting: Internal Medicine

## 2021-02-14 ENCOUNTER — Ambulatory Visit (INDEPENDENT_AMBULATORY_CARE_PROVIDER_SITE_OTHER): Payer: Medicare Other | Admitting: Internal Medicine

## 2021-02-14 DIAGNOSIS — J449 Chronic obstructive pulmonary disease, unspecified: Secondary | ICD-10-CM | POA: Diagnosis not present

## 2021-02-14 DIAGNOSIS — J9611 Chronic respiratory failure with hypoxia: Secondary | ICD-10-CM | POA: Diagnosis not present

## 2021-02-14 MED ORDER — PREDNISONE 10 MG PO TABS: ORAL_TABLET | ORAL | 0 refills | Status: AC

## 2021-02-14 NOTE — Patient Instructions (Addendum)
Take Prednisone 10 mg x  4 for three days 3 for three days 2 for three days 1 for three days and stop   Try prilosec otc 20mg   Take 30-60 min before first meal of the day and Pepcid ac (famotidine) 20 mg one after supper until cough is completely gone for at least a week without the need for cough suppression  GERD (REFLUX)  is an extremely common cause of respiratory symptoms just like yours , many times with no obvious heartburn at all.    It can be treated with medication, but also with lifestyle changes including elevation of the head of your bed (ideally with 6 -8inch blocks under the headboard of your bed),  Smoking cessation, avoidance of late meals, excessive alcohol, and avoid fatty foods, chocolate, peppermint, colas, red wine, and acidic juices such as orange juice.  NO MINT OR MENTHOL PRODUCTS SO NO COUGH DROPS  USE SUGARLESS CANDY INSTEAD (Jolley ranchers or Stover's or Life Savers) or even ice chips will also do - the key is to swallow to prevent all throat clearing. NO OIL BASED VITAMINS - use powdered substitutes.  Avoid fish oil when coughing.   Make sure you check your oxygen saturation  AT  your highest level of activity (not after you stop)   to be sure it stays over 90% and adjust  02 flow upward to maintain this level if needed but remember to turn it back to previous settings when you stop (to conserve your supply).   Ok to try albuterol 15 min before an activity (on alternating days)  that you know would usually make you short of breath and see if it makes any difference and if makes none then don't take albuterol after activity unless you can't catch your breath as this means it's the resting that helps, not the albuterol.      Please schedule a follow up office visit in 6 weeks, call sooner if needed

## 2021-02-14 NOTE — Progress Notes (Signed)
Gilbert Maes., male    DOB: 10-10-1950,   MRN: [id number]   Brief patient profile:  54   yowm MM quit smoking 2013   With GOLD IV/Group D on 02 since around 2015   Baseline wt  170 @ quit smoking   History of Present Illness  11/12/2018  Pulmonary/ 1st office eval/[facility]  Chief Complaint  Patient presents with   Follow-up    Breathing has been worse over the past 3 wks. He is using his albuterol inhaler 2 x per day and has not used neb.   Dyspnea:  Used to do treadmill at [facility]  slow speed/flat x hour daily while on 3lpm last done  in March 2020  Worse x 3 weeks doe, gradual  Cough: slt green and thick this am only, o/w very little mucus, some upper airway cough though  Sleep: 2 pillows, on side, bed is flat SABA use: twice daily plus / trelegy  02  Sleeps on 2.5 lpm , not using at rest or around the house, adjusts with activity out of house rec Plan A = Automatic = Always=   Breztri in place of trelegy   Take 2 puffs first thing in am and then another 2 puffs about 12 hours later.  Work on inhaler technique:  Plan B = Backup (to supplement plan A, not to replace it) Only use your albuterol inhaler as a rescue medication Plan C = Crisis (instead of Plan B but only if Plan B stops working) - only use your albuterol nebulizer if you first try Plan B  If you don't like the breztri go back to the trelegy  Prednisone 10 mg take  4 each am x 2 days,   2 each am x 2 days,  1 each am x 2 days and stop  Doxycycline 100 mg twice daily until done Flutter valve as needed for cough/ congestion Adjust your 02 flow to keep your sats above 90%  Please schedule a follow up office visit in 6 weeks, call sooner if needed  Add needs pneumovax on return.     01/05/2020  f/u ov/Shyna Duignan re: GOLD IV / 02 dep  Flare really Thxgiving weekend  Chief Complaint  Patient presents with   Follow-up    Patient has severe shortness of breath with exertion, gets out of breath very quick with any exertion,  congestion in his chest feels like he cant get it up.   Dyspnea:  Just getting out of chair on last day of prednisone  Cough: rattling congested cough/ still green last dose of omnicef 01/02/20  Using flutter valve and mucinex d  Sleeping: ok on side/ bed  Is flat/ 2 pillows  SABA [id number] no more than hfa 2x, now using both up to 3 x  02: 2.5 lpm hs but having to turn up to 3 POC now daytime  Rec Plan A = Automatic = Always=    trelegy  Plan B = Backup (to supplement plan A, not to replace it) Only use your albuterol inhaler as a rescue medication Plan C = Crisis (instead of Plan B but only if Plan B stops working) - only use your albuterol nebulizer if you first try Plan B  For cough / congestion max dose of mucinex 1200  Mg every 12 hours with the flutter valve  Doxycline 100 mg twice daily x 10 days  - take it before meals with glass of water  Take Prednisone 10 mg x  4 for three days 3 for three days 2 for three days 1 for three days and stop  Try prilosec otc 20mg   Take 30-60 min before first meal of the day and Pepcid ac (famotidine) 20 mg one after supper until cough is completely gone for at least a week without the need for cough suppression GERD diet reviewed, bed blocks rec  Please schedule a follow up visit in 3 months but call sooner if needed      02/14/2021  f/u ov/Gilbert Jefferson re: copd gold 4/ 02 dep  maint on trelezi  has flutter valve  Chief Complaint  Patient presents with   Follow-up    Had Covid 19 in Oct 2022.He was tx at home with paxlovid.   Dyspnea:  40min at 1 mph x flat on 3lpm tries to do total of 30 min / not checking sats/ overt HB not on gerd rx as above recs Cough: none / but wife says sounds gurgly  noct  Sleeping: adjusttable bed at 30 degrees  SABA use: increased to neb once a day x several weeks 02:  2.5 lpm,   none sitting still then up to 3lpm walking  Covid status:    vax x 2   No obvious day to day or daytime variability or assoc excess/ purulent  sputum or mucus plugs or hemoptysis or cp or chest tightness, subjective wheeze or overt sinus  symptoms.     Also denies any obvious fluctuation of symptoms with weather or environmental changes or other aggravating or alleviating factors except as outlined above   No unusual exposure hx or h/o childhood pna/ asthma or knowledge of premature birth.  Current Allergies, Complete Past Medical History, Past Surgical History, Family History, and Social History were reviewed in [employer] record.  ROS  The following are not active complaints unless bolded Hoarseness, sore throat, dysphagia, dental problems, itching, sneezing,  nasal congestion or discharge of excess mucus or purulent secretions, ear ache,   fever, chills, sweats, unintended wt loss or wt gain, classically pleuritic or exertional cp,  orthopnea pnd or arm/hand swelling  or leg swelling, presyncope, palpitations, abdominal pain, anorexia, nausea, vomiting, diarrhea  or change in bowel habits or change in bladder habits, change in stools or change in urine, dysuria, hematuria,  rash, arthralgias, visual complaints, headache, numbness, weakness or ataxia or problems with walking or coordination,  change in mood or  memory.        Current Meds  Medication Sig   albuterol (PROVENTIL) (2.5 MG/3ML) 0.083% nebulizer solution USE 1 VIAL IN NEBULIZER EVERY 6 HOURS - as needed for wheezing or shortness of breath   albuterol (VENTOLIN HFA) 108 (90 Base) MCG/ACT inhaler INHALE 1 PUFF INTO THE LUNGS EVERY 6 HOURS AS NEEDED FOR WHEEZING OR SHORTNESS OF BREATH.   aspirin EC 81 MG tablet Take 1 tablet (81 mg total) by mouth daily. Swallow whole.   atorvastatin (LIPITOR) 10 MG tablet TAKE 1 TABLET BY MOUTH EVERY DAY MUST HAVE APPT FOR REFILLS   busPIRone (BUSPAR) 15 MG tablet Take 0.5 tablets (7.5 mg total) by mouth 2 (two) times daily.   fluticasone (FLONASE) 50 MCG/ACT nasal spray Place 1 spray into both nostrils daily.    Fluticasone-Umeclidin-Vilant (TRELEGY ELLIPTA) 100-62.5-25 MCG/ACT AEPB Inhale 1 puff into the lungs daily.   guaiFENesin (MUCINEX) 600 MG 12 hr tablet Take 600 mg by mouth 2 (two) times daily.   loratadine-pseudoephedrine (CLARITIN-D 12-HOUR) 5-120 MG tablet Take 1 tablet by  mouth daily.   losartan (COZAAR) 50 MG tablet Take 50 mg by mouth in the morning and at bedtime.   OXYGEN Oxygen 2.5 lpm with sleep and as needed during the day, 3lpm with exertion   Respiratory Therapy Supplies (FLUTTER) DEVI Use as directed            Past Medical History:  Diagnosis Date   COPD (chronic obstructive pulmonary disease) (HCC)    Hypertension    Osteoarthrosis    Varicose veins        Objective:    Wts   02/14/2021       204  01/05/20 202 lb 12.8 oz (92 kg)  12/29/19 207 lb 6.4 oz (94.1 kg)  11/17/19 208 lb 12.8 oz (94.7 kg)      Vital signs reviewed  02/14/2021  - Note at rest 02 sats  96% on 2lpm pulsed   General appearance:    obese wm, somwhat congested rattling cough   HEENT : pt wearing mask not removed for exam due to covid -19 concerns.    NECK :  without JVD/Nodes/TM/ nl carotid upstrokes bilaterally   LUNGS: no acc muscle use,  Mod barrel  contour chest wall with bilateral  Distant bs s audible wheeze and  without cough on insp or exp maneuvers and mod  Hyperresonant  to  percussion bilaterally     CV:  RRR  no s3 or murmur or increase in P2, and no edema   ABD:  soft and nontender with pos mid insp Hoover's  in the supine position. No bruits or organomegaly appreciated, bowel sounds nl  MS:     ext warm without deformities, calf tenderness, cyanosis or clubbing No obvious joint restrictions   SKIN: warm and dry without lesions    NEURO:  alert, approp, nl sensorium with  no motor or cerebellar deficits apparent.            Assessment

## 2021-02-15 ENCOUNTER — Encounter: Payer: Self-pay | Admitting: Internal Medicine

## 2021-02-15 NOTE — Assessment & Plan Note (Addendum)
Exertional hypoxemia 04/10/2013  - ONO 2 lpm 01/28/2019      Presently using 3lpm with ex but not checking sats with ex  Make sure you check your oxygen saturation  AT  your highest level of activity (not after you stop)   to be sure it stays over 90% and adjust  02 flow upward to maintain this level if needed but remember to turn it back to previous settings when you stop (to conserve your supply).           Each maintenance medication was reviewed in detail including emphasizing most importantly the difference between maintenance and prns and under what circumstances the prns are to be triggered using an action plan format where appropriate.  Total time for H and P, chart review, counseling, reviewing hfa/02/neb/dpi device(s) and generating customized AVS unique to this office visit / same day charting = 32 min

## 2021-02-15 NOTE — Assessment & Plan Note (Signed)
Quit smoking  2013 at wt = 170  Spiro 10/08/12: FeV1 29% FeV1/FVC 41% Alpha one antitrypsin 04/10/2013   MM  Level 145 Spiro 04/10/2013>> FeV1 26% FVC 56% FeV1/FVC 53% - 11/12/2018  After extensive coaching inhaler device,  effectiveness =    90% hfa so try Breztri 2 bid > not on plan so resumed trelegy and flared first week in November 2020 > rx 12/24/2018 with doxy/pred x 6d  - 01/21/2019 changed back to [facility] due to UACS and rx with max gerd rx  02/19/2019 - No better on Breztri and too expensive. Would like to resume Trelegy. Referring back to pulmonary rehab. ONO on 2L stable.  05/21/2019 - He is back on trelegy d.t cost. Saw no difference compared to [place]. Has not returned to pulmonary rehab, re-refer to [facility].   - 02/14/2021 added flutter valve and maint gerd rx    02/14/2021  After extensive coaching inhaler device,  effectiveness =    75% with hfa (short Ti)   Group D in terms of symptom/risk and laba/lama/ICS  therefore appropriate rx at this point >>>  contineu trelegy and approp saba plus flutter and max gerd rx   Re SABA :  I spent extra time with pt today reviewing appropriate use of albuterol for prn use on exertion with the following points: 1) saba is for relief of sob that does not improve by walking a slower pace or resting but rather if the pt does not improve after trying this first. 2) If the pt is convinced, as many are, that saba helps recover from activity faster then it's easy to tell if this is the case by re-challenging : ie stop, take the inhaler, then p 5 minutes try the exact same activity (intensity of workload) that just caused the symptoms and see if they are substantially diminished or not after saba 3) if there is an activity that reproducibly causes the symptoms, try the saba 15 min before the activity on alternate days   If in fact the saba really does help, then fine to continue to use it prn but advised may need to look closer at the maintenance regimen  being used to achieve better control of airways disease with exertion.

## 2021-02-20 ENCOUNTER — Telehealth: Payer: Self-pay | Admitting: Internal Medicine

## 2021-02-20 MED ORDER — AZITHROMYCIN 250 MG PO TABS: ORAL_TABLET | ORAL | 0 refills | Status: AC

## 2021-02-20 NOTE — Telephone Encounter (Signed)
Zpak  - if not better will need to return with all meds in hand to regroup

## 2021-02-20 NOTE — Telephone Encounter (Signed)
Called and spoke with pt letting him know the info per MW and he verbalized understanding. Rx has been sent to preferred pharmacy. Nothing further needed.

## 2021-02-20 NOTE — Telephone Encounter (Signed)
Called and spoke with patient. He stated that he was seen by MW on 02/14/21 and was prescribed a prednisone taper. He has not noticed a different while on the prednisone. He is still having problems with SOB, especially with exertion. Increased wheezing. He denied any fevers or body aches. Does have a slight cough.   He stated that normally when he gets like this, MW prescribes an antbiotic and prednisone. He will be done with the prednisone tomorrow.   He wants to know if MW would be willing to send in an antibiotic.   Pharmacy is [facility] in [place].   MW, can you please advise? Thanks!

## 2021-03-02 ENCOUNTER — Other Ambulatory Visit: Payer: Self-pay | Admitting: Internal Medicine

## 2021-03-29 ENCOUNTER — Other Ambulatory Visit: Payer: Self-pay

## 2021-03-29 ENCOUNTER — Ambulatory Visit (INDEPENDENT_AMBULATORY_CARE_PROVIDER_SITE_OTHER): Payer: Medicare Other

## 2021-03-29 ENCOUNTER — Encounter: Payer: Self-pay | Admitting: Internal Medicine

## 2021-03-29 ENCOUNTER — Ambulatory Visit (INDEPENDENT_AMBULATORY_CARE_PROVIDER_SITE_OTHER): Payer: Medicare Other | Admitting: Internal Medicine

## 2021-03-29 DIAGNOSIS — J9611 Chronic respiratory failure with hypoxia: Secondary | ICD-10-CM | POA: Diagnosis not present

## 2021-03-29 DIAGNOSIS — J449 Chronic obstructive pulmonary disease, unspecified: Secondary | ICD-10-CM | POA: Diagnosis not present

## 2021-03-29 NOTE — Assessment & Plan Note (Addendum)
Wt 170 at smoking cessation / complicated by hbp/hyperlipidemia ? ?Body mass index is 31.26 kg/m?.  -  trending  Up slowly  ?Lab Results  ?Component Value Date  ? TSH 2.64 10/23/2016  ?  ? ? ?Contributing to doe and risk of GERD ?>>>   reviewed the need and the process to achieve and maintain neg calorie balance > defer f/u primary care including intermittently monitoring thyroid status    ? ?    ?  ? ?Each maintenance medication was reviewed in detail including emphasizing most importantly the difference between maintenance and prns and under what circumstances the prns are to be triggered using an action plan format where appropriate. ? ?Total time for H and P, chart review, counseling, reviewing hfa/dpi/elipta/02  device(s) and generating customized AVS unique to this office visit / same day charting = 32  min  ?     ?

## 2021-03-29 NOTE — Progress Notes (Signed)
? ?Gilbert Maes., male    DOB: 10/05/1950,   MRN: [id number] ? ? ?Brief patient profile:  ?21   yowm MM quit smoking 2013   With GOLD IV/Group D on 02 since around 2015  ? ?Baseline wt  170 @ quit smoking  ? ?History of Present Illness  ?11/12/2018  Pulmonary/ 1st office eval/Gilbert Jefferson  ?Chief Complaint  ?Patient presents with  ? Follow-up  ?  Breathing has been worse over the past 3 wks. He is using his albuterol inhaler 2 x per day and has not used neb.   ?Dyspnea:  Used to do treadmill at [facility]  slow speed/flat x hour daily while on 3lpm last done  in March 2020  ?Worse x 3 weeks doe, gradual  ?Cough: slt green and thick this am only, o/w very little mucus, some upper airway cough though  ?Sleep: 2 pillows, on side, bed is flat ?SABA use: twice daily plus / trelegy  ?02  Sleeps on 2.5 lpm , not using at rest or around the house, adjusts with activity out of house ?rec ?Plan A = Automatic = Always=   Breztri in place of trelegy   Take 2 puffs first thing in am and then another 2 puffs about 12 hours later.  ?Work on inhaler technique:  ?Plan B = Backup (to supplement plan A, not to replace it) ?Only use your albuterol inhaler as a rescue medication ?Plan C = Crisis (instead of Plan B but only if Plan B stops working) ?- only use your albuterol nebulizer if you first try Plan B  ?If you don't like the breztri go back to the trelegy  ?Prednisone 10 mg take  4 each am x 2 days,   2 each am x 2 days,  1 each am x 2 days and stop  ?Doxycycline 100 mg twice daily until done ?Flutter valve as needed for cough/ congestion ?Adjust your 02 flow to keep your sats above 90%  ?Please schedule a follow up office visit in 6 weeks, call sooner if needed  ?Add needs pneumovax on return. ? ? ? ?  ?  ? ? ?02/14/2021  f/u ov/Gilbert Jefferson re: copd gold 4/ 02 dep  maint on trelezi  has flutter valve  ?Chief Complaint  ?Patient presents with  ? Follow-up  ?  Had Covid 19 in Oct 2022.He was tx at home with paxlovid.   ?Dyspnea:  7min at 1 mph x flat  on 3lpm tries to do total of 30 min / not checking sats/ overt HB not on gerd rx as above recs ?Cough: none / but wife says sounds gurgly  noct  ?Sleeping: adjusttable bed at 30 degrees  ?SABA use: increased to neb once a day x several weeks ?02:  2.5 lpm,   none sitting still then up to 3lpm walking  ?Covid status:    vax x 2 ?Rec ?Take Prednisone 10 mg x  4 for three days 3 for three days 2 for three days 1 for three days and stop ?Try prilosec otc 20mg   Take 30-60 min before first meal of the day and Pepcid ac (famotidine) 20 mg one after supper until cough is completely gone for at least a week without the need for cough suppression ?GERD diet reviewed, bed blocks rec  ?Make sure you check your oxygen saturation  AT  your highest level of activity (not after you stop)   to be sure it stays over 90% and adjust  02 flow  upward to maintain this level if needed but remember to turn it back to previous settings when you stop (to conserve your supply).  ?Ok to try albuterol 15 min before an activity (on alternating days)  that you know would usually make you short of breath  ? ? ? ?03/29/2021  f/u ov/Gilbert Jefferson re: GOLD 4/ 02 dep hs and ex   maint on trelegy   ?Chief Complaint  ?Patient presents with  ? Follow-up  ?  Follow up. Patient says his breathing hasn't gotten better.   ? Dyspnea:  teadmill x 10 min s stopping flat 3lpm s checking on level #1  ?Cough: none  ?Sleeping: 30 degrees hob  ?SABA use: once a day never pre-challenges as rec  ?02: 2.5 lpm hs,  with ex 3lpm and none at rest / not titrating as rec  ?  ? ? ?No obvious day to day or daytime variability or assoc excess/ purulent sputum or mucus plugs or hemoptysis or cp or chest tightness, subjective wheeze or overt sinus or hb symptoms.  ? ?Sleeping  without nocturnal  or early am exacerbation  of respiratory  c/o's or need for noct saba. Also denies any obvious fluctuation of symptoms with weather or environmental changes or other aggravating or alleviating  factors except as outlined above  ? ?No unusual exposure hx or h/o childhood pna/ asthma or knowledge of premature birth. ? ?Current Allergies, Complete Past Medical History, Past Surgical History, Family History, and Social History were reviewed in [employer] record. ? ?ROS  The following are not active complaints unless bolded ?Hoarseness, sore throat, dysphagia, dental problems, itching, sneezing,  nasal congestion or discharge of excess mucus or purulent secretions, ear ache,   fever, chills, sweats, unintended wt loss or wt gain, classically pleuritic or exertional cp,  orthopnea pnd or arm/hand swelling  or leg swelling, presyncope, palpitations, abdominal pain, anorexia, nausea, vomiting, diarrhea  or change in bowel habits or change in bladder habits, change in stools or change in urine, dysuria, hematuria,  rash, arthralgias, visual complaints, headache, numbness, weakness or ataxia or problems with walking or coordination,  change in mood or  memory. ?      ? ?Current Meds  ?Medication Sig  ? albuterol (PROVENTIL) (2.5 MG/3ML) 0.083% nebulizer solution USE 1 VIAL IN NEBULIZER EVERY 6 HOURS - as needed for wheezing or shortness of breath  ? albuterol (VENTOLIN HFA) 108 (90 Base) MCG/ACT inhaler INHALE 1 PUFF INTO THE LUNGS EVERY 6 HOURS AS NEEDED FOR WHEEZING OR SHORTNESS OF BREATH.  ? aspirin EC 81 MG tablet Take 1 tablet (81 mg total) by mouth daily. Swallow whole.  ? atorvastatin (LIPITOR) 10 MG tablet TAKE 1 TABLET BY MOUTH EVERY DAY MUST HAVE APPT FOR REFILLS  ? busPIRone (BUSPAR) 15 MG tablet Take 0.5 tablets (7.5 mg total) by mouth 2 (two) times daily.  ? fluticasone (FLONASE) 50 MCG/ACT nasal spray Place 1 spray into both nostrils daily.  ? Fluticasone-Umeclidin-Vilant (TRELEGY ELLIPTA) 100-62.5-25 MCG/ACT AEPB TAKE 1 PUFF BY MOUTH EVERY DAY  ? loratadine-pseudoephedrine (CLARITIN-D 12-HOUR) 5-120 MG tablet Take 1 tablet by mouth daily.  ? losartan (COZAAR) 50 MG tablet Take  50 mg by mouth in the morning and at bedtime.  ? OXYGEN Oxygen 2.5 lpm with sleep and as needed during the day, 3lpm with exertion  ? Respiratory Therapy Supplies (FLUTTER) DEVI Use as directed  ?    ? ?  ? ? ?  ? ? ?Past Medical  History:  ?Diagnosis Date  ? COPD (chronic obstructive pulmonary disease) ([place])   ? Hypertension   ? Osteoarthrosis   ? Varicose veins   ? ? ?  ? ?Objective:  ?  ?Wts ? ?03/29/2021         205  ?02/14/2021       204  ?01/05/20 202 lb 12.8 oz (92 kg)  ?12/29/19 207 lb 6.4 oz (94.1 kg)  ?11/17/19 208 lb 12.8 oz (94.7 kg)  ?  ?Vital signs reviewed  03/29/2021  - Note at rest 02 sats  97% on RA  ? ?General appearance:    amb obese wm nad   ? ?HEENT : pt wearing mask not removed for exam due to covid -19 concerns.  ? ? ?NECK :  without JVD/Nodes/TM/ nl carotid upstrokes bilaterally ? ? ?LUNGS: no acc muscle use,  Mod barrel  contour chest wall with bilateral  Distant bs s audible wheeze and  without cough on insp or exp maneuvers and mod  Hyperresonant  to  percussion bilaterally   ? ? ?CV:  RRR  no s3 or murmur or increase in P2, and no edema  ? ?ABD:pot belly contour/ soft and nontender with pos mid insp Hoover's  in the supine position. No bruits or organomegaly appreciated, bowel sounds nl ? ?MS:     ext warm without deformities, calf tenderness, cyanosis or clubbing ?No obvious joint restrictions  ? ?SKIN: warm and dry without lesions   ? ?NEURO:  alert, approp, nl sensorium with  no motor or cerebellar deficits apparent.  ?    ? ? CXR PA and Lateral:   03/29/2021 :    ?I personally reviewed images and impression is as follows:     ?Mod copd/ no acute changes   ?Assessment  ? ?  ?  ?

## 2021-03-29 NOTE — Patient Instructions (Addendum)
Ok to try albuterol x 2 puffs x  15 min before an activity (on alternating days)  that you know would usually make you short of breath and see if it makes any difference and if makes none then don't take albuterol after activity unless you can't catch your breath as this means it's the resting that helps, not the albuterol. ? ?Make sure you check your oxygen saturation  AT  your highest level of activity (not after you stop)   to be sure it stays over 90% and adjust  02 flow upward to maintain this level if needed but remember to turn it back to previous settings when you stop (to conserve your supply).  ? ?Please remember to go to the  x-ray department  for your tests - we will call you with the results when they are available   ? ?Please schedule a follow up visit in 3 months but call sooner if needed. ? ? ?    ?

## 2021-03-29 NOTE — Assessment & Plan Note (Signed)
Exertional hypoxemia 04/10/2013  ?- ONO 2 lpm 01/28/2019   ?- as of 03/29/2021  2.5 lpm hs and adjust daytime rest vs  ex to maintain > 90%  ? ?Advised: ?Make sure you check your oxygen saturation  AT  your highest level of activity (not after you stop)   to be sure it stays over 90% and adjust  02 flow upward to maintain this level if needed but remember to turn it back to previous settings when you stop (to conserve your supply).  ?

## 2021-03-29 NOTE — Assessment & Plan Note (Signed)
Quit smoking  2013 at wt = 170  ?Arlyce Harman 10/08/12: FeV1 29% FeV1/FVC 41% ?Alpha one antitrypsin 04/10/2013   MM  Level 145 ?Arlyce Harman 04/10/2013>> FeV1 26% FVC 56% FeV1/FVC 53% ?- 11/12/2018  After extensive coaching inhaler device,  effectiveness =    90% hfa so try Breztri 2 bid > not on plan so resumed trelegy and flared first week in November 2020 > rx 12/24/2018 with doxy/pred x 6d  ?- 01/21/2019 changed back to [facility] due to UACS and rx with max gerd rx  ?02/19/2019 - No better on Breztri and too expensive. Would like to resume Trelegy. Referring back to pulmonary rehab. ONO on 2L stable.  ?05/21/2019 - He is back on trelegy d.t cost. Saw no difference compared to [place]. Has not returned to pulmonary rehab, re-refer to [facility].   ?- 02/14/2021 added flutter valve and maint gerd rx  ?- 03/29/2021  After extensive coaching inhaler device,  effectiveness =  90%   ? ? ? Group D in terms of symptom/risk and laba/lama/ICS  therefore appropriate rx at this point >>>  Continue trelegy and approp saba ? ?Re SABA :  I spent extra time with pt today reviewing appropriate use of albuterol for prn use on exertion with the following points: ?1) saba is for relief of sob that does not improve by walking a slower pace or resting but rather if the pt does not improve after trying this first. ?2) If the pt is convinced, as many are, that saba helps recover from activity faster then it's easy to tell if this is the case by re-challenging : ie stop, take the inhaler, then p 5 minutes try the exact same activity (intensity of workload) that just caused the symptoms and see if they are substantially diminished or not after saba ?3) if there is an activity that reproducibly causes the symptoms, try the saba 15 min before the activity on alternate days  ? ?If in fact the saba really does help, then fine to continue to use it prn but advised may need to look closer at the maintenance regimen being used to achieve better control of airways  disease with exertion.  ? ?  ?

## 2021-03-31 ENCOUNTER — Other Ambulatory Visit: Payer: Self-pay | Admitting: Internal Medicine

## 2021-04-24 IMAGING — DX DG CHEST 2V
2 series · 2 of 2 positions shown · non-contrast
Comparison: November 12, 2018

CLINICAL DATA: Cough.

EXAM:
CHEST - 2 VIEW

[chest pa]
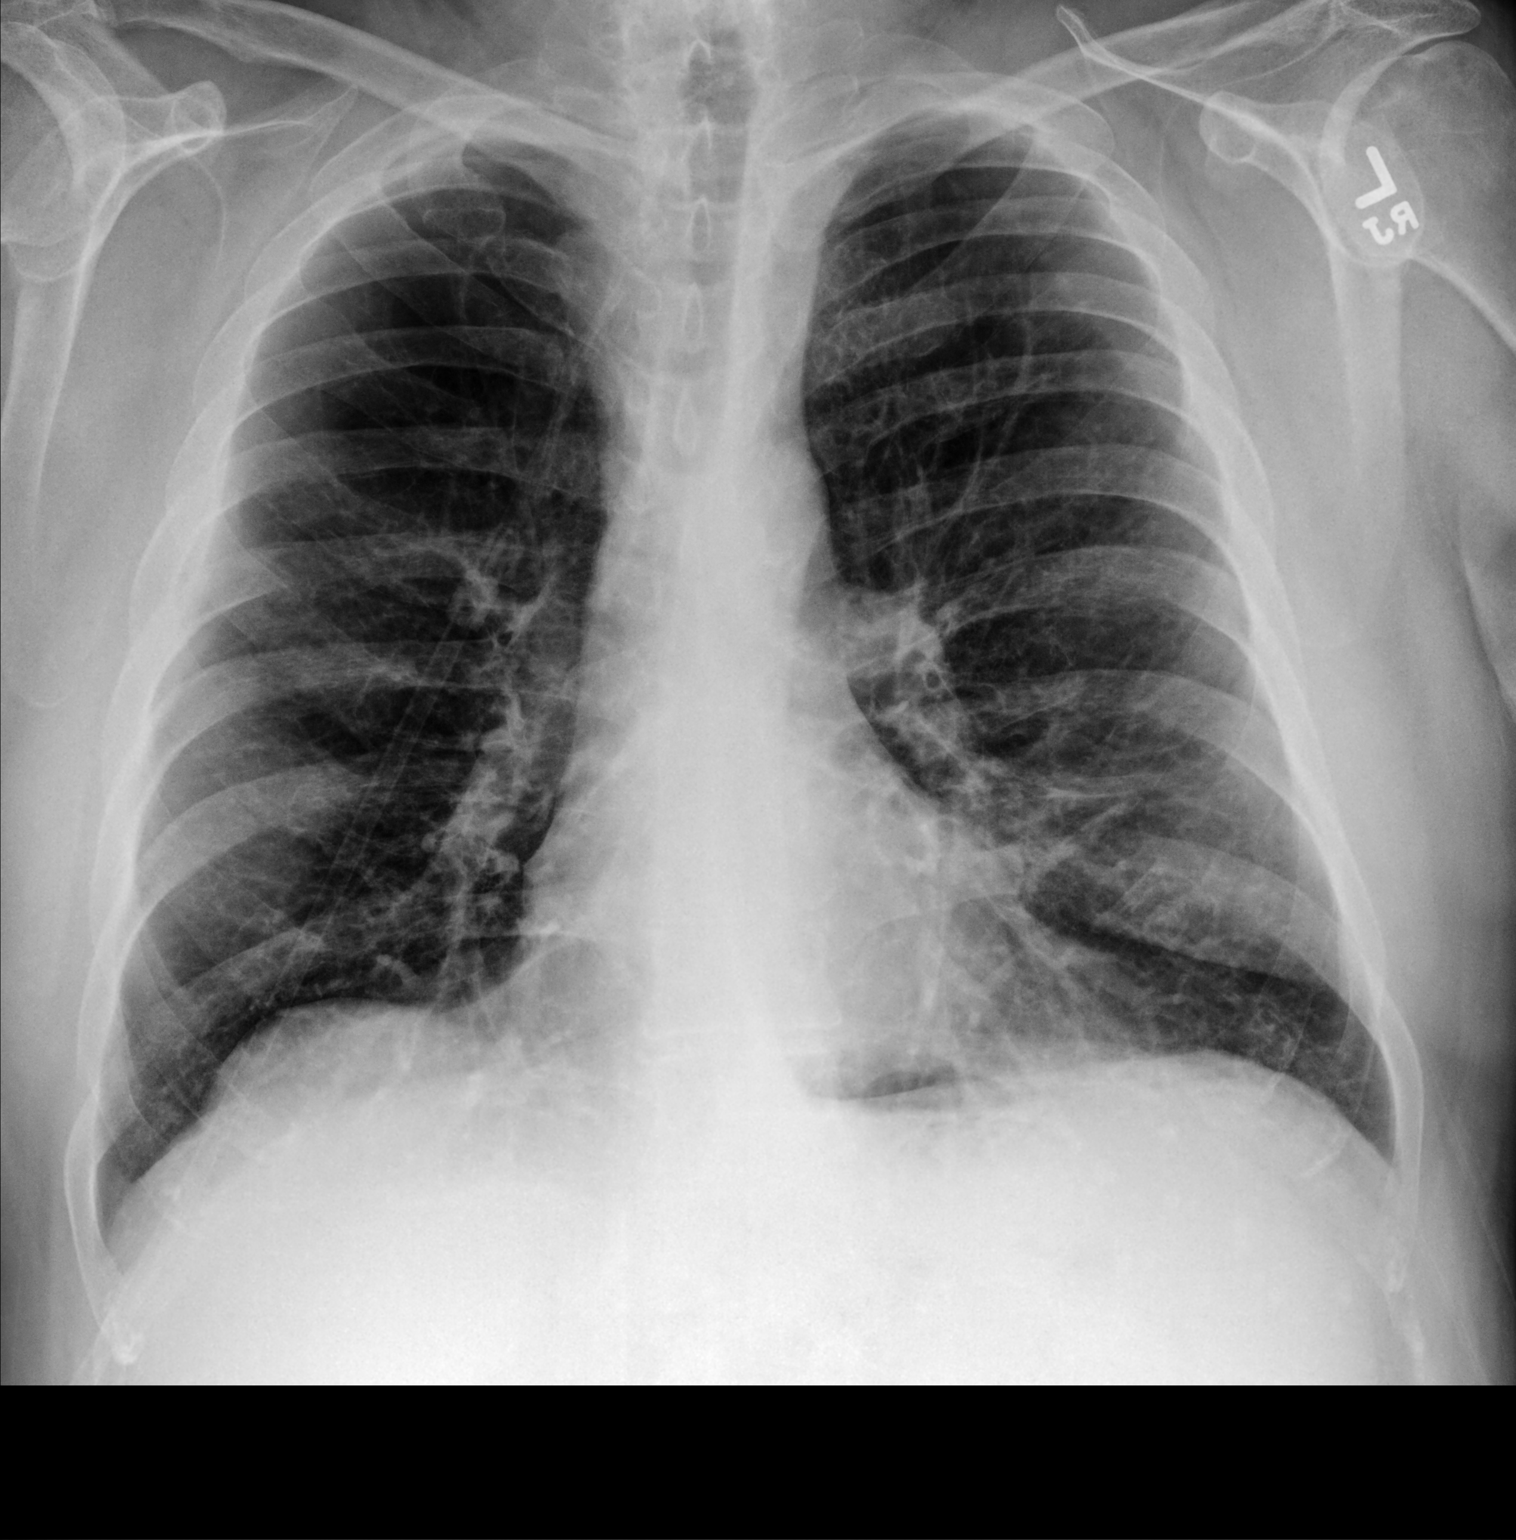

[chest lat]
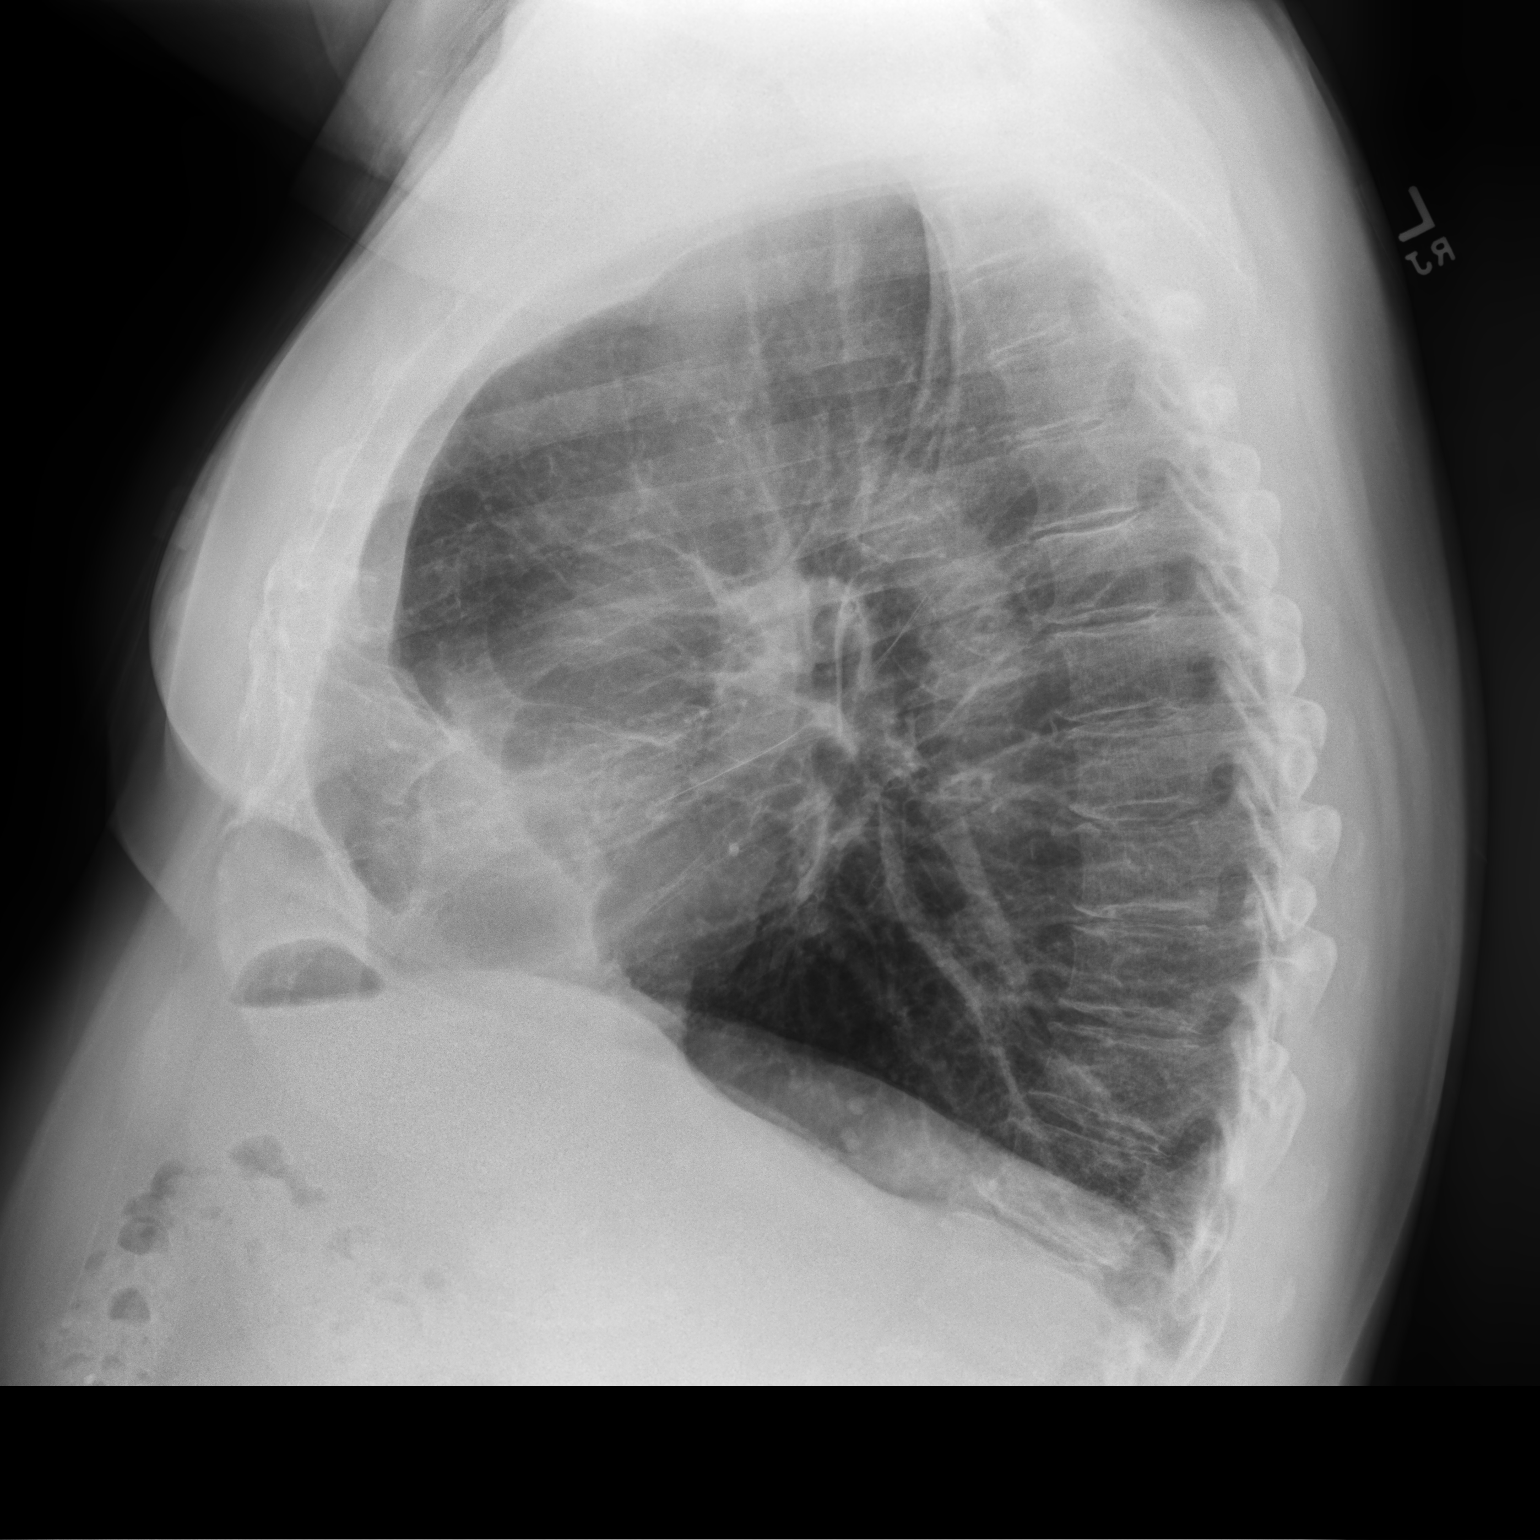

[2 of 2 positions shown; findings below may reference images not displayed]

FINDINGS: The lungs are hyperinflated. Mild, chronic appearing increased
interstitial lung markings are seen. Mild, stable central airway
thickening is noted. There is no evidence of a pleural effusion or
pneumothorax. The heart size and mediastinal contours are within
normal limits. The visualized skeletal structures are unremarkable.
IMPRESSION: 1. Stable exam without active cardiopulmonary disease.

## 2021-06-02 ENCOUNTER — Other Ambulatory Visit: Payer: Self-pay | Admitting: Internal Medicine

## 2021-06-05 IMAGING — DX DG CHEST 2V
2 series · 2 of 2 positions shown · non-contrast
Comparison: PA and lateral chest 11/17/2019 06/04/2016.

CLINICAL DATA: Hemoptysis.  History of COPD.

EXAM:
CHEST - 2 VIEW

[chest pa]
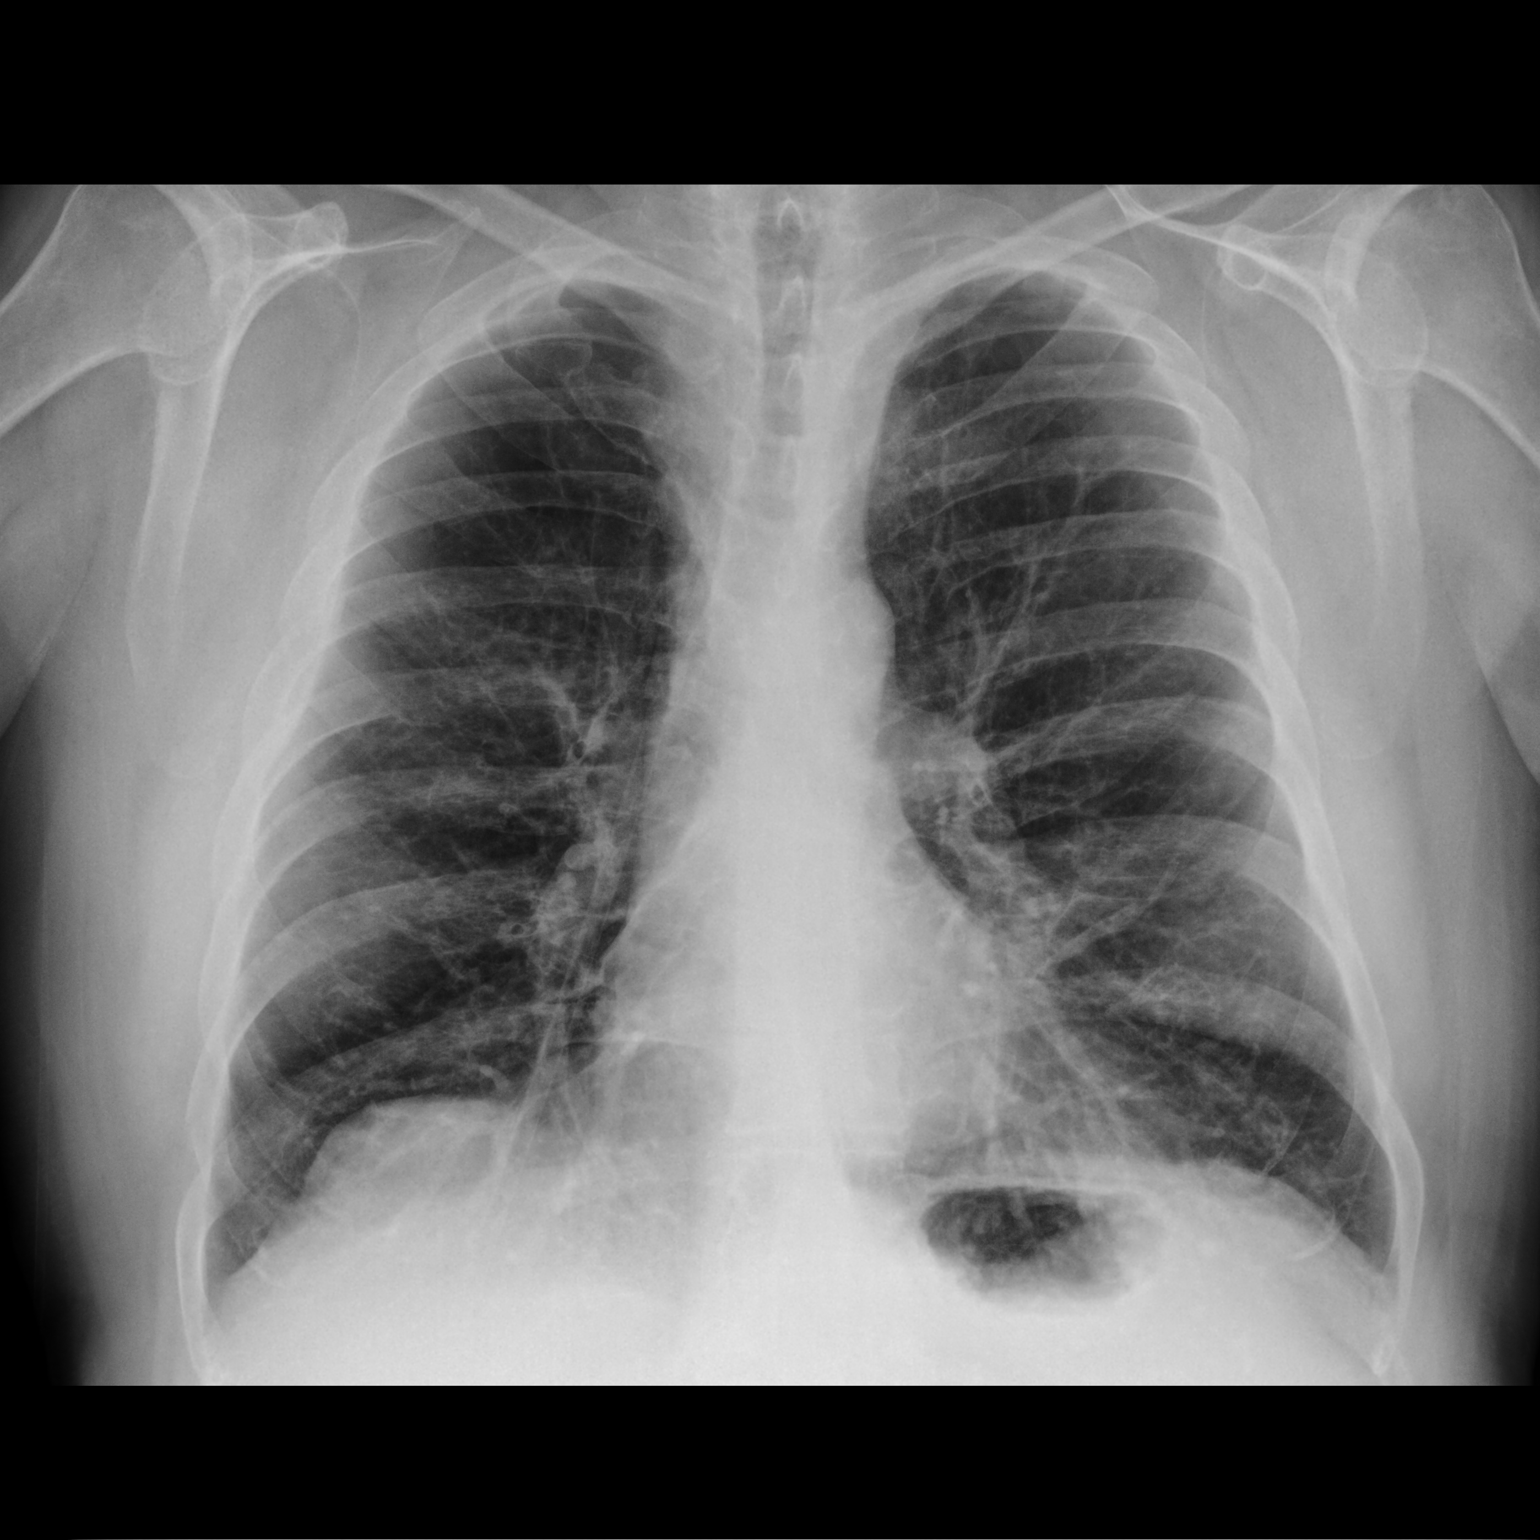

[chest lat]
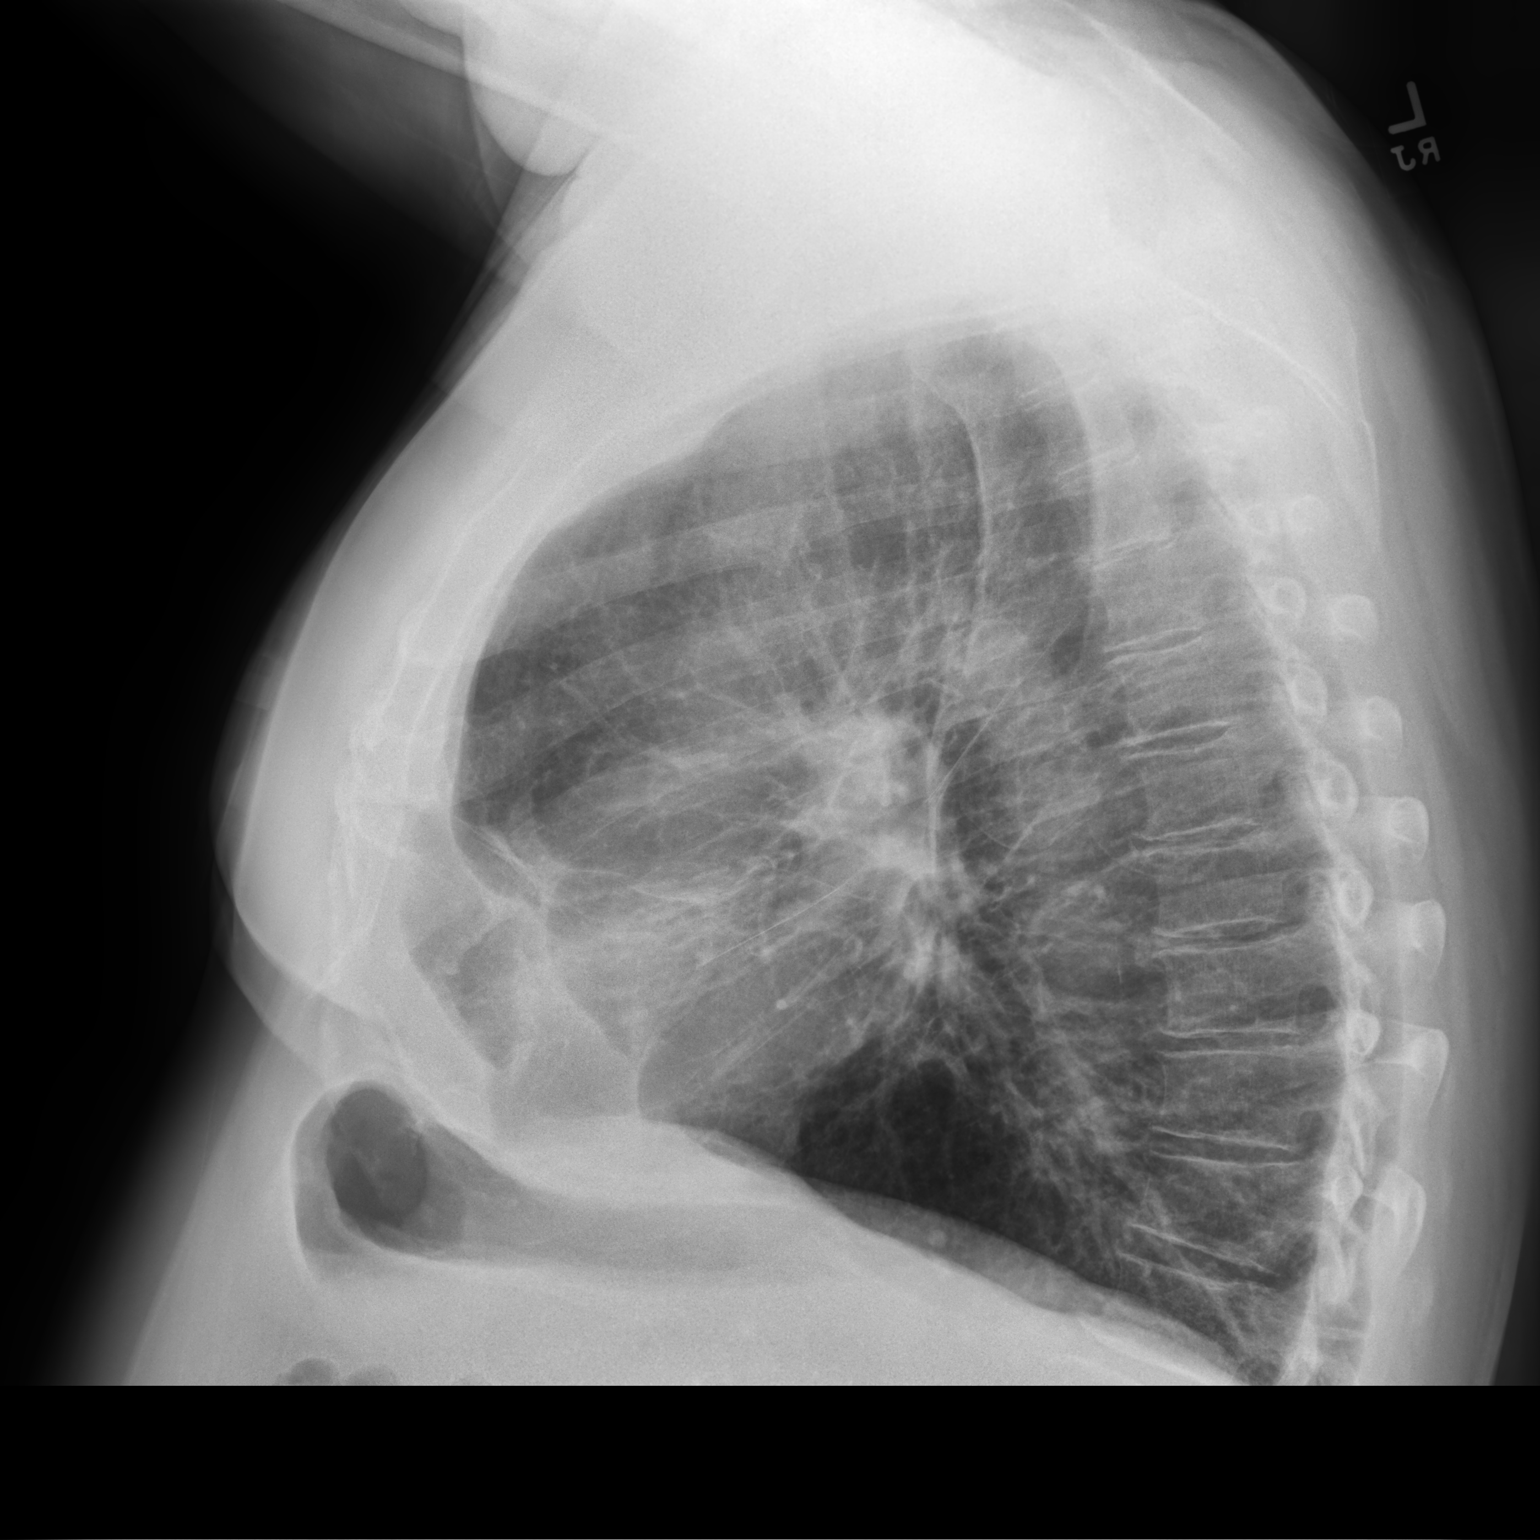

[2 of 2 positions shown; findings below may reference images not displayed]

FINDINGS: The chest is hyperexpanded with attenuation of the pulmonary
vasculature. Lungs are clear. No pneumothorax or pleural fluid.
Heart size is normal. No acute or focal bony abnormality.
IMPRESSION: No acute disease.

Emphysema ([id number]).

## 2021-08-07 ENCOUNTER — Other Ambulatory Visit: Payer: Self-pay | Admitting: *Deleted

## 2021-08-07 MED ORDER — ALBUTEROL SULFATE HFA 108 (90 BASE) MCG/ACT IN AERS: 1.0000 | INHALATION_SPRAY | Freq: Four times a day (QID) | RESPIRATORY_TRACT | 5 refills | Status: DC | PRN

## 2021-10-03 ENCOUNTER — Telehealth: Payer: Self-pay | Admitting: Internal Medicine

## 2021-10-03 MED ORDER — DOXYCYCLINE HYCLATE 100 MG PO TABS: 100.0000 mg | ORAL_TABLET | Freq: Two times a day (BID) | ORAL | 0 refills | Status: AC

## 2021-10-03 NOTE — Telephone Encounter (Signed)
Called and spoke with patient.  Patient stated he started having a cough yesterday that has become productive today.  Patient stated he is coughing thick, yellow to green colored sputum.  Patient stated he has had no fever or chills.  Patient stated his sob is not changed from his normal. Patient is requesting an antibiotic to be sent to [facility].  LOV 03/29/21- Dr. Melvyn Novas  Instructions  Ok to try albuterol x 2 puffs x  15 min before an activity (on alternating days)  that you know would usually make you short of breath and see if it makes any difference and if makes none then don't take albuterol after activity unless you can't catch your breath as this means it's the resting that helps, not the albuterol.   Make sure you check your oxygen saturation  AT  your highest level of activity (not after you stop)   to be sure it stays over 90% and adjust  02 flow upward to maintain this level if needed but remember to turn it back to previous settings when you stop (to conserve your supply).    Please remember to go to the  x-ray department  for your tests - we will call you with the results when they are available     Please schedule a follow up visit in 3 months but call sooner if needed.      Message routed to Dr. Melvyn Novas to advise

## 2021-10-03 NOTE — Telephone Encounter (Signed)
Called and spoke with patient.  Dr. Gustavus Bryant recommendations given.  Understanding stated. Doxycycline prescription sent to requested [facility]. Nothing further at this time.

## 2021-10-03 NOTE — Telephone Encounter (Signed)
Doxycycline 100 mg bid x 7 days, stay out of sun

## 2021-11-17 ENCOUNTER — Telehealth: Payer: Self-pay | Admitting: Internal Medicine

## 2021-11-20 MED ORDER — ALBUTEROL SULFATE (2.5 MG/3ML) 0.083% IN NEBU: INHALATION_SOLUTION | RESPIRATORY_TRACT | 11 refills | Status: DC

## 2021-11-20 NOTE — Telephone Encounter (Signed)
Called and spoke to patient and him wanting his albuterol neb refilled. He states he needed it sent into [facility]. Order placed. Nothing further needed

## 2022-01-18 ENCOUNTER — Other Ambulatory Visit: Payer: Self-pay | Admitting: *Deleted

## 2022-01-18 MED ORDER — ALBUTEROL SULFATE HFA 108 (90 BASE) MCG/ACT IN AERS: 1.0000 | INHALATION_SPRAY | Freq: Four times a day (QID) | RESPIRATORY_TRACT | 3 refills | Status: AC | PRN

## 2022-01-18 MED ORDER — TRELEGY ELLIPTA 100-62.5-25 MCG/ACT IN AEPB: 1.0000 | INHALATION_SPRAY | Freq: Every day | RESPIRATORY_TRACT | 3 refills | Status: AC

## 2022-02-09 ENCOUNTER — Telehealth: Payer: Self-pay | Admitting: Internal Medicine

## 2022-02-09 DIAGNOSIS — J449 Chronic obstructive pulmonary disease, unspecified: Secondary | ICD-10-CM

## 2022-02-09 NOTE — Telephone Encounter (Signed)
Called and spoke with patient. He is aware that [facility] is ok with the referral. Referral has been placed.   Nothing further needed at time of call.

## 2022-02-09 NOTE — Telephone Encounter (Signed)
Pt called stating that he has moved to [place], [place] and is needing a referral to be sent to a [place] for him to be established with a new pulmonologist.   Pt has found a location called [facility] with Dr. Tommie Raymond, pulmonologist.  This referral will need to be faxed to [phone].  Dr. Melvyn Novas, please advise on this for pt.

## 2022-02-09 NOTE — Telephone Encounter (Signed)
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

## 2022-09-21 ENCOUNTER — Other Ambulatory Visit: Payer: Self-pay | Admitting: Internal Medicine

## 2023-02-06 ENCOUNTER — Other Ambulatory Visit: Payer: Self-pay | Admitting: Internal Medicine
